# Patient Record
Sex: Female | Born: 1940 | Race: White | Hispanic: No | Smoking: Former smoker
Health system: Southern US, Community
[De-identification: ages and names within clinical notes are randomized; demographics above are authoritative.]

## PROBLEM LIST (undated history)

## (undated) DIAGNOSIS — Z98811 Dental restoration status: Secondary | ICD-10-CM

## (undated) DIAGNOSIS — C801 Malignant (primary) neoplasm, unspecified: Secondary | ICD-10-CM

## (undated) DIAGNOSIS — E7800 Pure hypercholesterolemia, unspecified: Secondary | ICD-10-CM

## (undated) DIAGNOSIS — Z923 Personal history of irradiation: Secondary | ICD-10-CM

## (undated) DIAGNOSIS — C50919 Malignant neoplasm of unspecified site of unspecified female breast: Secondary | ICD-10-CM

## (undated) DIAGNOSIS — M545 Low back pain, unspecified: Secondary | ICD-10-CM

## (undated) DIAGNOSIS — M542 Cervicalgia: Secondary | ICD-10-CM

## (undated) DIAGNOSIS — L719 Rosacea, unspecified: Secondary | ICD-10-CM

## (undated) DIAGNOSIS — T4145XA Adverse effect of unspecified anesthetic, initial encounter: Secondary | ICD-10-CM

## (undated) DIAGNOSIS — M1990 Unspecified osteoarthritis, unspecified site: Secondary | ICD-10-CM

## (undated) DIAGNOSIS — H9390 Unspecified disorder of ear, unspecified ear: Secondary | ICD-10-CM

## (undated) DIAGNOSIS — M546 Pain in thoracic spine: Secondary | ICD-10-CM

## (undated) DIAGNOSIS — K219 Gastro-esophageal reflux disease without esophagitis: Secondary | ICD-10-CM

## (undated) DIAGNOSIS — M79602 Pain in left arm: Secondary | ICD-10-CM

## (undated) DIAGNOSIS — R350 Frequency of micturition: Secondary | ICD-10-CM

## (undated) HISTORY — DX: Pain in thoracic spine: M54.6

## (undated) HISTORY — DX: Rosacea, unspecified: L71.9

## (undated) HISTORY — PX: TONSILLECTOMY: SUR1361

## (undated) HISTORY — PX: CERVICAL FUSION: SHX112

## (undated) HISTORY — DX: Low back pain, unspecified: M54.50

## (undated) HISTORY — PX: REPLACEMENT TOTAL KNEE: SUR1224

## (undated) HISTORY — DX: Low back pain: M54.5

## (undated) HISTORY — DX: Cervicalgia: M54.2

## (undated) HISTORY — PX: ROTATOR CUFF REPAIR: SHX139

## (undated) HISTORY — DX: Pure hypercholesterolemia, unspecified: E78.00

## (undated) HISTORY — DX: Pain in left arm: M79.602

## (undated) HISTORY — PX: JOINT REPLACEMENT: SHX530

---

## 1968-11-21 HISTORY — PX: ABDOMINAL HYSTERECTOMY: SHX81

## 2000-01-01 ENCOUNTER — Emergency Department (HOSPITAL_COMMUNITY): Admission: EM | Admit: 2000-01-01 | Discharge: 2000-01-01 | Payer: Self-pay | Admitting: Emergency Medicine

## 2000-01-01 ENCOUNTER — Encounter: Payer: Self-pay | Admitting: Emergency Medicine

## 2000-09-29 ENCOUNTER — Encounter: Admission: RE | Admit: 2000-09-29 | Discharge: 2000-09-29 | Payer: Self-pay | Admitting: Gastroenterology

## 2000-09-29 ENCOUNTER — Encounter: Payer: Self-pay | Admitting: Gastroenterology

## 2001-04-11 ENCOUNTER — Encounter: Admission: RE | Admit: 2001-04-11 | Discharge: 2001-07-10 | Payer: Self-pay | Admitting: Family Medicine

## 2003-07-11 ENCOUNTER — Ambulatory Visit (HOSPITAL_COMMUNITY): Admission: RE | Admit: 2003-07-11 | Discharge: 2003-07-11 | Payer: Self-pay | Admitting: Internal Medicine

## 2003-07-11 ENCOUNTER — Encounter: Payer: Self-pay | Admitting: Internal Medicine

## 2003-09-23 ENCOUNTER — Ambulatory Visit (HOSPITAL_COMMUNITY): Admission: RE | Admit: 2003-09-23 | Discharge: 2003-09-23 | Payer: Self-pay | Admitting: Family Medicine

## 2004-05-17 ENCOUNTER — Ambulatory Visit (HOSPITAL_COMMUNITY): Admission: RE | Admit: 2004-05-17 | Discharge: 2004-05-17 | Payer: Self-pay | Admitting: Internal Medicine

## 2004-07-30 ENCOUNTER — Ambulatory Visit: Payer: Self-pay | Admitting: Family Medicine

## 2004-08-03 ENCOUNTER — Ambulatory Visit: Payer: Self-pay | Admitting: Family Medicine

## 2004-08-13 ENCOUNTER — Encounter: Admission: RE | Admit: 2004-08-13 | Discharge: 2004-08-13 | Payer: Self-pay | Admitting: Family Medicine

## 2004-08-25 ENCOUNTER — Ambulatory Visit: Payer: Self-pay | Admitting: *Deleted

## 2004-10-18 ENCOUNTER — Ambulatory Visit: Payer: Self-pay | Admitting: Family Medicine

## 2004-10-19 ENCOUNTER — Ambulatory Visit (HOSPITAL_COMMUNITY): Admission: RE | Admit: 2004-10-19 | Discharge: 2004-10-19 | Payer: Self-pay | Admitting: Internal Medicine

## 2004-10-22 ENCOUNTER — Ambulatory Visit: Payer: Self-pay | Admitting: Family Medicine

## 2004-11-02 ENCOUNTER — Ambulatory Visit: Payer: Self-pay | Admitting: Family Medicine

## 2004-11-17 ENCOUNTER — Ambulatory Visit (HOSPITAL_BASED_OUTPATIENT_CLINIC_OR_DEPARTMENT_OTHER): Admission: RE | Admit: 2004-11-17 | Discharge: 2004-11-17 | Payer: Self-pay | Admitting: Orthopedic Surgery

## 2004-11-17 ENCOUNTER — Ambulatory Visit (HOSPITAL_COMMUNITY): Admission: RE | Admit: 2004-11-17 | Discharge: 2004-11-17 | Payer: Self-pay | Admitting: Orthopedic Surgery

## 2004-11-21 DIAGNOSIS — T8859XA Other complications of anesthesia, initial encounter: Secondary | ICD-10-CM

## 2004-11-21 HISTORY — DX: Other complications of anesthesia, initial encounter: T88.59XA

## 2004-11-26 ENCOUNTER — Encounter: Admission: RE | Admit: 2004-11-26 | Discharge: 2004-12-15 | Payer: Self-pay | Admitting: Orthopedic Surgery

## 2005-01-19 ENCOUNTER — Ambulatory Visit (HOSPITAL_COMMUNITY): Admission: RE | Admit: 2005-01-19 | Discharge: 2005-01-19 | Payer: Self-pay | Admitting: Orthopedic Surgery

## 2005-01-25 ENCOUNTER — Ambulatory Visit: Payer: Self-pay | Admitting: Family Medicine

## 2005-02-09 ENCOUNTER — Ambulatory Visit (HOSPITAL_BASED_OUTPATIENT_CLINIC_OR_DEPARTMENT_OTHER): Admission: RE | Admit: 2005-02-09 | Discharge: 2005-02-09 | Payer: Self-pay | Admitting: Orthopedic Surgery

## 2005-03-02 ENCOUNTER — Encounter: Admission: RE | Admit: 2005-03-02 | Discharge: 2005-05-18 | Payer: Self-pay | Admitting: Orthopedic Surgery

## 2005-05-02 ENCOUNTER — Ambulatory Visit: Payer: Self-pay | Admitting: Family Medicine

## 2005-05-09 ENCOUNTER — Emergency Department (HOSPITAL_COMMUNITY): Admission: EM | Admit: 2005-05-09 | Discharge: 2005-05-10 | Payer: Self-pay | Admitting: *Deleted

## 2005-05-10 ENCOUNTER — Ambulatory Visit: Payer: Self-pay | Admitting: Family Medicine

## 2005-06-28 ENCOUNTER — Ambulatory Visit (HOSPITAL_COMMUNITY): Admission: RE | Admit: 2005-06-28 | Discharge: 2005-06-28 | Payer: Self-pay | Admitting: Gastroenterology

## 2005-09-14 ENCOUNTER — Encounter: Admission: RE | Admit: 2005-09-14 | Discharge: 2005-09-14 | Payer: Self-pay | Admitting: Internal Medicine

## 2005-09-20 ENCOUNTER — Encounter: Admission: RE | Admit: 2005-09-20 | Discharge: 2005-09-20 | Payer: Self-pay | Admitting: Internal Medicine

## 2006-01-02 ENCOUNTER — Inpatient Hospital Stay (HOSPITAL_COMMUNITY): Admission: RE | Admit: 2006-01-02 | Discharge: 2006-01-07 | Payer: Self-pay | Admitting: Orthopedic Surgery

## 2006-04-19 ENCOUNTER — Encounter: Admission: RE | Admit: 2006-04-19 | Discharge: 2006-04-19 | Payer: Self-pay | Admitting: Internal Medicine

## 2006-09-27 ENCOUNTER — Encounter: Admission: RE | Admit: 2006-09-27 | Discharge: 2006-09-27 | Payer: Self-pay | Admitting: Gastroenterology

## 2006-10-24 ENCOUNTER — Encounter: Admission: RE | Admit: 2006-10-24 | Discharge: 2006-10-24 | Payer: Self-pay | Admitting: Internal Medicine

## 2007-10-12 ENCOUNTER — Encounter: Admission: RE | Admit: 2007-10-12 | Discharge: 2007-10-12 | Payer: Self-pay | Admitting: Internal Medicine

## 2007-12-31 ENCOUNTER — Encounter: Admission: RE | Admit: 2007-12-31 | Discharge: 2008-02-01 | Payer: Self-pay | Admitting: Sports Medicine

## 2008-05-06 ENCOUNTER — Encounter: Admission: RE | Admit: 2008-05-06 | Discharge: 2008-05-06 | Payer: Self-pay | Admitting: Sports Medicine

## 2008-05-17 ENCOUNTER — Encounter: Admission: RE | Admit: 2008-05-17 | Discharge: 2008-05-17 | Payer: Self-pay | Admitting: Sports Medicine

## 2008-07-11 ENCOUNTER — Encounter
Admission: RE | Admit: 2008-07-11 | Discharge: 2008-07-14 | Payer: Self-pay | Admitting: Physical Medicine and Rehabilitation

## 2008-07-14 ENCOUNTER — Ambulatory Visit: Payer: Self-pay | Admitting: Physical Medicine and Rehabilitation

## 2008-07-14 ENCOUNTER — Encounter: Admission: RE | Admit: 2008-07-14 | Discharge: 2008-10-12 | Payer: Self-pay | Admitting: Anesthesiology

## 2008-07-15 ENCOUNTER — Ambulatory Visit: Payer: Self-pay | Admitting: Anesthesiology

## 2008-07-29 ENCOUNTER — Ambulatory Visit: Payer: Self-pay | Admitting: Anesthesiology

## 2008-08-26 ENCOUNTER — Ambulatory Visit: Payer: Self-pay | Admitting: Anesthesiology

## 2008-09-23 ENCOUNTER — Ambulatory Visit: Payer: Self-pay | Admitting: Anesthesiology

## 2008-10-21 ENCOUNTER — Encounter
Admission: RE | Admit: 2008-10-21 | Discharge: 2009-01-19 | Payer: Self-pay | Admitting: Physical Medicine and Rehabilitation

## 2008-10-22 ENCOUNTER — Ambulatory Visit: Payer: Self-pay | Admitting: Physical Medicine and Rehabilitation

## 2008-11-03 ENCOUNTER — Encounter: Admission: RE | Admit: 2008-11-03 | Discharge: 2008-11-04 | Payer: Self-pay | Admitting: Anesthesiology

## 2008-11-04 ENCOUNTER — Ambulatory Visit: Payer: Self-pay | Admitting: Anesthesiology

## 2008-12-05 ENCOUNTER — Ambulatory Visit: Payer: Self-pay | Admitting: Physical Medicine and Rehabilitation

## 2008-12-05 ENCOUNTER — Ambulatory Visit (HOSPITAL_COMMUNITY)
Admission: RE | Admit: 2008-12-05 | Discharge: 2008-12-05 | Payer: Self-pay | Admitting: Physical Medicine and Rehabilitation

## 2008-12-25 ENCOUNTER — Encounter: Admission: RE | Admit: 2008-12-25 | Discharge: 2008-12-25 | Payer: Self-pay | Admitting: Internal Medicine

## 2009-01-09 ENCOUNTER — Ambulatory Visit: Payer: Self-pay | Admitting: Physical Medicine and Rehabilitation

## 2009-01-14 ENCOUNTER — Encounter
Admission: RE | Admit: 2009-01-14 | Discharge: 2009-03-26 | Payer: Self-pay | Admitting: Physical Medicine and Rehabilitation

## 2009-02-10 ENCOUNTER — Encounter
Admission: RE | Admit: 2009-02-10 | Discharge: 2009-05-11 | Payer: Self-pay | Admitting: Physical Medicine and Rehabilitation

## 2009-02-12 ENCOUNTER — Ambulatory Visit: Payer: Self-pay | Admitting: Physical Medicine and Rehabilitation

## 2009-04-02 ENCOUNTER — Ambulatory Visit: Payer: Self-pay | Admitting: Physical Medicine and Rehabilitation

## 2009-05-26 ENCOUNTER — Encounter
Admission: RE | Admit: 2009-05-26 | Discharge: 2009-07-24 | Payer: Self-pay | Admitting: Physical Medicine and Rehabilitation

## 2009-05-29 ENCOUNTER — Ambulatory Visit: Payer: Self-pay | Admitting: Physical Medicine and Rehabilitation

## 2009-07-24 ENCOUNTER — Ambulatory Visit: Payer: Self-pay | Admitting: Physical Medicine and Rehabilitation

## 2009-10-14 ENCOUNTER — Encounter
Admission: RE | Admit: 2009-10-14 | Discharge: 2009-11-11 | Payer: Self-pay | Admitting: Physical Medicine and Rehabilitation

## 2009-10-19 ENCOUNTER — Ambulatory Visit: Payer: Self-pay | Admitting: Physical Medicine and Rehabilitation

## 2010-02-11 ENCOUNTER — Encounter
Admission: RE | Admit: 2010-02-11 | Discharge: 2010-02-15 | Payer: Self-pay | Admitting: Physical Medicine and Rehabilitation

## 2010-02-15 ENCOUNTER — Ambulatory Visit: Payer: Self-pay | Admitting: Physical Medicine and Rehabilitation

## 2010-05-28 ENCOUNTER — Encounter
Admission: RE | Admit: 2010-05-28 | Discharge: 2010-06-07 | Payer: Self-pay | Admitting: Physical Medicine and Rehabilitation

## 2010-06-07 ENCOUNTER — Ambulatory Visit: Payer: Self-pay | Admitting: Physical Medicine and Rehabilitation

## 2010-08-26 ENCOUNTER — Encounter
Admission: RE | Admit: 2010-08-26 | Discharge: 2010-09-03 | Payer: Self-pay | Admitting: Physical Medicine & Rehabilitation

## 2010-09-01 ENCOUNTER — Ambulatory Visit: Payer: Self-pay | Admitting: Physical Medicine and Rehabilitation

## 2010-09-01 ENCOUNTER — Encounter
Admission: RE | Admit: 2010-09-01 | Discharge: 2010-09-01 | Payer: Self-pay | Source: Home / Self Care | Attending: Physical Medicine and Rehabilitation | Admitting: Physical Medicine and Rehabilitation

## 2011-02-23 ENCOUNTER — Encounter: Payer: PRIVATE HEALTH INSURANCE | Attending: Neurosurgery | Admitting: Neurosurgery

## 2011-02-23 DIAGNOSIS — M545 Low back pain, unspecified: Secondary | ICD-10-CM | POA: Insufficient documentation

## 2011-02-23 DIAGNOSIS — M5430 Sciatica, unspecified side: Secondary | ICD-10-CM

## 2011-02-23 DIAGNOSIS — Z79899 Other long term (current) drug therapy: Secondary | ICD-10-CM | POA: Insufficient documentation

## 2011-02-23 DIAGNOSIS — IMO0001 Reserved for inherently not codable concepts without codable children: Secondary | ICD-10-CM | POA: Insufficient documentation

## 2011-02-23 DIAGNOSIS — M76899 Other specified enthesopathies of unspecified lower limb, excluding foot: Secondary | ICD-10-CM

## 2011-02-23 DIAGNOSIS — M47817 Spondylosis without myelopathy or radiculopathy, lumbosacral region: Secondary | ICD-10-CM

## 2011-03-01 ENCOUNTER — Other Ambulatory Visit: Payer: Self-pay | Admitting: Physical Medicine and Rehabilitation

## 2011-03-01 DIAGNOSIS — M47816 Spondylosis without myelopathy or radiculopathy, lumbar region: Secondary | ICD-10-CM

## 2011-03-01 DIAGNOSIS — M549 Dorsalgia, unspecified: Secondary | ICD-10-CM

## 2011-03-04 ENCOUNTER — Ambulatory Visit (HOSPITAL_COMMUNITY)
Admission: RE | Admit: 2011-03-04 | Discharge: 2011-03-04 | Disposition: A | Discharge status: Home / Self Care | Payer: PRIVATE HEALTH INSURANCE | Source: Ambulatory Visit | Attending: Physical Medicine and Rehabilitation | Admitting: Physical Medicine and Rehabilitation

## 2011-03-04 ENCOUNTER — Other Ambulatory Visit: Payer: Self-pay | Admitting: Physical Medicine and Rehabilitation

## 2011-03-04 DIAGNOSIS — M545 Low back pain, unspecified: Secondary | ICD-10-CM | POA: Insufficient documentation

## 2011-03-04 DIAGNOSIS — M47816 Spondylosis without myelopathy or radiculopathy, lumbar region: Secondary | ICD-10-CM

## 2011-03-04 DIAGNOSIS — M549 Dorsalgia, unspecified: Secondary | ICD-10-CM

## 2011-03-04 DIAGNOSIS — M47817 Spondylosis without myelopathy or radiculopathy, lumbosacral region: Secondary | ICD-10-CM | POA: Insufficient documentation

## 2011-03-04 DIAGNOSIS — IMO0001 Reserved for inherently not codable concepts without codable children: Secondary | ICD-10-CM | POA: Insufficient documentation

## 2011-03-04 DIAGNOSIS — M5137 Other intervertebral disc degeneration, lumbosacral region: Secondary | ICD-10-CM | POA: Insufficient documentation

## 2011-03-04 DIAGNOSIS — M51379 Other intervertebral disc degeneration, lumbosacral region without mention of lumbar back pain or lower extremity pain: Secondary | ICD-10-CM | POA: Insufficient documentation

## 2011-03-23 ENCOUNTER — Ambulatory Visit: Payer: PRIVATE HEALTH INSURANCE | Admitting: Neurosurgery

## 2011-03-24 ENCOUNTER — Encounter: Payer: PRIVATE HEALTH INSURANCE | Attending: Neurosurgery | Admitting: Neurosurgery

## 2011-03-24 DIAGNOSIS — M545 Low back pain, unspecified: Secondary | ICD-10-CM | POA: Insufficient documentation

## 2011-03-24 DIAGNOSIS — M5430 Sciatica, unspecified side: Secondary | ICD-10-CM

## 2011-03-24 DIAGNOSIS — M76899 Other specified enthesopathies of unspecified lower limb, excluding foot: Secondary | ICD-10-CM | POA: Insufficient documentation

## 2011-03-24 DIAGNOSIS — IMO0001 Reserved for inherently not codable concepts without codable children: Secondary | ICD-10-CM | POA: Insufficient documentation

## 2011-03-24 NOTE — Assessment & Plan Note (Signed)
[name] is a patient I saw on February 23, 2011, for her low back pain. She had been treated with Dr. Pamelia Hoit for sometime for low back and buttock pain, but had not been seen in quite sometime.  She was continuing her tramadol and Mobic and she actually states it helps, but most pain medicines cause her to itch, she takes it sparingly.  At that time, her pain level had increased somewhat.  She felt like that things had worsened and requested an MRI, so we did obtain an MRI of the lumbar spine.  We discussed that today.  Impression was negative for canal foraminal stenosis which she was glad to hear.  She has mild to moderate facet arthropathy at L4-5 and L5-S1.  Mildly progressed since her last MRI in 2009.  I did explain to the patient that we could possibly schedule her with Dr. Wynn Banker for some facet blocks, but she declined due to the fact she had those in the past and they were extremely painful.  She is going to continue her tramadol for the time being and since the MRI is not that progressively worse she is going to try to maintain conservative treatment without injection.  Her average pain today is a 7-8.  Her activity level is about 6 to an 8. Pain is worse in the evening with sitting and standing and some activities.  Ice and medicine tend to help.  Sleep patterns are fair. She walks without assistance.  She can walk about 60 minutes or so, then has problems afterwards.  She can climb steps.  She drives.  She is retired.  REVIEW OF SYSTEMS:  Notable for only some tingling in her thigh on the left, otherwise is within normal limits.  MRI was reviewed with the patient with the above outcome.  PAST MEDICAL HISTORY:  Unchanged.  SOCIAL HISTORY:  She is divorced.  She lives by herself.  PHYSICAL EXAMINATION:  Blood pressure 116/71, pulse is 70, respirations are 18, and O2 sats 97.  Her motor strength 5/5 in the lower extremities bilaterally.  Sensation is intact to  pinprick throughout the lower extremities.  She can flex and extend without any difficulty. Constitutionally, she is within normal limits.  She is alert and oriented x3.  Her affect is bright and alert.  Gait is normal.  ASSESSMENT: 1. Some history of greater trochanter bursitis, left greater than     right. 2. Low back pain with left radicular pain into the buttock.  PLAN: 1. We again reviewed the MRI and talked about injection, she declined. 2. We will start her on some Robaxin 500 mg 1-2 a day, gave her #60     with a refill and explained to her that this may help.  She is in     agreement with that since post pain medicines bother her.  She is     requested to follow up with me in 1 month.  We set that up.  We     will see her in 1 month or if she needs anything in the interim she     will give [place] a call.     Sabrina Sullivan   RLW/MedQ D:  03/24/2011 10:49:26  T:  03/24/2011 22:40:22  Job #:  [id number]

## 2011-04-05 NOTE — Assessment & Plan Note (Signed)
Sabrina Sullivan is a 70 year old woman who has multiple pain complaints.  She  has known lumbar facet arthropathy, possible sacroiliac joint  involvement, right knee replacement, and history of significant fall  onto her left gluteal area several years ago, which she feels is  partially related to current pain complaints.   In the interim, she has undergone a lumbar epidural steroid injection in  September 2009, a sacroiliac joint injection in October 2009, and medial  branch block of the lumbar spine in November 2009.   She believes that the medial branch blocks, which were done last month  have given her the most relief, and she is interested in pursuing  possible repeat of these versus radiofrequency.   She states, many years ago, she underwent some physical therapy, had 1  session of massage and ultrasound to the left gluteal region which  seemed to help but she did not undergo more than 1 session of this.  She  believes that at the time of her initial injury, she had a fairly  significant hematoma, which eventually did resolve.   Her average pain is about 7-8 on a scale of 10.  Pain currently in this  clinic is about 4 on a scale of 10.  Pain is localized to multiple  areas, 1 is across the low back, possibly a little bit worse on the left  than on the right.  She also has an area within the left buttock, which  is tender even to palpation and another area which burns and aches in  the left posterior thigh region just distal to the buttock.   Pain is typically worse with sitting, improves with rest and ice, and  she has noted some improvement in her back pain with the medial branch  block.   She reports very little relief with current meds.  She found that the  hydrocodone gave her itching and Benadryl made her too tired, and she  has discontinued taking Norco.  She also tried Cymbalta but felt that it  made her constipation worse.  She does suffer from diverticulitis and  has  discontinued her Cymbalta as well in the last month on her own.   Her functional status is as follows:  She can walk a variable amount of  time.  She does climb stairs and drives.  High-functioning individual,  independent with feeding, dressing, bathing, and toileting.   Denies problems controlling bowel or bladder.  Denies numbness,  tingling, tremors, depression, anxiety, or suicidal ideation.   REVIEW OF SYSTEMS:  Otherwise, negative.   No other changes in past medical, social, or family history since her  last visit with me which was about 3 months ago.   PHYSICAL EXAMINATION:  VITAL SIGNS:  Blood pressure is 147/88, pulse 68,  respirations 18, and 99% saturated on room air.  GENERAL:  She is a well-developed, well-nourished woman who does not  appear in any distress.  She is talkative.  She is oriented x3.  Speech  is clear.  Her affect is bright.  She is alert and cooperative.  She  answers questions appropriately and follows commands without difficulty.   Cranial nerves and coordination are grossly intact.  Her reflexes are 2+  at the patellar and Achillis tendons.  No abnormal tone is noted.  No  clonus is noted.  No tremors are noted.   Sensation is intact to light touch in the lower extremities.  Straight  leg raise was negative.   She  has a fairly well-preserved range of motion in the lumbar spine with  respect to forward flexion, extension, and lateral flexion.  She does  report some increased pain when she rotates and hyperextends at the same  time.  She finds this pain to be somewhat sharp in the low back region  when she does this.   Lower extremity manual muscle testing of hip extensors reveals good  strength bilaterally without exacerbating pain.  Hip abductors are  tested bilaterally and are again 5/5 without aggravation of her pain.  Her hip flexors, knee extensors, and dorsiflexors as well as plantar  flexors are all 5/5 without focal weakness.   Her gait  is evaluated.  She has a normal stride length, which is  symmetric.  No evidence of antalgia is appreciated.  She has a normal  heel-toe mechanic and a normal basal support.   IMPRESSION:  1. Lumbar spondylosis.  2. Lumbar facet arthropathy.  3. Possible sacroiliac involvement.  4. Suggestion of gluteal muscle dysfunction on the left, pain with      palpation is noted over this area.  5. Status post right knee replacement.   PLAN:  She is interested in decreasing the amount of benzodiazepines she  takes on a daily basis.  She is currently taking Xanax 0.5 two tablets  at bedtime.  She will reduce this to 1-1/2 tablets at bedtime.  We will  add Neurontin 300 mg 1 p.o. at bedtime for 3 days and 2 p.o. at bedtime.  Anticipate possibly titrating her up, further on Neurontin at her next  visit if she tolerates this.  She has tried Lyrica in the past,  apparently at a higher dose and she did not tolerate it and has concerns  about taking Lyrica at this time because it did disorient her somewhat.   She is interested in pursing a medial branch block, possibly  radiofrequency with Dr. Stevphen Sullivan as she feels that the last injection did  seem to help her.   At our next visit, we have also discussed getting her into a physical  therapy program to address some soft tissue mobilization in the left  gluteal musculature as well as ultrasound.  She seems interested in that  as well.  I will see her back in 6 weeks, and she will be scheduled to  follow up with Dr. Stevphen Sullivan at his next available opening.           ______________________________  Sabrina Sullivan, M.D.     DMK/MedQ  D:  10/22/2008 13:50:49  T:  10/23/2008 04:06:11  Job #:  [id number]

## 2011-04-05 NOTE — Procedures (Signed)
Sabrina Sullivan, Sabrina Sullivan                  ACCOUNT NO.:  [id number]   MEDICAL RECORD NO.:  [id number]           PATIENT TYPE:   LOCATION:                                 FACILITY:   PHYSICIAN:  Celene Kras, MD        DATE OF BIRTH:  1941-07-10   DATE OF PROCEDURE:  DATE OF DISCHARGE:                               OPERATIVE REPORT   [facility].  I  evaluated her via health and history form 14-point review of systems.   Gerilyn's pain is both SI and facetal in nature, and she has less leg  pain.  I think we are making progress today, but she is still very  careful, as she is frustrated, she is not making great progress, but  again I reassure her that leg pain improvement is a positive move  forward.  To this, I am going to address the spondylitic pain, examine  historical features suggest more so from the facet.  Then, the actual SI  component, so I am going to go ahead and proceed with medial branch  intervention, right and left side rationales to improve function and  quality of life.  She will assess within context of activities of daily  living and we will want to minimize escalation of controlled substances.  We would also consider RF as an option.  1. Other modifiable features in health profile discussed.  I am going      to start her on Cymbalta as well.  I think that will probably help      her symptomatically.  2. Questions were answered and discussed in lay terms.  I used models.   Objectively, diffuse paralumbar myofascial pain, Fortin test positive,  side bending, range of motion impaired secondary to pain and pain on  extension.  I do not see anything new from a neurological perspective  spondylitic, minimal myelopathy, the leg pain has improved.   IMPRESSION:  Spondylosis, just positive lumbar spine.   PLAN:  Facet medial branch intervention, right and left side 5, 4, 3,  and 2 with contributory innervation addressed,  independent needle access  points, under local anesthetic.  She is consented for today's procedure.  Consider sequential intervention.   The patient was taken to the fluoroscopy  suite and placed in the prone  position.  Back prepped and draped in the usual fashion, using a 22-  gauge spinal needle, I advanced facet medial branch at L5, L4, L3, and  L2, independent needle access points under local anesthetic, lumbar  right and left.  Confirmed placement.  I then inject 1 mL of Marcaine  0.25% MPF at each level, with a total of 40 mg Aristocort divided dose.   Tolerated the procedure well.  No complications from our procedure.  Appropriate recovery.  Discharge instructions given.  I will see her in  followup.            ______________________________  Celene Kras, MD     HH/MEDQ  D:  09/23/2008 14:14:28  T:  09/24/2008 02:10:12  Job:  [id number]

## 2011-04-05 NOTE — Assessment & Plan Note (Signed)
Sabrina Sullivan is a patient of Dr. Cleophas Dunker.  She is a 70 year old woman, who  has a long history of chronic buttock pain.   She has known lumbar spondylosis, facet arthropathy.  Her x-rays done in  the last year or two that has been shown, she has mild left hip  osteoarthritis as well as mild sacroiliac degenerative joint disease.  She has a right knee replacement and is felt to have some myofascial  gluteal muscular dysfunction.   She is back in today after participating in the physical therapy program  to address soft tissue range of motion and strength deficits in the  gluteal musculature.  She has also had some soft tissue work done by the  physical therapist.   Sabrina Sullivan states that she has found that the physical therapy program to  be beneficial.   She had been on some Ultracet.  She believes it caused her some itching.  She believes that her average pain level went from down 8 to about six  with the physical therapy intervention.  She had her 10th visit with  physical therapy yesterday.   Pain continues to be localized to the low back radiating through  buttocks, predominantly left to the posterior thigh region.  Pain is  worse with sitting, improves with rest, rest, ice and physical therapy  and injections in the past.  She gets a little relief from current meds.   Medications prescribed through this clinic have included Ultracet 1-2  p.o. b.i.d.  She found this to cause some itching.  She did find the  Mobic 7.5 once a day on a p.r.n. basis #15, last month to be of also  some benefit.   No changes in mobility or functional status.  She has had no changes in  review of system, past medical, social, and family history since last  visit, she was last seen on January 09, 2009.   On exam; her blood pressure is 113/80, pulse 61, respiration 18, and  100% saturated on room air.   She is a well-developed, well-nourished woman who appears her stated age  and does not appear in  any distress.   She is oriented x3.  Her speech is clear.  Her affect is bright.  She is  alert, cooperative, and pleasant.  Follows commands without difficulty.  Answers questions appropriately.   Coordination is intact.  Her reflexes are symmetric and intact at  patellar and Achilles tendon.  No abnormal tone is noted.  No clonus is  noted.  No tremors are appreciated.  Normal muscle bulk is appreciated.   No sensory deficits are noted.  Motor strength is 4+/5 for hip extension  bilaterally, 4+ to 5/5 bilateral hip flexors, 5/5 at knee extensors and  flexors, 5/5 at ankle dorsi and plantar flexors.   Straight leg raise is negative.  She has quite a bit of tenderness with  palpation over both right and left sacroiliac joint, worse on the left  than on the right however.  Internal-external rotation at her hips does  not increase any groin pain.  She does complain of some discomfort at  posterior hip region, especially on left.   Her gait is normal.  She has normal stride length, normal base of  support.   She has mild limitations in lumbar motion.  Romberg test and tandem gait  are all performed adequately.   IMPRESSION:  1. History of lumbar spondylosis.  2. Lumbar facet arthropathy.  3. Known sacroiliac  joint, mild away.  4. Known left hip mild degenerative changes.  5. 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  6. Status post right knee replacement.   PLAN:  We would like her to continue physical therapy, so far main work  had been with deep tissue massage and addressing trigger points.  She  has not started range of motion work as well as [employer].  We will write therapy orders today to continue along these lines and  would like a home program provided as well.   We will prescribe Mobic 7.5 one p.o. daily x7 days and p.r.n. #20 with a  refill.  I will see her back in 6-8 weeks.           ______________________________  Brantley Stage,  M.D.     DMK/MedQ  D:  02/12/2009 11:55:52  T:  02/13/2009 00:01:05  Job #:  [id number]

## 2011-04-05 NOTE — Assessment & Plan Note (Signed)
[name] is a 70 year old woman, who is followed in our [facility] for chronic low back pain and left posterior hip  pain.  She is back in today for a brief recheck.  She has been engaging  in physical therapy program over the last several weeks, continues to  work on a home program.  She states that she her leg pain is in  different places now.  She finds that physical therapy has been quite  helpful, average pain scores have decreased, although she does have some  pain in a different place.  Further questioning, she reports she had  been doing rather well up until she had done some dancing, did some  classic rock and roll with her children and developed some increased low  back pain after engaging this particular activity.   Pain is typically worse with walking, standing, even turning in bed at  night bothers her, pain improves with rest and ice.  She reports fair  relief with current meds.   Medications from this clinic include Ultracet 1-2 p.o. b.i.d. and Mobic  7.5 daily, which she does not typically take on a daily basis.   She is independent with all of her activities with respect to activities  of daily living.  She is independent with mobility.  She can walk at  least an half hour at a time.  Denies problems with controlling bowel or  bladder.  Denies any new numbness, tingling, or weakness.  Denies any  problems with trouble walking, dizziness, confusion, depression,  anxiety, or suicidal ideation.   Review of systems is otherwise noncontributory.  No changes in past  medical, social, or family history since our last visit other than an  endoscopy, which was done on May 14, 2009.   Exam today, blood pressure is 122/71, pulse 67, respiration 89, and 97%  saturated on room air.  She is well-developed, well-nourished female,  who appears somewhat younger than her stated age.  She is oriented x3.  Speech is clear.  Affect is bright.  She is alert,  cooperative, and  pleasant.  Follows commands without difficulty.  Answers my questions  appropriately.   Cranial nerves and coordination are intact.  Reflexes are 2+ at patellar  and Achilles tendons.  No abnormal tone is noted.  No clonus is noted.  No tremors are appreciated.   No new sensory deficits are noted.  Motor strength 5/5 at hip flexors,  knee extensors, dorsiflexors, plantar flexors, and EHL.   Transitioning from sitting to standing is done without difficulty.  Gait  is normal.  Tandem gait and Romberg test are performed adequately.   In the prone position testing, hip extension, she did complain of some  pain in the low lumbar region when her right lower extremity was tested  and did not bother as much as left lower extremity extension as she was  standing, lumbar extension also provoke some lower back pain.   IMPRESSION:  1. Lumbar strain injury most likely facet mediated after dancing to      rock and roll with children.  2. History of lumbar facet arthropathy.  3. Known sacroiliac joint arthritis and mild osteoarthritis.  4. Mild left hip degenerative changes.  5. 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  6. Status post right knee replacement.   PLAN:  We will continue to have her use Ultram 1-2 tablets up to twice a  day for back and hip pain.   RECOMMENDATIONS:  She can continue to take Mobic on a daily basis for  the next 7-10 days and I have been discouraged her to engage in  activities, which may put her into more lumbar extension.  We reviewed  this.  I reviewed activities that she should avoid over the next several  weeks.  After she has had some rest over the next 2 weeks with  nonsteroidals would have her slowly return back to some gentle lower  extremity stretching program, which she has engaged in the past and  continues to do some walking on a daily basis.  We will see her back  then in 2 months.            ______________________________  Brantley Stage, M.D.     DMK/MedQ  D:  05/29/2009 11:31:06  T:  05/30/2009 00:55:08  Job #:  [id number]   cc:   Melina Fiddler, MD

## 2011-04-05 NOTE — Procedures (Signed)
NAME:  Sabrina Sullivan, Sabrina Sullivan                  ACCOUNT NO.:  [id number]   MEDICAL RECORD NO.:  [id number]          PATIENT TYPE:  REC   LOCATION:  [facility]                          FACILITY:  [facility]   PHYSICIAN:  Celene Kras, MD        DATE OF BIRTH:  24-Jan-1941   DATE OF PROCEDURE:  07/15/2008  DATE OF DISCHARGE:                               OPERATIVE REPORT   [facility].  I evaluated  her and reviewed the History and Health form and 14-point review of  systems.  Reviewed MRI, progress to date overall, directed care  approach, functional impairment secondary to pain particularly with  provocative maneuvers bending, flexion, and side bending, left greater  than right.  1. MRI suggests spondylitic disease, it is reasonable diagnostic and      therapeutic to move forward with facet intervention with positive      predictive experience, plan is consideration of radiofrequency      neural ablation.  2. Modifiable features in health profile discussed.  Recommend      cigarette cessation for [place] outcome, and she will maintain contact      with Dr. Pamelia Hoit in this regard.  3. Follow up for second in series, if benchmarks are obtained.  I have      discussed this with her and she understands the expectations.      Risks, complications, and options are fully outlined.   Objectively, diffuse paralumbar fascial pain, Fortin test positive.  Side bending range of motion impaired secondary to pain.  Pain with  extension.  Her typical pain, spondylitic, minimal myelopathy, radicular  elements not evident.   IMPRESSION:  Spondylosis, lumbar.   PLAN:  1. Lumbar facet medial branch intervention, most problematic level 5,      4, and 3 with contributory innervation at 2 so will block 4 levels      to affect 3.  She has consented for today's procedures.  I have      used models and discussed in lay terms.  2. Maintain contact with Dr. Pamelia Hoit.  3. We will see  her in followup.  Questions were answered.   The patient was taken to the fluoroscopy suite and placed in prone  position.  Back was prepped and draped in usual fashion.  Using a 22-  gauge spinal needle, I advanced to the facet at its medial branch 5, 4,  3, and 2, contributory innervation addressed, left side, under local  anesthetic, independent needle access points confirmed placement.  I  then injected 1 mL of lidocaine with 1% MPF at each level with a total  of 40 mg of Aristocort in divided dose.   Tolerated the procedure well.  No complications identified.  Questions  were answered, discussed in lay terms and we will see her follow up.  I  do not necessarily plan another intervention, but we will see how she  does and consider reinforcement if benchmarks are obtained.          ______________________________  Celene Kras,  MD    HH/MEDQ  D:  07/15/2008 13:24:28  T:  07/16/2008 02:11:48  Job:  [id number]

## 2011-04-05 NOTE — Procedures (Signed)
NAMECLOVER, FEEHAN                  ACCOUNT NO.:  [id number]   MEDICAL RECORD NO.:  [id number]          PATIENT TYPE:  REC   LOCATION:  [facility]                          FACILITY:  [facility]   PHYSICIAN:  Celene Kras, MD        DATE OF BIRTH:  December 15, 1940   DATE OF PROCEDURE:  DATE OF DISCHARGE:                               OPERATIVE REPORT   1. Sabrina [facility] to [facility] today and is almost      tearful and she is in pain, unable to enjoy holidays or most      activities of daily living.  The blocks have helped her, and we      have isolated that the sequential interventions at the facet medial      branch were very helpful.  Particularly, the second block with      denser local anesthetic with appropriate response.  I think it is      probably reasonable we block with radiofrequency neural ablation,      to extend the release cycling, to further minimize escalation of      controlled substances, improve function, and quality of life.  Her      pain stops inferogluteal, does not go below the knee, the SI joint      did not give [place] the release cycling, I think we needed, and most      problematic level appears to be the left side.  We are going to go      on to the left side, risks, complications, options were fully      outlined.  Consider contralateral side if necessary.  I just think      this is most problematic side at 5 and 4, he will also      radiofrequency L3, to the left side as contributory innervation.      Risks, complications, and options are fully outlined.  Predicate      further intervention based on need and overall response.  She has      quit smoking.  She is very active engaging, and she is very      compliant.  [place] engaging, she really wants to avoid surgery,      and I am afraid that he would require basically a fusion for Satz      outcome.   1. Other modifiable features in health profile discussed.  I also      discussed home-based therapy  and other options here.   Objectively, diffuse paralumbar myofascial discomfort.  Fortin test  positive.  Modified Gaenslen's and Patrick's positive, left greater than  right.  It does not seem to be as pronounced on the left.  Pain with  extension and side bending.  Left greater than right pain.  She is  intact from a neurological perspective.   IMPRESSION:  Degenerative spine disease, lumbar spine with spondylosis,  sacral joint pain seemingly improved.   PLAN:  Facet medial branch radiofrequency neural ablation on the left  side 5, 4, and 3.  Independent  needle access points at the medial  branch, under local anesthetic.  Predicate further intervention based on  need and overall response.  Follow up within a month and see how she is  doing.  I used a 10-mm active tip.  I have reviewed the risks such as  bleeding, infection, nerve damage, stroke, seizure, or death and other  uncommon potential problems not commonly encountered such as  idiosyncratic reaction to medications, or even escalating in pain.  She  has consented for today's procedure.   The patient was taken to fluoroscopy suite and placed in prone position.  Back prepped and draped in usual fashion.  Using a 22-gauge RF needle, I  advanced the facet at the medial branch at 5, 4, and 3 left side under  local anesthetic, and she has consented.  Predicate confirmed placement  in multiple fluoroscopic positions, appropriately stimulate motor  sensory, then follow with reconfirmation and inject 1 mL of Marcaine  0.25% MPF at each level, with a total of 40 mg of Aristocort in divided  dose.   Lesion is performed at 60 degrees for 60 seconds at each level.   Tolerated procedure well.  No complications from our procedure.  Appropriate recovery.  Discharge instructions given.  No barrier to  communication.  We will see her in followup.           ______________________________  Celene Kras, MD     HH/MEDQ  D:  11/04/2008  16:36:58  T:  11/05/2008 05:25:35  Job:  [id number]

## 2011-04-05 NOTE — Procedures (Signed)
NAMEALYZAE, HAWKEY                  ACCOUNT NO.:  [id number]   MEDICAL RECORD NO.:  [id number]          PATIENT TYPE:  REC   LOCATION:  [facility]                          FACILITY:  [facility]   PHYSICIAN:  Celene Kras, MD        DATE OF BIRTH:  04/28/1941   DATE OF PROCEDURE:  07/29/2008  DATE OF DISCHARGE:                               OPERATIVE REPORT   Sabrina Sullivan comes into Pain Management today.  I evaluated her via health and  history form and a 14-point review of systems.   1. She had a fair to equivocal result from the facet intervention, and      she has a mixed left-to-right pattern, left predominant.  Some      radicular pain is evident, and she is starting to lateralize right      and left, and a mixed spondylitic pain, emerging radicular pain,      but modest.  It is reasonable to go to the central compartment,      consider nerve conduction studies for further diagnostics, but she      has a mixed anterior and posterior compartment pain.  I will go      ahead and block by lumbar epidural approach 5 and 1, reinforce the      facet to 5 and 4.  These are most problematic levels.  2. Other modifiable features in her profile discussed.  She has quit      smoking, this has been very positive for her, and she is not really      taking any medication.  She has some fears because her husband had      a drug problem, but as I relate to her legitimate medical need is      met, and she should use these to help with functional enhancements      and to improve her quality of life.  Sometimes tearful to the      discussion, we may need to send her to our psychologist as support,      and lot of reassurance was given.  I do think that our [place]      approach is to deal with the central compartment today, and follow      her expectantly.  If she has spondylitic pain as then evident, we      can go back and readdress the facet, consider RF.  That may be our      [place] nonsurgical position.  Surgical  colleagues have said she is      nonsurgical, and she will also maintain contact with our physiatry      colleagues.   Objectively, diffuse paralumbar myofascitis.  Fortin test positive.  Side bending pain with extension and range of motion activities,  particularly extension.  She does have a radicular pain, left greater  than right, side bending is evident, but nothing new neurologically.  EHL is fine.   IMPRESSION:  Degenerative spondylolisthesis of the lumbar spine with  back pain and leg pain.   PLAN:  Lumbar epidural  at L5-S1 with 5 and 4 to the left side at the  medial branch positive predictive experience discussed, our discussed  expectations at benchmarks, and contributory innervation reviewed.  She  is consented for today's procedure.   The patient was taken to fluoroscopy suite and placed in prone position.  The back was prepped and draped in usual fashion.  Using a Hustead  needle advanced to L5-S7 interspace without any evidence of CSF, heme,  or paresthesia.  Test block uneventfully followed 2 mL of preservative-  free 1% lidocaine.  30 mg of Aristocort.   We then addressed the facet at the medial branch, 5 and 4, independent  needle access points and inject 1 mL of lidocaine 1% MPF at each level  with 5 mg of Aristocort at each level.   She tolerated the procedure well.  Improved from the diminution pain  perception prospective.  The SI joint pain has improved, less  infragluteal pain.  She is able to get into the car and move around a  bit, and so I think we are making slow progress to a complex arthritic  back, with multiple pain generators will see her.  I would plan on  another intervention will see how she does.  Hydrocodone was prescribed,  precautions given.  Opioid consent __________were reviewed.  Another  rationale performed.  The intervention is to minimize escalation of  controlled substances.  Questions were answered.            ______________________________  Celene Kras, MD     HH/MEDQ  D:  07/29/2008 11:22:55  T:  07/30/2008 00:31:01  Job:  [id number]

## 2011-04-05 NOTE — Assessment & Plan Note (Signed)
Sabrina Sullivan is a 70 year old woman who has multiple pain complaints.  She  has been followed in [facility] for chronic low  back pain, buttock pain, and left posterior thigh pain.   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   She has undergone epidural steroid injection in September 2009,  sacroiliac joint injection in October 2009, and medial branch block and  lumbar spine in November 2009.  She recently underwent radiofrequency by  Dr. Stevphen Rochester on November 04, 2008.  She is overall very pleased with the  results, she states I am much much better, approximately 75% better.   Her lower back is much improved.  She still has complaints of pain when  she sits down and leans back slightly.  She describes this as rather  severe pain and when she does sit she needs to lean forward to be seated  for a prolonged period of time.   Her average pain is about 8 on a scale of 10, currently in clinic that  at 4.  Pain is described as burning.  She did not find the hydrocodone  particularly helpful and then asked the nursing staff to flush the rest  of her medications.  She brought in 74 pills left over.   FUNCTIONAL STATUS:  She can walk at least a half hour to an hour.  She  is independent with all of her self-care.  She does not use an assistive  device.   No changes in past medical, social, or family history since last visit.   REVIEW OF SYSTEMS:  Positive for some recent facial redness and flushing  and she will follow up with primary care for this.  She is wondering if  the facial flushing and redness may be a side effect of Neurontin.   MEDICATIONS:  Prescribed through this clinic currently include Neurontin  300 mg 2 tablets at night only.   PHYSICAL EXAMINATION:  VITAL SIGNS:  Blood pressure is 123/92, pulse 66,  respiration 18, and 97% saturated on room air.   GENERAL:  She is a well-developed, well-nourished woman who does not  appear in any distress and she appears her stated age.   NEUROLOGIC:  She is oriented x3.  Speech is clear.  Affect is bright.  She is alert, cooperative and pleasant.  Follows commands without  difficulty and answers all questions appropriately.   Cranial nerves are grossly intact.  Coordination is intact.  Reflexes  are 2+ at patellar and Achillis tendons.  She does not have any abnormal  tone.  No clonus or tremors are appreciated.   Motor strength is quite good in the lower extremities; 5/5 at hip  flexors, knee extensors, dorsiflexors, plantar flexors, and EHL.  Straight leg raise is negative.  Provocative test for sacroiliac joint  pain did not exacerbate pain in the posterior hip at all.  She has good  flexibility with respect to hip flexion.  She has some tightness with  external rotation.  She does have significant amount of tenderness along  the coccyx today with palpation.   Gait is nonantalgic.  Tandem gait and Romberg test are all performed  adequately.   IMPRESSION:  1. Lumbar spondylosis.  2. Lumbar facet arthropathy.  3. Possible sacroiliac joint involvement.  4. Questionable coccydynia.  5. Status post right knee replacement.   PLAN:  We will obtain radiograph of the coccyx to evaluate for any kind  of trauma.  We will hold off on any medications at this time.  We will  discontinue her Neurontin in light of her complaints of facial flushing.  We will see her back in a month.  I have also asked her to follow up  with her primary care physician for routine physical exam and blood  work.  It is apparently been over a year since she has had any kind of  evaluation.  She cannot really remember the last time she had mammogram.  She is not sure when exactly she had a  colonoscopy.   We will see her back in a month.           ______________________________  Brantley Stage, M.D.     DMK/MedQ  D:  12/05/2008 14:10:10  T:  12/06/2008 02:12:31  Job #:  [id number]

## 2011-04-05 NOTE — Assessment & Plan Note (Signed)
[name] is a patient of Dr. Cleophas Dunker.  She is a 70 year old woman with a  long history of chronic, predominately, left buttock pain.   She has been participating in a physical therapy program over the last  several weeks.  Focus has been on soft tissue mobilization,  strengthening, and stretching.   She states I am better than I was.  She feels that the therapy has  done better than any other treatment options, she had been offered in  the past.   She states she is doing her exercises regularly.  She has a home  program.  Her average pain is about 6 on a scale of 10.  Pain improves  with heat therapy and exercise.  She finds that her medications give her  fair relief as well.   Medications provided through this clinic include Ultracet 1-2 p.o.  b.i.d. and p.r.n. Mobic 7.5 mg, she does not take this every day.   She states that her low back has been bothering a little bit more  recently.  She has an area along the left medial side that is bothering  her recently which she attributes to one of the exercises she has been  doing.   FUNCTIONAL STATUS:  She is able to walk 1 hour to 1-1/2 hours at a time.  She is able to climb stairs and drive.  She is a high functioning  individual, independent with all self-care.   REVIEW OF SYSTEMS:  She reports no problems.   Past medical, social, and family history unchanged from previous visit.   Exam today, blood pressure is 117/73, pulse 69, respirations 18, 95%  saturation on room air.  She is a well-developed, well-nourished female  who appears her stated age and does not appear in any distress.   She is oriented x3.  Speech is clear.  Affect is bright.  She is alert,  cooperative, and pleasant.  Follows commands without difficulty.  Answers questions appropriately.   Cranial nerves coordination are intact.  Reflexes are 2+ patellar  tendons and Achilles tendons, symmetric.  No abnormal tone is noted.  No  clonus is noted.  No tremors  are appreciated.   No sensory deficits are appreciated.   Motor strength, 5/5 at hip flexors, knee extensors, dorsiflexors,  plantar flexors, EHL.  5/5 at hip abductors as well.  She has some  tenderness along the medial side consistent with a mild adductor strain.   Transitioning from sitting to standing is done with ease.  Gait is  normal.  Tandem gait, Romberg test all performed adequately.   IMPRESSION:  1. History of lumbar spondylosis.  2. Lumbar facet arthropathy most likely exacerbated with one of her      exercises.  She does have some discomfort when she extends her      lumbar spine and rotates slightly to the right.  3. Known sacroiliac joint arthritis, mild osteoarthritis.  4. Known left hip mild degenerative changes.  5. Probable mild myofascial gluteal muscular dysfunction.  6. Status post right knee replacement.   PLAN:  We will switch her to Ultram 1 p.o. b.i.d. to q.i.d. p.r.n. back  or hip pain, #90 with 1 refill.  She does not need a refill on her  Voltaren.  I have encouraged her to continue her home program and avoid  exercises for few days that has exacerbated the medial thigh pain and to  avoid hyperextending during any of the exercises.   With respect to  pelvic pain, I have asked her to make sure she does have  routine followup Pap smears and gynecologic followup.  She states she  will comply.   We will see her back in 2 months.           ______________________________  Sabrina Sullivan, M.D.     DMK/MedQ  D:  04/02/2009 11:45:59  T:  04/02/2009 23:37:13  Job #:  [id number]   cc:   Melina Fiddler, MD

## 2011-04-05 NOTE — Group Therapy Note (Signed)
HISTORY OF PRESENT ILLNESS:  Ms. Name is a 70 year old white divorced  white woman who has been kindly referred by Dr. Cleophas Dunker for treatment of  low back pain and predominantly right buttock pain.   Ms. Skelton is relatively a healthy woman with the exception of occasional  mild colitis, gastroesophageal reflux disease, and anxiety.  She  presents to our clinic with a chief complaint of predominantly right  buttock pain, which is worse with sitting.  She also has a approximately  1-year history of gradually worsening low back pain.  She describes the  low back pain as a separate entity from the buttock pain.   She believes the right buttock pain began after a fall back in January  2007.  The pain is typically worse when she sits for a prolonged period  of time.  She has to change positions frequently.  She has seen Dr.  Cleophas Dunker and has had treatment with nonsteroidal antiinflammatory  medications, she has undergone physical therapy for hamstring  tendonitis, and she has also undergone a injections into the ischial  bursa without much subsequent relief.   The low back pain she is experiencing seems to be have come on over the  last year, getting worse, tends to worsen as she is up walking for a  prolonged period of time.  Currently, she is able to walk about 60  minutes before she need to sit down due to this type of pain.  It is  exacerbated by bending and twisting type activities and improves when  she sits.  She denies any problems controlling her bowel or bladder.  She denies any persistent numbness or tingling, although occasionally  she does get some tingling and burning sensation in the back of her left  thigh after sitting for a while.  Denies any specific weakness and  denies any balance problems or trouble walking.   The average pain that she is experiencing in the low back and buttock  area.  She ranks it as about 8 on a scale of 10.  She states that it  almost completely  interferes with general activity.  Pain is fairly  constant throughout the day, sleep tends to be poor.   To alleviate her pain, she does use ice.  She has a TENS unit and  stopped using it about 6 months ago.  She had trouble keeping the leads  on the right buttock area and found it to be more troublesome than  helpful.   She gets fair relief with her current medications.   Functional status is quite good overall.  She is able to walk 60 minutes  at a time.  She does not use an assistive devices.  She is able to  climbs stairs and she does drive.  She is independent with all of her  self care.  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   She does admit to some depression.  Denies suicidal ideation.   REVIEW OF SYSTEMS:  Positive for some lower abdominal pain, recently  associated with some dark urine without dysuria.  She states she will  follow up with her primary care doctor Ludwig Clarks.  She also admits to some  occasional gastric reflux.   Physicians currently involved in her care include the following; Dr.  Ludwig Clarks, Dr. Cleophas Dunker, Dr. [name], Danise Edge, and Dr. Retta Diones who  is her urologist.   PAST MEDICAL HISTORY:  Gastroesophageal reflux disease, colitis,  and  anxiety.   PAST SURGICAL HISTORY:  Right knee arthroscopic surgery, right knee  replacement January 02, 2006, rotator cuff surgery, and neck fusion.   SOCIAL HISTORY:  The patient has been divorced 5 times.  She lives alone  currently.  She occasionally drinks alcohol, socially with friends.  She  had difficulty giving me an exact amount per week or per month.  She  denies smoking.  She quit approximately 4 months ago, has a 30-year  history prior to that of about a pack of cigarettes a day.   FAMILY HISTORY:  Mother died at age 50 of possible kidney failure.  Father died rather young secondary to alcohol.  She had a grandmother  who is an alcoholic.  Brother who died at  age 50, who was murdered, and  another brother who died secondary to pancreatitis from alcohol.   CURRENT MEDIATIONS:  She comes in with today include temazepam 15 mg  daily, Nexium 40 mg daily, alprazolam 0.5 one tablet twice a day,  hyoscyamine 0.125 one, four times a day, Percocet 5/325, Evista 60 mg  daily, and Lidoderm patch p.r.n.   She has an intolerance to MORPHINE, which causes itching.   On radiograph reports which she brings in today include an MRI of her  lumbar spine done May 17, 2008, which was remarkable for facet joint  degenerative changes at L2 through L3 through L5-S1 most apparent on the  right at the L4-L5 level.  There was no significant spinal stenosis,  foraminal narrowing, or nerve root compression.  She also has had an MRI  of her pelvis without contrast.  Significant findings there include mild  spurring of the sacroiliac joint and felt likely to be degenerative.  There was no significant bursitis noted.  No hip effusion or acute bony  findings noted.  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   Left hip x-ray, which was done October 12, 2007, showed stable mild  degenerative changes in the left hip.   PHYSICAL EXAMINATION:  VITAL SIGNS:  Her blood pressure is 152/88, pulse  71, and respirations 18, and 97% saturate on room air.  GENERAL:  She is well-developed, well-nourished woman, who appears her  stated age.  She  is oriented x3.  Her speech was clear.  Her affect was  bright.  She was alert, cooperative, and pleasant, and she followed  commands without any difficulty.   She was noted to have some flattening of the lumbar lordosis and some  limitations in motion in all planes.  Hip motion was relatively  preserved bilaterally and was without discomfort.  She had a full range  of motion in both lower extremities at the knees and ankles.  She had a  healed  scar over the right knee.   Her cranial nerves were grossly intact.  Coordination was intact.  Sensation was intact to light, touch, and pinprick in the bilateral  lower extremities.  Reflexes were 2+ at the patellar tendons and at the  Achilles tendons.  No abnormal tone was noted.  No clonus was noted.  She was able to transition easily from sitting to standing.  Tandem gait  and Romberg tests were all performed adequately.  Balance was good.   Further examination of the hip and buttock musculature revealed  exquisite tenderness along the sacroiliac joint line on the left  including the  tenderness into the short rotator muscles of the left hip.  There was really not much tenderness along the trochanter on the left or  down the iliotibial band.  The ischium was palpated bilaterally and  although some tenderness was noted, it was not as exquisitely tender as  along the distal sacroiliac joint line and into the short rotators.   IMPRESSION:  1. Lumbar facet arthropathy.  2. Possible gluteal muscle dysfunction.  3. Possible sacroiliac joint involvement.  4. Status post right knee replacement.   Ms. Horrell is a 69 year old woman who presents to our pain and rehab  clinic with a several year history of predominantly low back and left  buttock pain.  Her low back pain seems to have been gradually worsening  over the course of the last year, now bothers her, she is up walking  more than about an hour, and I believe this is most likely facet-  mediated pain.   She also has some left posterior hip pain.  She believes this may have  started after a fall on to her buttock back in February.  This pain  occurs with prolonged sitting.  She has undergone injections into the  ischial bursa without much relief.  She has also had physical therapy to  address possible hamstring tendonitis.  She is no longer doing any type  of stretching or any kind of therapeutic exercise at this time.  She has  not  engaged at all in a home program.   She continues to have some exquisite tenderness in the area of the  piriformis as well as the sacroiliac joint with palpation over the  external rotators of the hip.  A hamstring insertion is not as  exquisitely tender at this time.   The buttock pain etiology is less clear to me at this time and may  consider etiologies to include muscle dysfunction possibly a sacroiliac  joint involvement.  Also, I would consider possibility of some mild hip  osteoarthritis.  However, she does not seem to have much pain with  internal and external rotation of the hip at this time and I believe it  is less likely.   PLAN:  At this time is to initially trial her with some medial branch  block to address her low back pain and determine if she is going to get  any relief with this and may move toward radiofrequency neurotomy if  these are not successful.   To adjust the lower posterior hip pain, we would most likely start back  with some physical therapy with an emphasis on dressing the hip rotators  including some deep soft tissue massage and an emphasis on continuing  her home program.  I may also consider sacroiliac joint injection for  diagnostic as well as therapeutic purposes.  I will see her back in a  month.  She will set up for medial branch block of the lumbar spine.           ______________________________  Brantley Stage, M.D.     DMK/MedQ  D:  07/16/2008 08:30:29  T:  07/16/2008 12:48:03  Job #:  [id number]   cc:   Melina Fiddler, MD

## 2011-04-05 NOTE — Procedures (Signed)
NAME:  Sabrina Sullivan, Sabrina Sullivan                  ACCOUNT NO.:  [id number]   MEDICAL RECORD NO.:  [id number]           PATIENT TYPE:   LOCATION:                                 FACILITY:   PHYSICIAN:  Celene Kras, MD        DATE OF BIRTH:  08-28-1941   DATE OF PROCEDURE:  DATE OF DISCHARGE:                               OPERATIVE REPORT   CONTENT:  Sabrina Sullivan [facility] to [facility], I  evaluated her.  I reviewed the health and history form, 14-point review  of systems.   1. The spondylitic pain is all, but resolved.  I think that is a      measure of both the combination procedure facet epidural, more so      the spondylitic pain with the facet.  2. She is describing left inferogluteal pain which I examined.      Historical features are consistent with SI and it is reasonable to      go on to inject the SI.  These are target structures that could be      amenable to RF.  She will assess this within the context of      activities of daily living.  3. Other modifiable features and health profile discussed.  She has      quit smoking, and this should be commended.  She seems to be doing      pretty well from an overall functional standpoint improved at      least, with this inferogluteal pain being the problematic feature.      She has trouble sitting for long periods of time, difficulty with      endurance activities, and I do think the SI injection will be      beneficial, therapeutic, and diagnostic.  Follow up with her in 1      month and determine further course of care.   Objectively, diffuse paralumbar fascia.  Wharton test positive, modified  __________ left greater than right positive.  Inferogluteal pain noted  with palpation, consistent with SI.  The spondylitic pain is much  improved, better range of motion in lumbar position, nothing new  neurologically.   IMPRESSION:  Sacral joint pain.  Spondylitic pain improved degenerative  spinal disease of lumbar  spine.   PLAN:  SI joint injection, left side, she is consented.   The patient was taken to the operating suite and placed in prone  position.  Back prepped and draped in usual fashion.  Using a 25-gauge  spinal needle, I advanced the inferomedial margin of the sacral joint  under direct fluoroscopic observation and confirmed placement of  multiple fluoroscopic positions.  I then injected 3 mL of Marcaine and  0.5% MPF, and 40 mg of __________.   Tolerated procedure well.  No complications from our procedure.  Appropriate recovery.  Discharge instructions given.  Improved at  discharge.           ______________________________  Celene Kras, MD     HH/MEDQ  D:  08/26/2008 11:28:13  T:  08/27/2008 01:29:12  Job:  [id number]

## 2011-04-05 NOTE — Assessment & Plan Note (Signed)
[name] is a 70 year old woman who has multiple pain complaints  which predominately revolve around her low back and lower extremities.  She is back in today for a recheck.  She is known to have lumbar  spondylosis.  She has a history of a fall onto her buttocks a few years  ago and has had some chronic pain in the buttock region since then.  Radiographs were checked last visit.  Sacrum and coccyx x-ray was done  on December 05, 2008, which showed no acute bony abnormality.  Sacral  coccygeal elements appeared normally aligned as well.  Results of this  were reviewed with her today.   She is back in and has complaints of some facial rashes that she has had  recently and she is seeing a dermatologist for this, Dr. Jorja Loa.   Sabrina Sullivan is complaining bitterly of low back and buttock pain, pain  which radiates into the posterior thigh region, pain is about 8 on a  scale of 10.  Sleep is fair.  Pain moderately interferes with activity  to a little bit.  Her pain is worse with sitting, improves with heat or  ice, injections.   She is currently not on any medications from this clinic at this time.   FUNCTIONAL STATUS:  She is able to be up most of the day.  She is a very  high functioning individual, independent with all self-care.  Denies  problems with respect to review of systems today.   Current physicians involved in her care include Dr. Ralene Ok, Dr.  Albertha Ghee, Dr. Laural Benes.  She states that Dr. Laural Benes has done a  colonoscopy for her within the last 3 years.  She is scheduled to see  him again.   Past medical, social, family history are otherwise unchanged.   MEDICATIONS:  In this clinic, none at this time.   PHYSICAL EXAMINATION:  VITAL SIGNS:  Blood pressure is 130/61, pulse 71,  respirations 18, 95% saturated on room air.  GENERAL:  She is a well-developed, well-nourished woman who does not  appear in any distress.  She is oriented x3.  Speech is clear.  Affect  is bright.  She is alert, cooperative, and pleasant.  She follows  commands without difficulty, answers questions appropriately.  Cranial  nerves and coordination are intact.  Reflexes are 2+ at the patellar and  Achilles tendons.  No abnormal tone is noted.  No clonus is noted.  Normal muscle bulk is appreciated.  No tremors are appreciated.   Manual muscle testing reveals 5/5 strength at hip flexors, hip  abductors, hip adductors, knee extensors and flexors, dorsiplantars and  flexors.  No focal deficits are appreciated.  She has quite a bit of  tenderness over the trochanters bilaterally.  She has tenderness into  the gluteal musculature bilaterally as well.   Gait is normal.  She has a normal stride length, normal base of support,  normal heel-toe mechanics, mild limitations in lumbar motion, extension  does not bother her particularly, nor does flexion today.   IMPRESSION:  1. History of lumbar spondylosis.  2. Lumbar facet arthropathy.  3. 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  4. Status post right knee replacement.   PLAN:  Would like to get her set up with some physical therapy to  address her myofascial and musculature imbalances.  We will advise them  to do some deep tissue massage in the deep gluteal musculature.  We will  have her set  up for this.  She is in agreement with this.  We will also  trial her on Ultracet 1-2 p.o. b.i.d. #120 as well as Mobic 7.5 one p.o.  daily p.r.n. #15 with 1 refill.  I will see her back in a month.  I  reviewed the risks and benefits of these medications.  She would like to  trial them.  She understands that Mobic does carry risk cardiovascular  as well as GI risk.  She will use this sparingly over the next month.  We will see her back in a month.           ______________________________  Brantley Stage, M.D.     DMK/MedQ  D:  01/09/2009 13:56:23  T:  01/10/2009 03:55:52  Job #:  [id number]   cc:   Melina Fiddler, MD

## 2011-04-08 NOTE — Op Note (Signed)
NAMEJERALD, VILLALONA                  ACCOUNT NO.:  [id number]   MEDICAL RECORD NO.:  [id number]          PATIENT TYPE:  AMB   LOCATION:  DSC                          FACILITY:  [facility]   PHYSICIAN:  [name]. [name], M.D. DATE OF BIRTH:  19-Apr-1941   DATE OF PROCEDURE:  DATE OF DISCHARGE:                                 OPERATIVE REPORT   PREOPERATIVE DIAGNOSES:  Right shoulder rotator cuff tear and impingement  syndrome.   POSTOPERATIVE DIAGNOSES:  Right shoulder rotator cuff tear and impingement  syndrome.   PROCEDURE:  Right shoulder arthroscopy, subacromial decompression, distal  clavicle excision and mini open rotator cuff repair.   SURGEON:  [name]. [name], M.D.   INDICATION FOR PROCEDURE:  The patient is a 70 year old with acute on  chronic onset of shoulder pain.  Informed consent was obtained after finding  MRI evidence of a rotator cuff tear.   DESCRIPTION OF PROCEDURE:  The patient was laid supine under general  anesthesia and placed in the beach-chair position.  The right shoulder was  prepped and draped in the usual sterile fashion.  Anterior, posterior and  direct lateral portals were created with an #11 blade and blunt trocar and  cannula.  Diagnostic arthroscopy revealed a frayed labral tear which was  debrided through the anterior portal with a small shaver.  There was no  arthritis.  There was an obvious rotator cuff tear.  I then went into the  subacromial space from the posterior portal and through the direct lateral  portal performed a subtotal bursectomy, CA ligament release with the  ArthroCare Wand and a very aggressive anterolateral acromioplasty.  There  was very, very little room between the anterolateral acromion and the  rotator cuff tear and this was the obvious pathology.  After completing the  anterolateral acromioplasty with a 4.0 mm cylindrical bur, I then performed  a distal clavicle excision with that same bur.  At this point, I aborted the  arthroscopic portion of the case and converted to a mini open.  I did that  by extending our lateral portal up to a 2-cm incision only.  I pierced  through where the portal was and split the deltoid 2 cm in line with its  raphe.  I then put the Arthrex retractor in place and identified the rotator  cuff tear.  It measured 3 cm with about 1 cm of retraction.  I then used the  arthroscopic bur to bur down to a nice bony bleeding bed.  I then placed two  5.5-mm bio corkscrews from Arthrex and placed modified Mason-Allen sutures  which brought the rotator cuff back very, very nicely.  I then irrigated and  closed with interrupted 0 and 2-0 Vicryl sutures,  Steri-Strips throughout  and Adaptic, 4 x 4's, ABDs, 2-inch silk tape, and a simple sling and swatch.   COMPLICATIONS:  None.   DRAINS:  None.     SDL/MEDQ  D:  02/09/2005  T:  02/09/2005  Job:  [id number]

## 2011-04-08 NOTE — Op Note (Signed)
NAMESHIMIKA, Sabrina Sullivan                  ACCOUNT NO.:  [id number]   MEDICAL RECORD NO.:  [id number]          PATIENT TYPE:  AMB   LOCATION:  DSC                          FACILITY:  [facility]   PHYSICIAN:  [name]. [name], M.D. DATE OF BIRTH:  07-24-1941   DATE OF PROCEDURE:  11/17/2004  DATE OF DISCHARGE:                                 OPERATIVE REPORT   PREOPERATIVE DIAGNOSIS:  Right knee osteoarthritis and meniscus tearing.   POSTOPERATIVE DIAGNOSIS:  Right knee osteoarthritis and meniscus tearing.   PROCEDURE:  Right knee partial medial meniscectomy, medial and  patellofemoral compartment chondroplasty.   SURGEON:  [name]. [name], M.D.   ASSISTANT:  None.   ANESTHESIA:  General.   INDICATION FOR PROCEDURE:  The patient is a 70 year old with failure of  conservative measures plus mechanical symptoms in the knee.   DESCRIPTION OF PROCEDURE:  The patient was laid supine and administered  general anesthesia.  The right lower extremity was prepped and draped in the  usual sterile fashion.  A #11 blade, blunt trocar and cannula were used to  create inferolateral and inferomedial portals.  Diagnostic arthroscopy  revealed some grade 2 and 3 changes on the patella, mainly on the lateral  facet.  A chondroplasty was performed with a 3.5 [place] shaver.  In the  medial compartment there was eburnated bone on the far medial portion as  well as a complex posterior horn  medial meniscus tear.  A significant  chondroplasty was done as well as partial medial meniscectomy.  At this  point I then went into the lateral compartment, which actually looked pretty  good.  There were only some minor grade 1 and 2 changes along the tibial  plateau and some minor radial tearing on the meniscus, which were really  just some minor degenerative changes.  I then lavaged the knee, went back  into extension, finished my chondroplasty on the patella, and then closed  with interrupted 4-0 nylon sutures,  dressings, Adaptic, 10 mL of a Marcaine-  morphine mixture in both portals.  Dressed with Adaptic, 4 x 4's, sterile  Webril, and Ace wrap.   COMPLICATIONS:  None.   DRAINS:  None.       SDL/MEDQ  D:  11/17/2004  T:  11/17/2004  Job:  [id number]

## 2011-04-08 NOTE — Op Note (Signed)
NAMEMAGALI, BRAY                  ACCOUNT NO.:  [id number]   MEDICAL RECORD NO.:  [id number]          PATIENT TYPE:  AMB   LOCATION:  ENDO                         [facility]ACILITY:  [facility]   PHYSICIAN:  Danise Edge, M.D.   DATE OF BIRTH:  July 22, 1941   DATE OF PROCEDURE:  06/28/2005  DATE OF DISCHARGE:                                 OPERATIVE REPORT   PROCEDURE:  Esophagogastroduodenoscopy and Savary esophageal dilation.   PROCEDURE INDICATIONS:  [name] is a 70 year old female born June 15, 1941. On June 20, 2005, she underwent a diagnostic colonoscopy to evaluate  functional type nonbloody diarrhea. Proctocolonoscopy to the cecum was  normal. 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   When [name] developed epigastric discomfort and dysphagia, she stopped  taking her Fosamax and Indocin. She chronically takes a proton pump  inhibitor to manage gastroesophageal reflux.   On July 18, 1997, [name] underwent an esophagogastroduodenoscopy  performed at [facility] Her esophagogastroduodenoscopy was  normal. The 16-mm and 17-mm Savary dilators were passed under fluoroscopic  guidance without signs of mucosal stricture dilation.   ENDOSCOPIST:  Danise Edge, M.D.   PREMEDICATION:  Versed 7 mg, Demerol 50 mg.   DESCRIPTION OF PROCEDURE:  After obtaining informed consent, Sabrina Sullivan was  placed in the left lateral decubitus position on the fluoroscopy table. I  administered intravenous Demerol and intravenous Versed to achieve conscious  sedation for the procedure. The patient's blood pressure, oxygen saturation  and cardiac rhythm were monitored throughout the procedure and documented in  the medical record.   The Olympus gastroscope was passed through the posterior hypopharynx into  the proximal esophagus without difficulty. The hypopharynx, larynx and vocal  cords appeared normal.   ESOPHAGOSCOPY:  The proximal mid and  lower segments of the esophageal mucosa  appeared normal. There is no signs of Barrett's esophagus or esophageal  mucosal stricture formation. The squamocolumnar junction and esophagogastric  junction are noted at 40 cm from the incisor teeth.   GASTROSCOPY:  Retroflex view of the gastric cardia and fundus was normal.  The gastric body, antrum and pylorus appeared normal.   DUODENOSCOPY:  The duodenal bulb and descending duodenum appeared normal.   SAVARY ESOPHAGEAL DILATION:  The Savary dilator wire was passed through the  gastroscope and the tip of the guidewire advanced to the distal gastric  antrum as confirmed endoscopically and fluoroscopically. Under fluoroscopic  guidance, a 15 mm Savary dilator passed without resistance. Repeat  esophagogastrostomy revealed no mucosal stricture dilation of the esophagus  and no gastric trauma due to the guidewire.   ASSESSMENT:  Normal esophagogastroduodenoscopy. No signs of Barrett's  esophagus or esophageal mucosal strictures.       MJ/MEDQ  D:  06/28/2005  T:  06/28/2005  Job:  [id number]

## 2011-04-08 NOTE — Discharge Summary (Signed)
NAMEGEORGENIA, Sabrina Sullivan                  ACCOUNT NO.:  [id number]   MEDICAL RECORD NO.:  [id number]          PATIENT TYPE:  INP   LOCATION:  5006                         FACILITY:  [facility]   PHYSICIAN:  [name]. [name], M.D. DATE OF BIRTH:  1941/06/02   DATE OF ADMISSION:  01/02/2006  DATE OF DISCHARGE:  01/06/2006                                 DISCHARGE SUMMARY   ADMISSION DIAGNOSES:  1.  End-stage osteoarthritis right knee.  2.  Gastroesophageal reflux disease with history of esophageal strictures.  3.  Diverticulosis and irritable bowel syndrome.  4.  Anxiety disorder.  5.  History of microscopic colitis.   DISCHARGE DIAGNOSES:  1.  End-stage osteoarthritis, right knee, status post right total knee      arthroplasty.  2.  Acute blood loss anemia secondary to surgery.  3.  Mild hyponatremia, now resolved.  4.  Confusion/delirium secondary to [facility].  5.  Premature ventricular contractions.  6.  Constipation.  7.  Gastroesophageal reflux disease with history of esophageal strictures.  8.  Diverticulosis and irritable bowel syndrome.  9.  Anxiety disorder.  10. History of microscopic colitis.   SURGICAL PROCEDURES:  On January 02, 2006, Sabrina Sullivan underwent a right total  knee arthroplasty by Dr. [name]. Lucey, assisted by Richardean Canal, PAC.  She had a NexGen LPS flex femoral component, size D, right, placed with an  LPS flex articular surface, size CD, 10 mm height.  A fluted stem tibial  component size 4 with a NexGen all poly patella standard size 32, 8.5 mm  thickness.   COMPLICATIONS:  None.   CONSULTS:  1.  Physical therapy consult January 03, 2006.  2.  Occupational therapy consult January 04, 2006.  3.  [facility] cardiology consult January 04, 2006.   HISTORY OF PRESENT ILLNESS:  A 70 year old white female patient presents to  Dr. [name] with 4-year history of gradual onset progressive right knee pain.  The pain is now  constant, aching to sharp sensation, with varying intensity.  It is located across the anterior aspect of the joint without radiation.  It  increases with prolonged weightbearing and cold and decreases with heat.  The knee does pop.  She has failed conservative treatment and x-rays show  end-stage arthritic changes.  Because of this, she is presenting for a right  knee replacement.   HOSPITAL COURSE:  Sabrina Sullivan tolerated her surgical procedure well without  immediate postoperative complications.  She was transferred to 5000.  Postop  day #1, she was started on therapy per protocol.  Hemoglobin was 10.5,  hematocrit 30.8.  Right knee dressing was intact.  Leg was neurovascular  intact.   Postop day 2, she complained of some soreness.  She was switched from PCA to  [facility].  She was started on therapy per protocol.  Sodium was a bit  low and that was supplemented with fluids.  HIT panel was obtained due to  platelets being 108.  That was eventually negative.  That evening, however,  after  the switch to the [facility], she had several episodes of confusion with  attempts to get out of bed.  She did have a fall.  Medications were  subsequently stopped.  She also had some episodes of irregular heartbeat  that day, and a consult by [facility] was  obtained.  They did follow her throughout the rest of the hospitalization.  They felt there was no significant cardiac disease at this time and did  recommend an outpatient Cardiolite.   Postop day #3, confusion was starting to resolve with a switch of meds.  Hemoglobin was 8.3, hematocrit 23.8, and she was transfused with 1 unit of  packed red blood cells.  She tolerated that well. Wound was intact.  Potassium was low at 3.3 and that was supplemented.  Tachycardia was  improving.   On postop day #4, confusion has completely resolved.  Vicodin is helping her  pain effectively.  T-max 100.8, vitals stable.   Hemoglobin 9.2, hematocrit  26.6, potassium is 3.6.  it is felt she is ready for DC home today after  having a bowel movement and working with therapy.  She will be DC'd home  later today.   LABORATORY DATA:  Chest x-ray done on December 29, 2005, shows no active lung  disease, hyperaeration consistent with COPD.   AP pelvis done on February 14th showed degenerative changes, but no acute  bony findings.   X-ray taken of the right hip on February 15th showed moderate degenerative  changes affecting the right hip.  No underlying fractures.   X-ray of the right tib-fib on February 15th showed no acute findings and  stable right knee replacement.   Hemoglobin and hematocrit ranged from 13.5 and 40.2 on February 8th, to a  low of 8.3 and 23.8 on the 15th.  Platelets went from 167 on 8th to 96 on  the 15th.  White count remained within normal limits.  On February 15th,  reticulocyte count was 37.2, with percentage 1.4, RBC 2.66.  HIT panel on  February 14th was negative.   Sodium ranged from 136 on the 8th to a low of 131 on the 14th, to 135 on the  15th.  Potassium ranged from 4.1 on the 13th to 3.3 on the 15th.  Glucose  ranged from 77 on the 8th to a high of 136 on the 13th.  Calcium ranged from  9.8 on the 8th to a low of 8.2 on the 14th.   On February 15th, triglycerides were 107, HDLs were 38, cholesterol 981.  Total cholesterol HDL ratio was 3.8, VLDL was 21, LDL was 85.   Urinalysis on February 8th showed moderate leukocyte esterase.  All other  indices within normal limits.      Legrand Pitts Duffy, P.A.    ______________________________  [name]. [name], M.D.    KED/MEDQ  D:  01/06/2006  T:  01/06/2006  Job:  [id number]   cc:   [facility]   Ralene Ok, M.D.  Fax: [phone]

## 2011-04-08 NOTE — H&P (Signed)
NAME:  Sabrina Sullivan, WRAGG                  ACCOUNT NO.:  [id number]   MEDICAL RECORD NO.:  [id number]            PATIENT TYPE:   LOCATION:                                 FACILITY:   PHYSICIAN:  [name]. [name], M.D. DATE OF BIRTH:  1941/11/10   DATE OF ADMISSION:  01/02/2006  DATE OF DISCHARGE:                                HISTORY & PHYSICAL   CHIEF COMPLAINT:  Right knee pain for the last 4 years.   HISTORY OF PRESENT ILLNESS:  A 70 year old white female patient presented to  Dr. [name] with a 4-year history of gradual onset but progressively worsening  right knee pain. She has no history of injury to the knee but did have a  right knee arthroscopy done by Dr. [name] on November 17, 2004. At this  point, the pain in the knee is a constant, aching to very sharp sensation  with the intensity varying throughout the day. Pain seems to be located  across the anterior aspect of the joint without radiation. Pain increases  with prolonged weightbearing and cold and then decreases with the use of a  heating pad. The knee does pop at times. There is no catching, locking,  grinding or giving way and no swelling. Pain does not keep her up at night.  She is currently taking Percocet for pain, and that provides some relief.  She has received cortisone injections in the past with also some relief. She  is not ambulating with any assistive devices.   ALLERGIES:  mg causes itching.   CURRENT MEDICATIONS:  1.  Alprazolam 0.5 mg 2 tablets p.o. nightly and 1/2 tablet p.o. q.a.m.      p.r.n. anxiety.  2.  Evista 60 mg 1 tablet p.o. q.a.m.Marland Kitchen  3.  Nexium 40 mg 1 tablet p.o. q.a.m.Marland Kitchen  4.  Calcium plus vitamin D 600 mg 1 tablet p.o. 3 times a day.  5.  Chondroitin and glucosamine 1 tablet p.o. twice daily.   PAST MEDICAL HISTORY:  1.  Gastroesophageal reflux disease with history of esophageal strictures      requiring dilation.  2.  Diverticulosis with irritable bowel syndrome.  3.  Recent microscopic  colitis.  4.  Anxiety disorder.   He denies any history of diabetes mellitus, hypertension, thyroid disease,  hiatal hernia, peptic ulcer disease, heart disease, asthma, or any other  chronic medical condition other than noted previously.   PAST SURGICAL HISTORY:  1.  Hysterectomy 1970s.  2.  Cervical fusion 1980s by Dr. Shirlean Kelly.  3.  Right knee arthroscopy November 17, 2004, by Dr. [name]. Lucey.  4.  Right rotator cuff repair March 2006 by Dr. [name]. Lucey.  5.  Tonsillectomy.   He denies any complications from the above-mentioned procedures.   SOCIAL HISTORY:  She has a 12-1/2-pack-year history of cigarette smoking  which she still smokes 1/2 pack a day. She does drink alcohol just on  occasional social situations. She does not use any drugs. She is divorced  and has three children. She lives by herself in a [place] house  with one  step into the main entrance. 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 Her  medical doctor is Dr. Ralene Ok at [phone].   FAMILY HISTORY:  Mother is alive at age 57 with congestive heart failure,  dementia, and osteoarthritis. Father passed away in his 30s with peritonitis  due to alcoholism. She has one living brother age 36 with arthritis and two  brothers who passed away, one at age 33 with gunshot wound, one in his 30s  with pancreatitis due to alcoholism. Her daughters are ages 24, 40, and 59,  and they are all alive and well.   REVIEW OF SYSTEMS:  She does have chronic tinnitus. She has had problems  with her left ear which has been treated by Dr. Ezzard Standing. She said she  recently had some type of treatment where he put something purple in her  ear. She does have sinus congestion. She has osteoarthritic symptoms in PIP  joints of her hands. She does have problems with reflux. No living will nor  power of attorney. All other systems were negative and noncontributory.   PHYSICAL EXAMINATION:  GENERAL: Thin, well-developed,  well-nourished white  female in no acute distress. Talks easily with examiner. Walks with a slight  limp. Mood and affect are appropriate. Height 5 feet 4 inches, weight 126  pounds, BMI is 21.  VITAL SIGNS: Temperature 97.5 degrees Fahrenheit, pulse 76, respirations 14,  and blood pressure 120/80.  HEENT: Normocephalic, atraumatic without frontal or maxillary sinus  tenderness to palpation. Conjunctiva pink. Sclerae anicteric. PERLA. EOMs  intact. No visible external ear deformities. Hearing grossly intact.  Tympanic membrane on the right is pearly gray with good light reflex. The  left ear canal does have something that is purple in it. Tympanic membrane  appears intact. Nose and nasal septum midline. Nasal mucosa pink and moist  without exudates or polyps noted. Buccal mucosa pink and moist. Dentition in  good repair. Pharynx without erythema or exudates. Tongue and uvula midline.  Tongue without fasciculations, and uvula rises equally with phonation.  NECK:  No visible masses or lesions noted. Trachea midline. No palpable  lymphadenopathy or thyromegaly. Carotids +2 bilaterally without bruits. Full  range of motion, nontender to palpation along the cervical spine.  CARDIOVASCULAR:  Heart rate and rhythm regular. S1-S2 present without rubs,  clicks or murmurs noted.  RESPIRATORY:  Respirations even and unlabored. Breath sounds clear to  auscultation bilaterally without rales or wheezes noted.  ABDOMEN:  Colon rounded abdominal contour. Bowel sounds present x4  quadrants. Soft, nontender to palpation without hepatosplenomegaly or CVA  tenderness. Femoral pulses +2 bilaterally. Nontender to palpation along the  vertebral column.  BREASTS/GU/RECTAL/:PELVIC: These exams deferred at this time.  MUSCULOSKELETAL: No obvious deformities bilateral upper extremities with  full range of motion these extremities without pain. Radial pulses +2 bilaterally. She has full range of motion of her hips,  ankles and toes  bilaterally. DP and PT pulses are +2. No lower extremity edema and no calf  pain with palpation and negative Homans' sign bilaterally.  LEFT KNEE has full extension and flexion to 145 degrees without crepitus. No  pain with palpation along the joint line. No effusion. Stable to varus and  valgus stress. Negative anterior drawer. Right knee skin intact. No erythema  or ecchymosis. She is lacking about 5 degrees of full extension and can flex  the knee 125 degrees. There is moderate patellofemoral crepitus, +1 effusion  in the knee. She is tender to palpation  on both the medial lateral joint  line, medial more so than lateral. Stable to varus and valgus stress.  Negative anterior drawer. NEUROLOGIC: Alert and oriented x3. Cranial nerves  II-XII are grossly intact. Strength 5/5 bilateral upper and lower  extremities. Rapid alternating movements intact. Deep tendon reflexes 2+  bilateral upper and lower extremities. Sensation intact to light touch.   RADIOLOGIC FINDINGS:  X-rays taken of the right knee in December 2006 show  end-stage osteoarthritis in that knee.   IMPRESSION:  1.  End-stage osteoarthritis right knee.  2.  Gastroesophageal reflux disease with history of esophageal strictures.  3.  Diverticulosis and irritable bowel syndrome.  4.  Anxiety disorder.  5.  History of microscopic colitis.   PLAN:  Ms. Hakim will be admitted to [facility] on January 02, 2006, where she will undergo a right total knee arthroplasty by Dr. [name]. Lucey. She will undergo all the routine preoperative laboratory tests and  studies prior to this procedure. If she has any medical issues while she is  hospitalized,we will consult one of the hospitalists.      Legrand Pitts Duffy, P.A.    ______________________________  [name]. [name], M.D.    KED/MEDQ  D:  12/27/2005  T:  12/27/2005  Job:  [id number]

## 2011-04-08 NOTE — Op Note (Signed)
NAMESHYANE, Sabrina Sullivan                  ACCOUNT NO.:  [id number]   MEDICAL RECORD NO.:  [id number]          PATIENT TYPE:  INP   LOCATION:  2899                         FACILITY:  [facility]   PHYSICIAN:  [name]. [name], M.D. DATE OF BIRTH:  [birth year]   DATE OF PROCEDURE:  01/02/2006  DATE OF DISCHARGE:                                 OPERATIVE REPORT   SURGEON:  [name]. [name], M.D.   ASSISTANT:  Richardean Canal, P.A.   ANESTHESIA:  General.   PREOPERATIVE DIAGNOSIS:  Right knee osteoarthritis.   POSTOPERATIVE DIAGNOSIS:  Right knee osteoarthritis.   PROCEDURE:  Right total knee arthroplasty.   INDICATION FOR PROCEDURE:  The patient is a 70 year old white female with  failure of conservative measures for osteoarthritis of the knee.  Informed  consent was obtained.   DESCRIPTION OF PROCEDURE:  The patient was laid supine and administered  general anesthesia after preop femoral nerve block.  The right leg was  prepped and draped in the usual sterile fashion.  A #10 blade was used to  make a midline incision.  I then used a fresh blade to perform a synovectomy  and to elevate the deep MCL off the medial crest of the tibia.  I inverted  the tibia and it measured 23-mm thick.  I reamed down to 9 mm to remove 9 mm  of thickness and with a 32-mm template, drilled the lug holes.  At this  point I then put the prosthetic trial in place and with the prosthetic trial  in place, it also measured 23-mm thick.  I then went into flexion.  I used  the extramedullary alignment system to remove a perpendicular cut of bone  perpendicular to the anatomic axis of the tibia.  I then used the  intramedullary alignment system to remove a distal femoral cut in 6-degree  valgus angle.  I then sized to a size D, pinned the size D cutting block  into place and made the anterior, posterior and chamfer cuts.  I then use  the laminar spreader and with the ACL, PCL, medial and lateral menisci under  tension.  I  placed a 10-mm spacer block in the knee, had excellent flexion  and extension gap balance.  I then finished the femur with a size D  finishing block, drilling the lug holes and cutting for the box.  I finished  the tibia with a size 4 tibial tray, drilling with the keel.  I then trialed  with a size 4 tibia, size 3 Hi-Flex femur, 10 insert, 32-mm template and had  good flexion and extension gap balance, dropping the angles back to 140  degrees.  I removed the trial components, copiously irrigated and cemented  in the components.  I removed excess cement and then closed with #1 figure-  of-eight Vicryl sutures in the arthrotomy, buried 0 Vicryl sutures in the  deep soft tissues, a running 2-0 Vicryl stitch and skin staples.  I left the  Hemovac coming out superolaterally and deep to the arthrotomy.  I then  dressed with  Adaptic,  4 x4's, sterile Webril and an Ace wrap.   COMPLICATIONS:  None.   DRAINS:  One Hemovac.   ESTIMATED BLOOD LOSS:  Minimal.           ______________________________  [name]. [name], M.D.     SDL/MEDQ  D:  01/02/2006  T:  01/03/2006  Job:  [id number]

## 2011-04-26 ENCOUNTER — Encounter: Payer: PRIVATE HEALTH INSURANCE | Attending: Neurosurgery | Admitting: Neurosurgery

## 2011-04-26 DIAGNOSIS — IMO0001 Reserved for inherently not codable concepts without codable children: Secondary | ICD-10-CM | POA: Insufficient documentation

## 2011-04-26 DIAGNOSIS — G894 Chronic pain syndrome: Secondary | ICD-10-CM

## 2011-04-26 DIAGNOSIS — M76899 Other specified enthesopathies of unspecified lower limb, excluding foot: Secondary | ICD-10-CM | POA: Insufficient documentation

## 2011-04-26 DIAGNOSIS — M545 Low back pain, unspecified: Secondary | ICD-10-CM | POA: Insufficient documentation

## 2011-04-26 DIAGNOSIS — M5430 Sciatica, unspecified side: Secondary | ICD-10-CM

## 2011-04-27 NOTE — Assessment & Plan Note (Signed)
ACCOUNT:  [id number].  [name] the patient who has been followed by me since April.  She has a low back pain, is treated with Dr. Pamelia Hoit for sometime for the low back and buttock pain, but has not been seen up until the May visit.  She does have tramadol and anti-inflammatory she uses.  We did obtain an MRI of the lumbar spine that was negative for canal stenosis.  She had some moderate facet arthropathy at L4, L5, and S1, but nothing surgical in nature.  We did talk about facet blocks with Dr. Wynn Banker, but she has declined injections.  She states that she feels like she can manage her pain with her medicines.  She rates pain about 7 or 8, it is burning with stabbing pain.  Activity level is about 6-7.  Pain is worse in the evening. Sleep patterns are fair.  She does get aggravation of the pain with sitting and standing too long.  Heat and medication tend help.  She walks without assistance.  She is not employed.  REVIEW OF SYSTEMS:  Notable for those difficulties, otherwise within normal limits.  PAST MEDICAL HISTORY:  Unchanged.  SOCIAL HISTORY:  She is divorced, lives by herself.  PHYSICAL EXAMINATION:  VITAL SIGNS:  Blood pressure is 136/84, pulse 73, respirations 18, O2 sats 98% on room air. CONSTITUTIONAL:  She is within normal limits.  She is alert and oriented x3. NEUROLOGIC:  Her motor strength is good and her sensation is intact in lower extremities.  She still has complaints of the left buttock pain.  ASSESSMENT: 1. Greater trochanter bursitis history. 2. Low back pain with left radicular pain.  PLAN:  Again, she has declined any further treatment as far as injections to go at this point.  She will continue her medicines.  She has medicines refilled already.  She does not need any prescriptions today.  She is asked to follow up with Dr. Pamelia Hoit for personal matter in about a month, so we went ahead and get that scheduled.  Her questions were encouraged and  answered.     Sahira Cataldi L. Blima Dessert Electronically Signed    RLW/MedQ D:  04/26/2011 12:42:45  T:  04/27/2011 84:13:24  Job #:  [id number]

## 2011-06-22 ENCOUNTER — Encounter
Payer: PRIVATE HEALTH INSURANCE | Attending: Physical Medicine and Rehabilitation | Admitting: Physical Medicine and Rehabilitation

## 2011-06-22 DIAGNOSIS — M129 Arthropathy, unspecified: Secondary | ICD-10-CM | POA: Insufficient documentation

## 2011-06-22 DIAGNOSIS — Z96659 Presence of unspecified artificial knee joint: Secondary | ICD-10-CM | POA: Insufficient documentation

## 2011-06-22 DIAGNOSIS — M6289 Other specified disorders of muscle: Secondary | ICD-10-CM | POA: Insufficient documentation

## 2011-06-22 DIAGNOSIS — M546 Pain in thoracic spine: Secondary | ICD-10-CM

## 2011-06-22 DIAGNOSIS — M545 Low back pain, unspecified: Secondary | ICD-10-CM

## 2011-06-22 DIAGNOSIS — M169 Osteoarthritis of hip, unspecified: Secondary | ICD-10-CM | POA: Insufficient documentation

## 2011-06-22 DIAGNOSIS — M549 Dorsalgia, unspecified: Secondary | ICD-10-CM | POA: Insufficient documentation

## 2011-06-22 DIAGNOSIS — M1610 Unilateral primary osteoarthritis, unspecified hip: Secondary | ICD-10-CM | POA: Insufficient documentation

## 2011-06-22 DIAGNOSIS — G894 Chronic pain syndrome: Secondary | ICD-10-CM

## 2011-06-23 NOTE — Assessment & Plan Note (Signed)
HISTORY OF PRESENT ILLNESS:  [name] is a pleasant 70 year old who is followed in [facility] for chronic pain complaints predominately related to her low back and gluteal region bilaterally.  She also has some mid back pain but predominantly her problem is low back pain.  She is back in today and she does not require refill on any medications at this time.  She is currently using Mobic and tramadol on a p.r.n. basis.  She also has trialed Robaxin but was unable to use it regularly due to side effect profile.  She reports today "sometimes I hurt all over."  She has been having some recent problems with what she believes to be interstitial cystitis and plans to follow up with [facility] for further evaluation.  With respect to her low back and posterior hip pain, she continues to have pain 7-8 on a scale of 10 which is worsened by prolonged sitting, improves with rest, ice, and medication.  She gets fair relief with medications prescribed.  FUNCTIONAL STATUS:  She has cut down on her walking somewhat over the summer and she is not engaging in a stretching program as much as she used before her hips.  She is able to climb stairs.  She is driving. She is independent with self-care.  No new problems otherwise with respect to review of systems.  Past medical, social, and family history are otherwise unchanged.  PHYSICAL EXAMINATION:  VITAL SIGNS:  Blood pressure is 126/54, pulse 76, respirations 14, and 96% saturation on room air. NEUROLOGIC:  She is a well-developed, well-nourished woman who appears somewhat younger than her stated age.  She is oriented x3.  Speech is clear.  Affect is bright.  She is alert, cooperative, and pleasant. Follows commands without difficulty.  Answers my questions appropriately.  Cranial nerves to coordination are intact.  Reflexes are 1+ in the lower extremities without abnormal tone, clonus, or  tremors. Motor strength is excellent, 5/5 at hip flexors, knee extensors, dorsiflexors, and plantar flexors.  She has some tenderness, especially in the gluteal musculature and lumbar paraspinal musculature.  She has normal sensation to light touch in the lower extremities.  Transitioning from sitting to standing is done with ease.  Gait in the room is not antalgic.  Tandem gait and Romberg test are all performed quite well.  IMPRESSION: 1. Lumbago with history of facet arthropathy. 2. History of mild hip degenerative joint disease. 3. Mild fascial gluteal muscle dysfunction. 4. Status post right knee replacement, February 2007, Dr. Sherlean Sullivan. 5. Status post remote cervical spine surgery by Dr. Newell Sullivan, date the     patient is unsure, sometime in the 19s.  PLAN:  We will continue current medications.  She has been taking her medications as prescribed.  I have answered all her questions.  She is comfortable with our plan.  We will see her back in 2 months. Currently, she is not requesting further workup.     Sabrina Sullivan, M.D. Electronically Signed   DMK/MedQ D:  06/22/2011 14:28:12  T:  06/23/2011 00:20:03  Job #:  [id number]

## 2011-07-11 ENCOUNTER — Other Ambulatory Visit: Payer: Self-pay | Admitting: Orthopedic Surgery

## 2011-07-11 DIAGNOSIS — M542 Cervicalgia: Secondary | ICD-10-CM

## 2011-07-11 DIAGNOSIS — M549 Dorsalgia, unspecified: Secondary | ICD-10-CM

## 2011-07-15 ENCOUNTER — Ambulatory Visit
Admission: RE | Admit: 2011-07-15 | Discharge: 2011-07-15 | Disposition: A | Discharge status: Home / Self Care | Payer: PRIVATE HEALTH INSURANCE | Source: Ambulatory Visit | Attending: Orthopedic Surgery | Admitting: Orthopedic Surgery

## 2011-07-15 DIAGNOSIS — M542 Cervicalgia: Secondary | ICD-10-CM

## 2011-07-15 DIAGNOSIS — M549 Dorsalgia, unspecified: Secondary | ICD-10-CM

## 2011-09-14 ENCOUNTER — Encounter
Payer: PRIVATE HEALTH INSURANCE | Attending: Physical Medicine and Rehabilitation | Admitting: Physical Medicine and Rehabilitation

## 2011-09-14 DIAGNOSIS — M1990 Unspecified osteoarthritis, unspecified site: Secondary | ICD-10-CM | POA: Insufficient documentation

## 2011-09-14 DIAGNOSIS — M545 Low back pain, unspecified: Secondary | ICD-10-CM | POA: Insufficient documentation

## 2011-09-14 DIAGNOSIS — Z96659 Presence of unspecified artificial knee joint: Secondary | ICD-10-CM | POA: Insufficient documentation

## 2011-09-14 DIAGNOSIS — M129 Arthropathy, unspecified: Secondary | ICD-10-CM | POA: Insufficient documentation

## 2011-09-14 DIAGNOSIS — G8929 Other chronic pain: Secondary | ICD-10-CM | POA: Insufficient documentation

## 2011-09-14 DIAGNOSIS — M2420 Disorder of ligament, unspecified site: Secondary | ICD-10-CM | POA: Insufficient documentation

## 2011-09-14 DIAGNOSIS — M629 Disorder of muscle, unspecified: Secondary | ICD-10-CM | POA: Insufficient documentation

## 2011-09-14 DIAGNOSIS — IMO0001 Reserved for inherently not codable concepts without codable children: Secondary | ICD-10-CM | POA: Insufficient documentation

## 2011-09-14 DIAGNOSIS — M5380 Other specified dorsopathies, site unspecified: Secondary | ICD-10-CM

## 2011-09-14 DIAGNOSIS — G894 Chronic pain syndrome: Secondary | ICD-10-CM

## 2011-09-14 DIAGNOSIS — M76899 Other specified enthesopathies of unspecified lower limb, excluding foot: Secondary | ICD-10-CM

## 2011-09-14 NOTE — Assessment & Plan Note (Signed)
Sabrina Sullivan is a very pleasant 70 year old woman who was last seen by me on June 22, 2011.  She has followed at our [facility] for chronic pain complaints that are predominantly related to her low back and posterior thigh, buttock area.  Pain is worse when she is up walking and when she is lying flat on her back or on her abdomen at night.  She has limited standing or walking tolerance.  Her average pain is about 8 on a scale of 10 and it varies.  She has known facet arthropathy especially L4-5, L5-S1.  She is overall active woman.  She is able to climb stairs, drives.  She is independent with self care.  She denies any problems with respect to review of systems today.  Past medical, social, family history otherwise really unchanged.  She did see Dr. Yevette Edwards recently and some imaging studies were ordered by him.  She has followed up with him since then.  On exam; blood pressure is 124/65, pulse 55, respirations 18, 96% saturated on room air.  She is a well-developed, well-nourished woman who appears her stated age and does not appear in any distress.  She is oriented x3.  Speech is clear.  Affect is bright.  She is alert, cooperative, and pleasant.  Follows commands without difficulty. Answers my questions appropriately.  Cranial nerves, coordination are grossly intact.  Her reflexes are intact in the lower extremities.  No abnormal tone, clonus, or tremors are noted.  She is able to transition easily from sitting to standing.  Gait is normal.  Tandem gait, Romberg test are all performed adequately.  She has good strength in both lower extremities.  Pain is exacerbated with extension, improves somewhat with flexion.  She had an MRI of her cervical and thoracic spine which were done July 15, 2011, ordered by Dr. Yevette Edwards.  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  She had an MRI of her thoracic spine which showed some degenerative disk disease at T6-7 and T7-T8 without central canal or foraminal stenosis.  IMPRESSION: 1. Lumbago with history of facet arthropathy. 2. History of mild degenerative joint disease. 3. Myofascial gluteal muscle dysfunction. 4. 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 [name]. 5. Status post remote spine surgery by Dr. Newell Coral.  PLAN:  I reviewed some of the patient's old records.  She did have a medial branch block back in December 2009.  She indicated she has gotten about 75% relief of her low back pain at that time.  I have discussed this again with her.  She is really not interested in any kind of injection.  She finds the tramadol somewhat helpful.  She typically will use up to 1 pill, occasionally 2 pills a day.  She does not need a refill on her pain medications.  I have encouraged her to continue to maintain her activity level.  I will see her back in 2-3 months.     Brantley Stage, M.D. Electronically Signed   DMK/MedQ D:  09/14/2011 13:50:49  T:  09/14/2011 22:13:04  Job #:  [id number]

## 2011-09-15 ENCOUNTER — Other Ambulatory Visit: Payer: Self-pay | Admitting: Obstetrics and Gynecology

## 2011-09-15 DIAGNOSIS — M949 Disorder of cartilage, unspecified: Secondary | ICD-10-CM

## 2011-09-15 DIAGNOSIS — Z1231 Encounter for screening mammogram for malignant neoplasm of breast: Secondary | ICD-10-CM

## 2011-10-12 ENCOUNTER — Ambulatory Visit: Payer: PRIVATE HEALTH INSURANCE | Admitting: Physical Medicine and Rehabilitation

## 2011-10-17 ENCOUNTER — Other Ambulatory Visit: Payer: PRIVATE HEALTH INSURANCE

## 2011-10-17 ENCOUNTER — Ambulatory Visit: Payer: PRIVATE HEALTH INSURANCE

## 2011-10-24 ENCOUNTER — Encounter: Payer: PRIVATE HEALTH INSURANCE | Admitting: Physical Medicine and Rehabilitation

## 2011-11-17 ENCOUNTER — Other Ambulatory Visit: Payer: PRIVATE HEALTH INSURANCE

## 2011-11-17 ENCOUNTER — Ambulatory Visit: Payer: PRIVATE HEALTH INSURANCE

## 2011-11-30 ENCOUNTER — Encounter
Payer: PRIVATE HEALTH INSURANCE | Attending: Physical Medicine and Rehabilitation | Admitting: Physical Medicine and Rehabilitation

## 2011-11-30 DIAGNOSIS — M545 Low back pain, unspecified: Secondary | ICD-10-CM | POA: Insufficient documentation

## 2011-11-30 DIAGNOSIS — M25559 Pain in unspecified hip: Secondary | ICD-10-CM | POA: Insufficient documentation

## 2011-11-30 DIAGNOSIS — M76899 Other specified enthesopathies of unspecified lower limb, excluding foot: Secondary | ICD-10-CM

## 2011-11-30 DIAGNOSIS — G894 Chronic pain syndrome: Secondary | ICD-10-CM

## 2011-11-30 DIAGNOSIS — M79609 Pain in unspecified limb: Secondary | ICD-10-CM | POA: Insufficient documentation

## 2011-12-01 NOTE — Procedures (Signed)
NAME:  [name], [name]                  ACCOUNT NO.:  [id number]  MEDICAL RECORD NO.:  [id number]           PATIENT TYPE:  O  LOCATION:  PRM                          FACILITY:  [facility]  PHYSICIAN:  Brantley Stage, M.D.DATE OF BIRTH:  03-Nov-1941  DATE OF PROCEDURE:  11/30/2011 DATE OF DISCHARGE:                              OPERATIVE REPORT  [name] is a very pleasant 71 year old woman who is followed [facility] at the [facility].  She has multiple pain complaints predominantly related to her low back and her lateral hip region and intermittently her lower extremities.  [facility] has [facility] to the [facility] for an injection into her left hip that is a trochanteric bursitis injection. She has participated in physical therapy and has been using nonsteroidal antiinflammatory medication, does regular home program with stretches and some strengthening, however, she still continues to have bilateral lateral hip pain which is consistent with trochanteric bursitis/iliotibial band.  I have reviewed other treatment options with her.  She would like to pursue injection into this area.  I reviewed the risks and benefits of this including bleeding, bruising, and infection and she would like to proceed.  Using ultrasound guidance, the area was scanned and images stored.  Using a 25-gauge 1.5-inch needle, 1 mL of Kenalog and 4 mL of 1% lidocaine were injected into the area without difficulty.  The area was prepped with Betadine and alcohol.  The area was marked as well. She tolerated the procedure very well.  Reported decreased pain in the left lateral hip after injection.  I gave her postinjection instructions and we will see her back in a month.     Brantley Stage, M.D. Electronically Signed    DMK/MEDQ  D:  11/30/2011 13:02:45  T:  12/01/2011 04:42:52  Job:  [id number]

## 2011-12-05 ENCOUNTER — Ambulatory Visit
Admission: RE | Admit: 2011-12-05 | Discharge: 2011-12-05 | Disposition: A | Discharge status: Home / Self Care | Payer: PRIVATE HEALTH INSURANCE | Source: Ambulatory Visit | Attending: Obstetrics and Gynecology | Admitting: Obstetrics and Gynecology

## 2011-12-05 DIAGNOSIS — Z1231 Encounter for screening mammogram for malignant neoplasm of breast: Secondary | ICD-10-CM

## 2011-12-05 DIAGNOSIS — M899 Disorder of bone, unspecified: Secondary | ICD-10-CM

## 2011-12-12 ENCOUNTER — Other Ambulatory Visit: Payer: Self-pay | Admitting: Obstetrics and Gynecology

## 2011-12-12 DIAGNOSIS — R928 Other abnormal and inconclusive findings on diagnostic imaging of breast: Secondary | ICD-10-CM

## 2011-12-19 ENCOUNTER — Other Ambulatory Visit: Payer: Self-pay | Admitting: Obstetrics and Gynecology

## 2011-12-19 ENCOUNTER — Ambulatory Visit
Admission: RE | Admit: 2011-12-19 | Discharge: 2011-12-19 | Disposition: A | Discharge status: Home / Self Care | Payer: PRIVATE HEALTH INSURANCE | Source: Ambulatory Visit | Attending: Obstetrics and Gynecology | Admitting: Obstetrics and Gynecology

## 2011-12-19 DIAGNOSIS — R928 Other abnormal and inconclusive findings on diagnostic imaging of breast: Secondary | ICD-10-CM

## 2011-12-19 DIAGNOSIS — R921 Mammographic calcification found on diagnostic imaging of breast: Secondary | ICD-10-CM

## 2011-12-26 ENCOUNTER — Ambulatory Visit
Admission: RE | Admit: 2011-12-26 | Discharge: 2011-12-26 | Disposition: A | Discharge status: Home / Self Care | Payer: PRIVATE HEALTH INSURANCE | Source: Ambulatory Visit | Attending: Obstetrics and Gynecology | Admitting: Obstetrics and Gynecology

## 2011-12-26 ENCOUNTER — Other Ambulatory Visit: Payer: Self-pay | Admitting: Obstetrics and Gynecology

## 2011-12-26 ENCOUNTER — Ambulatory Visit: Payer: PRIVATE HEALTH INSURANCE | Admitting: Physical Medicine and Rehabilitation

## 2011-12-26 DIAGNOSIS — R921 Mammographic calcification found on diagnostic imaging of breast: Secondary | ICD-10-CM

## 2011-12-27 ENCOUNTER — Other Ambulatory Visit: Payer: Self-pay | Admitting: Obstetrics and Gynecology

## 2011-12-27 ENCOUNTER — Ambulatory Visit
Admission: RE | Admit: 2011-12-27 | Discharge: 2011-12-27 | Disposition: A | Discharge status: Home / Self Care | Payer: PRIVATE HEALTH INSURANCE | Source: Ambulatory Visit | Attending: Obstetrics and Gynecology | Admitting: Obstetrics and Gynecology

## 2011-12-27 DIAGNOSIS — C50911 Malignant neoplasm of unspecified site of right female breast: Secondary | ICD-10-CM

## 2011-12-27 DIAGNOSIS — R921 Mammographic calcification found on diagnostic imaging of breast: Secondary | ICD-10-CM

## 2011-12-28 ENCOUNTER — Encounter
Payer: PRIVATE HEALTH INSURANCE | Attending: Physical Medicine and Rehabilitation | Admitting: Physical Medicine and Rehabilitation

## 2011-12-28 DIAGNOSIS — M542 Cervicalgia: Secondary | ICD-10-CM

## 2011-12-28 DIAGNOSIS — M545 Low back pain, unspecified: Secondary | ICD-10-CM | POA: Insufficient documentation

## 2011-12-28 DIAGNOSIS — M76899 Other specified enthesopathies of unspecified lower limb, excluding foot: Secondary | ICD-10-CM | POA: Insufficient documentation

## 2011-12-28 DIAGNOSIS — M629 Disorder of muscle, unspecified: Secondary | ICD-10-CM | POA: Insufficient documentation

## 2011-12-28 DIAGNOSIS — M169 Osteoarthritis of hip, unspecified: Secondary | ICD-10-CM | POA: Insufficient documentation

## 2011-12-28 DIAGNOSIS — G8929 Other chronic pain: Secondary | ICD-10-CM | POA: Insufficient documentation

## 2011-12-28 DIAGNOSIS — M25559 Pain in unspecified hip: Secondary | ICD-10-CM | POA: Insufficient documentation

## 2011-12-28 DIAGNOSIS — C50919 Malignant neoplasm of unspecified site of unspecified female breast: Secondary | ICD-10-CM | POA: Insufficient documentation

## 2011-12-28 DIAGNOSIS — M129 Arthropathy, unspecified: Secondary | ICD-10-CM | POA: Insufficient documentation

## 2011-12-28 DIAGNOSIS — M2420 Disorder of ligament, unspecified site: Secondary | ICD-10-CM | POA: Insufficient documentation

## 2011-12-28 DIAGNOSIS — Z9889 Other specified postprocedural states: Secondary | ICD-10-CM | POA: Insufficient documentation

## 2011-12-28 DIAGNOSIS — Z96659 Presence of unspecified artificial knee joint: Secondary | ICD-10-CM | POA: Insufficient documentation

## 2011-12-28 DIAGNOSIS — M1610 Unilateral primary osteoarthritis, unspecified hip: Secondary | ICD-10-CM | POA: Insufficient documentation

## 2011-12-28 DIAGNOSIS — G894 Chronic pain syndrome: Secondary | ICD-10-CM

## 2011-12-29 ENCOUNTER — Telehealth: Payer: Self-pay | Admitting: *Deleted

## 2011-12-29 ENCOUNTER — Other Ambulatory Visit: Payer: Self-pay | Admitting: *Deleted

## 2011-12-29 DIAGNOSIS — C50519 Malignant neoplasm of lower-outer quadrant of unspecified female breast: Secondary | ICD-10-CM

## 2011-12-29 NOTE — Telephone Encounter (Signed)
Confirmed [facility] for 01/04/12 at 1200.  Instructions and contact information given.  

## 2011-12-29 NOTE — Assessment & Plan Note (Signed)
HISTORY OF PRESENT ILLNESS:  Sabrina Sullivan is a very pleasant 71 year old woman who was last seen by me on December 10, 2011.  She is followed [facility] at the [facility] for chronic pain complaints, predominantly related to her low back and hip region.  She carries diagnosis of lumbar facet arthropathy, mild fascia gluteal muscle dysfunction.  She is status post a right knee replacement and she also has had problems intermittently with trochanteric bursitis. She underwent left greater trochanter injection last month.  She reports that it was very, very helpful in relieving her pain.  She is able to lay on that side again and she is sleeping somewhat better.  The injection has significantly improved the pain in that particular area.  Significant changes in Sabrina Sullivan's past medical history includes a new diagnosis of breast cancer.  Apparently, it is stage I.  She has undergone biopsy and has further studies pending her health care team.  Sabrina Sullivan is upset by this news today.  Her average pain in the other areas are about 7-9 on a scale of 10, predominantly low back, buttock region.  Pain is described as burning, dull, tingling, aching in nature at times, worse in the evening and at night.  Sitting is somewhat difficult.  Sleep is fair.  Pain is improved with ice and medication.  She gets fair relief with current meds.  MEDICATIONS PRESCRIBED [facility]OUGH [facility]:  Include p.r.n.  Mobic 7.5 mg, she rarely takes this as well as tramadol 1-2 tablets every 8 hours.  She reports fair to good relief with these medications.  FUNCTIONAL STATUS:  No changes in functional status.  She is independent with self-care.  She is able to walk at least 15-20 minutes up to 1/2 hour.  She is able to climb stairs.  She drives.  PAST MEDICAL/SOCIAL/FAMILY HISTORY:  As noted above.  No other changes in social or family history.  PHYSICAL EXAMINATION:  VITAL SIGNS:  Her blood  pressure is 147/90, pulse is 60, respirations 18, and 97% saturated on room air. GENERAL:  She is a well-developed, well-nourished woman who does not appear in any distress.  She is oriented x3. NEUROLOGIC:  Speech is clear.  Affect is alert.  She is cooperative and pleasant.  She is upset about her diagnosis of breast cancer.  She follows my commands without difficulty and answers my questions appropriately.  Cranial nerves, coordination are intact.  Reflexes are 1+ in the bilateral lower extremities.  No abnormal tone, clonus, or tremors are noted on.  No new sensory deficits are appreciated.  Transitions easily from sitting to standing.  Gait is not antalgic. Tandem gait and Romberg test are performed adequately.  No sensorimotor deficits are appreciated in the lower extremities today.  IMPRESSION: 1. Lumbago with history of facet arthropathy. 2. History of mild degenerative joint disease of hip. 3. Myofascial gluteal muscle dysfunction/ischial bursitis. 4. Left trochanteric bursitis, overall improved status post injection. 5. Status post cervical surgery, Dr. Newell Coral.  The patient has     recently followed up with Dr. Yevette Edwards who saw her.  A CT scan of     the neck was done in August and Dr. Yevette Edwards recommended continued     use of tramadol and Mobic and consideration of left-sided C5     selective nerve root block should she have worsening pain in this     upper extremity/shoulder area.  PLAN:  At this point, we will  continue to follow Sabrina Sullivan.  She does not need a refill on any of her medications.  At this time, she did very well status post left hip injection last month.  We will give her prescription for a gel cushion for seating given her ischial bursitis. I will see her back in about 6 weeks.  She has had a lumbar facet injection back in December 2009.  At that time, she got 75% pain relief. She is currently not interested in pursuing any type of injection at this  time.  I have answered all her questions.  She is comfortable with our management plan.     Brantley Stage, M.D.    DMK/MedQ D:  12/28/2011 13:19:56  T:  12/29/2011 40:98:11  Job #:  [id number]

## 2012-01-02 ENCOUNTER — Ambulatory Visit: Payer: PRIVATE HEALTH INSURANCE | Admitting: Physical Medicine and Rehabilitation

## 2012-01-02 ENCOUNTER — Ambulatory Visit
Admission: RE | Admit: 2012-01-02 | Discharge: 2012-01-02 | Disposition: A | Discharge status: Home / Self Care | Payer: PRIVATE HEALTH INSURANCE | Source: Ambulatory Visit | Attending: Obstetrics and Gynecology | Admitting: Obstetrics and Gynecology

## 2012-01-02 DIAGNOSIS — C50911 Malignant neoplasm of unspecified site of right female breast: Secondary | ICD-10-CM

## 2012-01-02 MED ORDER — GADOBENATE DIMEGLUMINE 529 MG/ML IV SOLN: 11.0000 mL | Freq: Once | INTRAVENOUS | Status: AC | PRN

## 2012-01-04 ENCOUNTER — Ambulatory Visit
Admission: RE | Admit: 2012-01-04 | Discharge: 2012-01-04 | Disposition: A | Discharge status: Home / Self Care | Payer: PRIVATE HEALTH INSURANCE | Source: Ambulatory Visit | Attending: Radiation Oncology | Admitting: Radiation Oncology

## 2012-01-04 ENCOUNTER — Encounter: Payer: Self-pay | Admitting: Oncology

## 2012-01-04 ENCOUNTER — Ambulatory Visit (HOSPITAL_BASED_OUTPATIENT_CLINIC_OR_DEPARTMENT_OTHER): Payer: PRIVATE HEALTH INSURANCE | Admitting: Oncology

## 2012-01-04 ENCOUNTER — Ambulatory Visit: Payer: PRIVATE HEALTH INSURANCE

## 2012-01-04 ENCOUNTER — Other Ambulatory Visit: Payer: PRIVATE HEALTH INSURANCE | Admitting: Lab

## 2012-01-04 ENCOUNTER — Ambulatory Visit (HOSPITAL_BASED_OUTPATIENT_CLINIC_OR_DEPARTMENT_OTHER): Payer: PRIVATE HEALTH INSURANCE | Admitting: General Surgery

## 2012-01-04 ENCOUNTER — Telehealth: Payer: Self-pay | Admitting: *Deleted

## 2012-01-04 ENCOUNTER — Ambulatory Visit: Payer: PRIVATE HEALTH INSURANCE | Attending: General Surgery | Admitting: Physical Therapy

## 2012-01-04 VITALS — BP 132/84 | HR 65 | Temp 98.3°F | Ht 63.0 in | Wt 136.2 lb

## 2012-01-04 DIAGNOSIS — C50419 Malignant neoplasm of upper-outer quadrant of unspecified female breast: Secondary | ICD-10-CM

## 2012-01-04 DIAGNOSIS — IMO0001 Reserved for inherently not codable concepts without codable children: Secondary | ICD-10-CM | POA: Insufficient documentation

## 2012-01-04 DIAGNOSIS — R293 Abnormal posture: Secondary | ICD-10-CM | POA: Insufficient documentation

## 2012-01-04 DIAGNOSIS — C50919 Malignant neoplasm of unspecified site of unspecified female breast: Secondary | ICD-10-CM | POA: Insufficient documentation

## 2012-01-04 DIAGNOSIS — C50519 Malignant neoplasm of lower-outer quadrant of unspecified female breast: Secondary | ICD-10-CM

## 2012-01-04 DIAGNOSIS — C50411 Malignant neoplasm of upper-outer quadrant of right female breast: Secondary | ICD-10-CM | POA: Insufficient documentation

## 2012-01-04 DIAGNOSIS — Z170 Estrogen receptor positive status [ER+]: Secondary | ICD-10-CM

## 2012-01-04 LAB — CBC WITH DIFFERENTIAL/PLATELET
BASO%: 0.6 % (ref 0.0–2.0)
EOS%: 1.2 % (ref 0.0–7.0)
LYMPH%: 32.6 % (ref 14.0–49.7)
MCHC: 33.9 g/dL (ref 31.5–36.0)
MONO#: 0.6 10*3/uL (ref 0.1–0.9)
MONO%: 12.5 % (ref 0.0–14.0)
Platelets: 161 10*3/uL (ref 145–400)
RBC: 4.35 10*6/uL (ref 3.70–5.45)
WBC: 4.8 10*3/uL (ref 3.9–10.3)

## 2012-01-04 LAB — COMPREHENSIVE METABOLIC PANEL
ALT: 13 U/L (ref 0–35)
AST: 20 U/L (ref 0–37)
Alkaline Phosphatase: 47 U/L (ref 39–117)
CO2: 28 mEq/L (ref 19–32)
Sodium: 141 mEq/L (ref 135–145)
Total Bilirubin: 0.4 mg/dL (ref 0.3–1.2)
Total Protein: 7 g/dL (ref 6.0–8.3)

## 2012-01-04 LAB — CANCER ANTIGEN 27.29: CA 27.29: 9 U/mL (ref 0–39)

## 2012-01-04 NOTE — Progress Notes (Signed)
Chief Complaint:  New diagnosis of R breast cancer  HISTORY: Pt presents with screening detected R breast cancer.  An 5 mm area of microcalcifications generated a callback for diagnostic mammogram.  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  She has a daughter with breast cancer as well, and her mother had uterine cancer. She has not had to have prior breast biopsies, and has missed several mammograms.  She had menarche at age 71, is post menopausal.  She did not use HRT.  She has 3 children, first at age 53.  She did use OCPs for 11 years.  She currently has breast pain after her biopsy, but prior to that was asymptomatic.  She is a former [employer].  She has significant low back pain from her spondylosis.    Past Medical History  Diagnosis Date  . Rosacea   . High cholesterol   . Congestive heart failure   . Renal failure   . Low back pain   . Left arm pain   . Neck pain   . Thoracic back pain     Past Surgical History  Procedure Date  . Abdominal hysterectomy   . Cervical fusion   . Rotator cuff repair   . Replacement total knee     Current Outpatient Prescriptions  Medication Sig Dispense Refill  . diclofenac sodium (VOLTAREN) 1 % GEL Apply topically.      Marland Kitchen doxycycline (VIBRAMYCIN) 50 MG capsule Take 50 mg by mouth every other day. FOR ROSACEA      . esomeprazole (NEXIUM) 40 MG capsule Take 40 mg by mouth daily.      . meloxicam (MOBIC) 7.5 MG tablet Take 7.5 mg by mouth daily.      . pravastatin (PRAVACHOL) 20 MG tablet Take 20 mg by mouth daily.      . raloxifene (EVISTA) 60 MG tablet Take 60 mg by mouth daily.      . traMADol (ULTRAM) 50 MG tablet Take 50 mg by mouth every 8 (eight) hours as needed. 1 - 2  EVERY 8 HOURS PRN         Allergies  Allergen Reactions  . Hydrocodone Itching  . Mobic Itching and Other (See Comments)    FACIAL FLUSHING  . Morphine And Related Itching  . Ultram (Tramadol Hcl) Itching     Family History  Problem Relation  Age of Onset  . Cancer Mother   . Cancer Maternal Uncle   . Cancer Daughter      History   Social History  . Marital Status: Divorced    Spouse Name: N/A    Number of Children: N/A  . Years of Education: N/A   Social History Main Topics  . Smoking status: Former Smoker -- 0.5 packs/day    Quit date: 01/22/2008  . Smokeless tobacco: Not on file  . Alcohol Use: Yes  . Drug Use:   . Sexually Active:    Other Topics Concern  . Not on file   Social History Narrative  . No narrative on file     REVIEW OF SYSTEMS - PERTINENT POSITIVES ONLY: 12 point review of systems negative other than HPI and PMH except for lumbar pain, ringing ears, urinary urgency, and rosacea.  EXAM: Wt Readings from Last 3 Encounters:  01/04/12 136 lb 3.2 oz (61.78 kg)   Temp Readings from Last 3 Encounters:  01/04/12 98.3 F (36.8 C) Oral   BP Readings from Last 3 Encounters:  01/04/12 132/84  Pulse Readings from Last 3 Encounters:  01/04/12 65    Gen:  No acute distress.  Well nourished and well groomed.   Neurological: Alert and oriented to person, place, and time. Coordination normal. Slightly stiff Head: Normocephalic and atraumatic.  Eyes: Conjunctivae are normal. Pupils are equal, round, and reactive to light. No scleral icterus.  Neck: Normal range of motion. Neck supple. No tracheal deviation or thyromegaly present.  Cardiovascular: Normal rate, regular rhythm, normal heart sounds and intact distal pulses.  Exam reveals no gallop and no friction rub.  No murmur heard. Respiratory: Effort normal.  No respiratory distress. No chest wall tenderness. Breath sounds normal.  No wheezes, rales or rhonchi.  Breasts:  No palpable mass in either breast. R breast very sore and bruised from biopsy.  No nipple retraction or skin dimpling.  No axillary adenopathy.   GI: Soft. Bowel sounds are normal. The abdomen is soft and nontender.  There is no rebound and no guarding.  Musculoskeletal: Normal  range of motion. Extremities are nontender.  Lymphadenopathy: No cervical, preauricular, postauricular or axillary adenopathy is present Skin: Skin is warm and dry. No rash noted. No diaphoresis. No erythema. No pallor. No clubbing, cyanosis, or edema.   Psychiatric: Normal mood and affect. Behavior is normal. Judgment and thought content normal.    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 No evidence  of multifocal, multicentric or contralateral neoplasm. No abnormal-  appearing lymph nodes.  Moderate to marked normal background parenchymal enhancement  slightly limits sensitivity.   Diagnostic mammogram IMPRESSION:  Clustered pleomorphic microcalcifications in the right posterior  lower outer quadrant. Stereotactic core needle biopsy is  suggested. This has been scheduled for 12/26/2011.  BI-RADS CATEGORY 4: Suspicious abnormality - biopsy should be  considered.   ASSESSMENT AND PLAN: Cancer of upper-outer quadrant of female breast cT1a, N0M0 Plan breast conservation therapy.  Pt good candidate for lumpectomy/sentinel lymph node.    The surgical procedure was described to the patient.  I discussed the incision type and location and whether we would need radiology involved on the day of surgery with a wire marker and/or sentinel node.      The risks and benefits of the procedure were described to the patient and he/she wishes to proceed.    We discussed the risks bleeding, infection, damage to other structures, need for further procedures/surgeries.  We discussed the risk of seroma.  The patient was advised if the area in the breast in cancer, we may need to go back to surgery for additional tissue to obtain negative margins or for a lymph node biopsy.  The patient was advised that these are the most common complications, but that others can occur as well.  They were advised against taking aspirin or other anti-inflammatory agents/blood thinners the week before surgery.        Maudry Diego MD Surgical Oncology, General and Endocrine Surgery [facility]    Visit Diagnoses: 1. Cancer of upper-outer quadrant of female breast     Primary Care Physician: Ralene Ok, MD, MD

## 2012-01-04 NOTE — Assessment & Plan Note (Signed)
cT1a, N0M0 Plan breast conservation therapy.  Pt good candidate for lumpectomy/sentinel lymph node.    The surgical procedure was described to the patient.  I discussed the incision type and location and whether we would need radiology involved on the day of surgery with a wire marker and/or sentinel node.      The risks and benefits of the procedure were described to the patient and he/she wishes to proceed.    We discussed the risks bleeding, infection, damage to other structures, need for further procedures/surgeries.  We discussed the risk of seroma.  The patient was advised if the area in the breast in cancer, we may need to go back to surgery for additional tissue to obtain negative margins or for a lymph node biopsy. The patient was advised that these are the most common complications, but that others can occur as well.  They were advised against taking aspirin or other anti-inflammatory agents/blood thinners the week before surgery.

## 2012-01-04 NOTE — Patient Instructions (Signed)
IF YOU ARE TAKING ASPIRIN, COUMADIN/WARFARIN, PLAVIX, OR OTHER BLOOD THINNER, PLEASE LET US KNOW IMMEDIATELY.  WE WILL NEED TO DISCUSS WITH THE PRESCRIBING PROVIDER IF THESE ARE SAFE TO STOP. IF THESE ARE NOT STOPPED AT THE APPROPRIATE TIME, THIS WILL RESULT IN A DELAY FOR YOUR SURGERY.  DO NOT TAKE THESE MEDICATIONS OR IBUPROFEN/NAPROXEN WITHIN A WEEK BEFORE SURGERY.   The main risks of surgery are bleeding, infection, damage to other structures, and seroma (accumulation of fluid) under the incision site(s).    These complications may lead to additional procedures such as drainage of seroma/infection.  If cancer is found, you may need other surgeries to obtain negative margins or to take more lymph nodes.   Most women do accumulate fluid in the breast cavity where the specimen was removed. We do not always have to drain this fluid.  If your breast is very tense, painful, or red, then we may need to numb the skin and use a needle to aspirate the fluid.  We do provide patients with a Breast Binder.  The purpose of this is to avoid the use of tape on the sensitive tissue of the breast and to provide some compression to minimize the risk of seroma.  If the binder is uncomfortable, you may find that a tank top with a built-in shelf bra or a loose sports bra works better for you.  I recommend wearing this around the clock for the first 1-2 weeks except in the shower.    You may remove your dressings and may shower 48 hours after surgery IF YOU DO NOT HAVE A DRAIN.    Many patients have some constipation in the week after surgery due to the narcotics and anesthesia.  You may need over the counter stool softeners or laxatives if you experience difficulty having bowel movements.    If the following occur, call our office at [phone]: If you have a fever over 101 or pain that is severe despite narcotics. If you have redness or drainage at the wound. If you develop persistent nausea or vomiting.  I  will follow you back up in 1-4 weeks.    Please submit any paperwork about time off work/insurance forms to the front desk.   

## 2012-01-04 NOTE — Telephone Encounter (Signed)
gave patient appointment for 02-2012. printed out calendar and gave to the patient 

## 2012-01-05 ENCOUNTER — Other Ambulatory Visit (INDEPENDENT_AMBULATORY_CARE_PROVIDER_SITE_OTHER): Payer: Self-pay | Admitting: General Surgery

## 2012-01-05 DIAGNOSIS — C50419 Malignant neoplasm of upper-outer quadrant of unspecified female breast: Secondary | ICD-10-CM

## 2012-01-05 NOTE — Consult Note (Signed)
Sabrina Sullivan [id number] [birth year] 71 y.o. 01/05/2012 6:25 PM  CC Dr. Almond Lint Dr. Campbell Lerner, MD, MD [address] [place] [address]  REASON FOR CONSULTATION:  71 year old female with new diagnosis of right breast cancer measuring 5 mm that was ER+PR- and He2Neu negative, seen today to discuss her treatment options  Patient was seen in the Multidisciplinary Breast Clinic for discussion of her treatment options. She was seen by Dr. Drue Second, Radiation Onc[facility]  STAGE:  Cancer of upper-outer quadrant of female breast   Primary site: Breast (Right)   Staging method: AJCC 7th Edition   Clinical: Stage IA (T1a, N0, cM0)   Summary: Stage IA (T1a, N0, cM0)  REFERRING PHYSICIAN: Dr. Ralene Ok  HISTORY OF PRESENT ILLNESS:  Sabrina Sullivan is a 71 y.o. female presented for annual screening mammogram and was found to have a 5 mm right breast mass. She went onto have an ultrasound core needle biopsy with patology showing an invsive ductal carcinoma, ER+, PR- and Her2Neu-. She had a MRI of breast performed that showed post biopsy enhancement No abnormal lymph nodes. She is with out any comlpalints.   Past Medical History: Past Medical History  Diagnosis Date  . Rosacea   . High cholesterol   . Congestive heart failure   . Renal failure   . Low back pain   . Left arm pain   . Neck pain   . Thoracic back pain     Past Surgical History: Past Surgical History  Procedure Date  . Abdominal hysterectomy   . Cervical fusion   . Rotator cuff repair   . Replacement total knee     Family History: Family History  Problem Relation Age of Onset  . Cancer Mother   . Cancer Maternal Uncle   . Cancer Daughter     Social History History  Substance Use Topics  . Smoking status: Former Smoker -- 0.5 packs/day    Quit date: 01/22/2008  . Smokeless tobacco: Not on file  . Alcohol Use: Yes     Allergies: Allergies  Allergen Reactions  . Hydrocodone Itching  . Mobic Itching and Other (See Comments)    FACIAL FLUSHING  . Morphine And Related Itching  . Ultram (Tramadol Hcl) Itching    Current Medications: Current Outpatient Prescriptions  Medication Sig Dispense Refill  . diclofenac sodium (VOLTAREN) 1 % GEL Apply topically.      Marland Kitchen doxycycline (VIBRAMYCIN) 50 MG capsule Take 50 mg by mouth every other day. FOR ROSACEA      . esomeprazole (NEXIUM) 40 MG capsule Take 40 mg by mouth daily.      . meloxicam (MOBIC) 7.5 MG tablet Take 7.5 mg by mouth daily.      . pravastatin (PRAVACHOL) 20 MG tablet Take 20 mg by mouth daily.      . raloxifene (EVISTA) 60 MG tablet Take 60 mg by mouth daily.      . traMADol (ULTRAM) 50 MG tablet Take 50 mg by mouth every 8 (eight) hours as needed. 1 - 2  EVERY 8 HOURS PRN        OB/GYN History:menarche at 10, she is post menopausal no HRT use, she has 3 kids, first live birth at 31  Fertility Discussion:N/A Prior History of Cancer:N/A   ECOG PERFORMANCE STATUS: 0 - Asymptomatic  Genetic Counseling/testing: none  REVIEW OF SYSTEMS:  A comprehensive review of systems was negative.  PHYSICAL  EXAMINATION: Blood pressure 132/84, pulse 65, temperature 98.3 F (36.8 C), temperature source Oral, height 5\' 3"  (1.6 m), weight 136 lb 3.2 oz (61.78 kg).  NWG:NFAOZ, healthy and no distress SKIN: skin color, texture, turgor are normal HEAD: No masses, lesions, tenderness or abnormalities EYES: PERRLA, EOMI, Conjunctiva are pink and non-injected, sclera clear EARS: External ears normal OROPHARYNX:no exudate, no erythema and lips, buccal mucosa, and tongue normal  NECK: supple, no adenopathy, no bruits, no JVD, thyroid normal size, non-tender, without nodularity LYMPH:  no palpable lymphadenopathy, no hepatosplenomegaly BREAST:right breast with palpable hematoma LUNGS: clear to auscultation and percussion HEART: regular rate & rhythm, no  murmurs and no gallops ABDOMEN:abdomen soft, non-tender, normal bowel sounds and no masses or organomegaly BACK: Back symmetric, no curvature., No CVA tenderness EXTREMITIES:no edema, no clubbing, no cyanosis  NEURO: alert & oriented x 3 with fluent speech, no focal motor/sensory deficits, gait normal, reflexes normal and symmetric   STUDIES/RESULTS: Mr Breast Bilateral W Wo Contrast  01/02/2012  *RADIOLOGY REPORT*  Clinical Data: 71 year old female with recently diagnosed right breast cancer.  BUN and creatinine were obtained on site at [facility] at [address] Results:  BUN 14 mg/dL,  Creatinine 0.9 mg/dL.  BILATERAL BREAST MRI WITH AND WITHOUT CONTRAST  Technique: Multiplanar, multisequence MR images of both breasts were obtained prior to and following the intravenous administration of 11ml of Multihance.  Three dimensional images were evaluated at the independent DynaCad workstation.  Comparison:  [facility] mamm[facility] from [facility].  Findings: Moderate to marked normal background parenchymal enhancement is noted bilaterally with multiple scattered foci, slightly limiting sensitivity.  Biopsy clip artifact in the outer upper right breast is identified with minimal adjacent postbiopsy enhancement/biopsy-proven neoplasm.  No other suspicious areas of enhancement are identified within the breasts bilaterally.  No abnormal appearing or enlarged lymph nodes are noted. The bony structures and upper liver are unremarkable.  IMPRESSION: Minimal postbiopsy enhancement/biopsy-proven neoplasm adjacent to biopsy clip artifact in the upper outer right breast.  No evidence of multifocal, multicentric or contralateral neoplasm.  No abnormal- appearing lymph nodes.  Moderate to marked normal background parenchymal enhancement slightly limits sensitivity.  THREE-DIMENSIONAL MR IMAGE RENDERING ON INDEPENDENT WORKSTATION:  Three-dimensional MR images were rendered by post-processing of the original  MR data on an independent workstation.  The three- dimensional MR images were interpreted, and findings were reported in the accompanying complete MRI report for this study.  BI-RADS CATEGORY 6:  Known biopsy-proven malignancy - appropriate action should be taken.  Recommend treatment plan.  Original Report Authenticated By: Rosendo Gros, M.D.   Mm Breast Stereo Biopsy Right  12/26/2011  *RADIOLOGY REPORT*  Clinical Data:  Suspicious right breast calcifications  STEREOTACTIC-GUIDED VACUUM ASSISTED BIOPSY OF THE RIGHT BREAST AND SPECIMEN RADIOGRAPH  I met with the patient and we discussed the procedure of steriotactic-guided biopsy, including benefits and alternatives. We discussed the high likelihood of a successful procedure. We discussed the risks of the procedure, including infection, bleeding, tissue injury, clip migration, and inadequate sampling. Informed, written consent was given.  Using sterile technique, 2% lidocaine, stereotactic guidance, and a 9 gauge vacuum assisted device, biopsy was performed of the calcifications in the upper outer quadrant of the right breast. Specimen radiograph was performed, showing calcifications were present in the tissue samples.  Specimens with calcifications are identified for pathology.  At the conclusion of the procedure, a tissue marker clip was deployed into the biopsy cavity.  Follow-up 2-view mammogram confirmed clip .  IMPRESSION: Stereotactic-guided biopsy of the right breast.  No apparent complications.  Original Report Authenticated By: Littie Deeds. Judyann Munson, M.D.   Mm Breast Surgical Specimen  12/26/2011  *RADIOLOGY REPORT*  Clinical Data:  Suspicious right breast calcifications  STEREOTACTIC-GUIDED VACUUM ASSISTED BIOPSY OF THE RIGHT BREAST AND SPECIMEN RADIOGRAPH  I met with the patient and we discussed the procedure of steriotactic-guided biopsy, including benefits and alternatives. We discussed the high likelihood of a successful procedure. We discussed the  risks of the procedure, including infection, bleeding, tissue injury, clip migration, and inadequate sampling. Informed, written consent was given.  Using sterile technique, 2% lidocaine, stereotactic guidance, and a 9 gauge vacuum assisted device, biopsy was performed of the calcifications in the upper outer quadrant of the right breast. Specimen radiograph was performed, showing calcifications were present in the tissue samples.  Specimens with calcifications are identified for pathology.  At the conclusion of the procedure, a tissue marker clip was deployed into the biopsy cavity.  Follow-up 2-view mammogram confirmed clip .  IMPRESSION: Stereotactic-guided biopsy of the right breast.  No apparent complications.  Original Report Authenticated By: Littie Deeds. Judyann Munson, M.D.   Mm Digital Diag Ltd R  12/19/2011  *RADIOLOGY REPORT*  Clinical Data:  The patient returns for evaluation of calcifications in the right breast noted on recent screening study dated 12/05/2011.  DIGITAL DIAGNOSTIC RIGHT LIMITED MAMMOGRAM  Comparison:  09/23/2003  Findings:  Magnification views demonstrate clustered calcifications in the right lower outer quadrant posteriorly.  These vary in size and shape. The cluster measures approximately 3 x 4 x 5 mm. The appearance is suspicious and biopsy is recommended.  Stereotactic core needle biopsy was discussed with the patient and she agreed with this plan.  IMPRESSION: Clustered pleomorphic microcalcifications in the right posterior lower outer quadrant.  Stereotactic core needle biopsy is suggested.  This has been scheduled for 12/26/2011.  BI-RADS CATEGORY 4:  Suspicious abnormality - biopsy should be considered.  Original Report Authenticated By: Daryl Eastern, M.D.   Mm Radiologist Eval And Mgmt  12/27/2011  *RADIOLOGY REPORT*  ESTABLISHED PATIENT OFFICE VISIT - LEVEL II [id number])  Chief Complaint:  The patient underwent stereotactic core needle biopsy of microcalcifications in the outer  portion of the right breast on 12/26/2011 and returns for discussion of the pathologic findings. The patient's daughter had breast cancer 5 years ago.  History:  Recent mammogram demonstrated clustered microcalcifications at 9 o'clock approximately 6 cm from the right nipple.  Stereotactic core needle biopsy was performed.  Exam:  Examination of the right biopsy site demonstrates mild ecchymosis with no sign of hematoma or infection.  The patient reports no complications from the procedure.  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 The carcinoma appears grade II.  This is concordant with the imaging findings.  Results were discussed with the patient and her daughter.  Her questions were answered.  She was given Field seismologist.  Breast MRI is scheduled for 01/02/2012. The patient is scheduled to be seen in the [facility] on 01/04/2012.  Original Report Authenticated By: Daryl Eastern, M.D.     LABS:    Chemistry      Component Value Date/Time   NA 141 01/04/2012 1149   K 4.5 01/04/2012 1149   CL 105 01/04/2012 1149   CO2 28 01/04/2012 1149   BUN 17 01/04/2012 1149   CREATININE 1.08 01/04/2012 1149  Component Value Date/Time   CALCIUM 9.9 01/04/2012 1149   ALKPHOS 47 01/04/2012 1149   AST 20 01/04/2012 1149   ALT 13 01/04/2012 1149   BILITOT 0.4 01/04/2012 1149      Lab Results  Component Value Date   WBC 4.8 01/04/2012   HGB 13.8 01/04/2012   HCT 40.7 01/04/2012   MCV 93.5 01/04/2012   PLT 161 Large platelets present 01/04/2012       PATHOLOGY: ADDITIONAL INFORMATION: CHROMOGENIC IN-SITU HYBRIDIZATION Interpretation HER-2/NEU BY CISH - NO AMPLIFICATION OF HER-2 DETECTED. THE RATIO OF HER-2: CEP 17 SIGNALS WAS 1.23. Reference range: Ratio: HER2:CEP17 < 1.8 - gene amplification not observed Ratio: HER2:CEP 17 1.8-2.2 - equivocal result Ratio:  HER2:CEP17 > 2.2 - gene amplification observed H. CATHERINE LI MD Pathologist, Electronic Signature ( Signed 12/30/2011) PROGNOSTIC INDICATORS - ACIS Results IMMUNOHISTOCHEMICAL AND MORPHOMETRIC ANALYSIS BY THE AUTOMATED CELLULAR IMAGING SYSTEM (ACIS) Estrogen Receptor (Negative, <1%): 65%, MODERATE STAINING INTENSITY Progesterone Receptor (Negative, <1%): 0%, NEGATIVE Proliferation Marker Ki67 by M IB-1 (Low<20%): 12% COMMENT: The negative hormone receptor study in this case has an internal positive control. All controls stained appropriately FINAL DIAGNOSIS 1 of 2 FINAL for Afonso, Sharae W ([id number]) Diagnosis Breast, right, needle core biopsy, UOQ - INVASIVE DUCTAL CARCINOMA. - DUCTAL CARCINOMA IN SITU WITH CALCIFICATIONS. - BENIGN LOBULES WITH CALCIFICATIONS. - SEE COMMENT. Microscopic Comment Although grade is Veron determined at time of surgical excision, the carcinoma appears grade II. A breast prognostic profile will be performed and the results reported separately. The results were called to [facility] on 12/27/2011. (JBK:eps 12/27/11) Pecola Leisure MD Pathologist, Electronic Signature  ASSESSMENT    71 year old with: 1. Stage Ia IDC ER+PR- HER2Neu- ki-67 12%, measuring 5mm 2. Seen in [facility] for treatment options  Multidisciplinary conference discussion: recommend lumpectomy/SNL followed by RT and then anti-estrogen    PLAN:    1. Lumpectomy with SNL  2. Radiation therapy post lumpectomy 3. Adjuvant anti-estrogen therapy in ER+ stage IA IDC per NCCN guidelines       Discussion: Patient is being treated per NCCN breast cancer care guidelines appropriate for stage.IA   Thank you so much for allowing me to participate in the care of Sabrina Sullivan. I will continue to follow up the patient with you and assist in her care.  All questions were answered. The patient knows to call the clinic with any problems, questions or concerns. We can certainly see  the patient much sooner if necessary.  I spent 40 minutes counseling the patient face to face. The total time spent in the appointment was 50 minutes.  Drue Second, MD Medical/Oncology [facility] [phone] (beeper) [phone] (Office)  01/05/2012, 6:25 PM 01/05/2012, 6:25 PM

## 2012-01-05 NOTE — Progress Notes (Signed)
CC:   Sabrina Ang, NP Ralene Ok, M.D. Drue Second, M.D. Almond Lint, MD   DIAGNOSIS:  Invasive ductal carcinoma of the right breast, clinical stage I.  REQUESTING PHYSICIAN:  Sabrina Ang, NP  HISTORY OF PRESENT ILLNESS:  Sabrina Sullivan is a very pleasant 71 year old female who is seen out of the courtesy of Dr. Maurice March for opinion concerning radiation therapy as part of the management of the patient's recently diagnosed right breast cancer.  Sabrina Sullivan has not had a mammogram in approximately 10 years.  At the urging of her nurse practitioner, the patient did proceed with screening mammography on December 05, 2011.  This did show some calcifications within the right breast, and additional imaging was recommended.  Digital diagnostic right limited mammogram did reveal clustered calcifications within the right lower-outer quadrant posteriorly.  This measured approximately 3 x 4 x 5 mm.  The patient did proceed to undergo biopsy of this area with pathology significant for invasive ductal carcinoma, likely grade 2. Tumor was estrogen receptor positive at 65% with moderate staining intensity and progesterone receptor negative.  Ki-67 was low at 12%. There was no HER-2/neu amplification.  The patient did proceed to undergo MRI of the chest/breast area, which showed some post biopsy changes in the upper-outer aspect of the right breast but no evidence of dominant tumor or lymph node involvement.  With these findings, the patient is now seen in the multidisciplinary breast clinic.  PAST MEDICAL HISTORY:  The patient has an allergy to morphine which causes itching.  CURRENT MEDICATIONS:  Include alprazolam 0.5 mg twice a day, Evista 60 mg daily, doxycycline 50 mg daily, pravastatin 20 mg daily, tramadol 50 mg 1-2 daily as needed, and meloxicam 7.5 mg as needed.  The patient also takes a calcium and vitamin D supplement.  The patient does have a history of lower lumbar spinal stenosis  and as a consequence is unable to work.  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  The patient also has a history of interstitial cystitis, a history of rosacea.  She has had a rotator cuff surgery, hysterectomy and right knee replacement.  SOCIAL HISTORY:  The patient is retired and divorced.  She has three children, ages 49, 22 and 94.  The patient previously smoked a half-pack of cigarettes per day but stopped smoking in March of 2009.  The patient has occasional alcohol intake.  FAMILY HISTORY:  Significant for a daughter diagnosed with breast cancer who underwent breast-conserving therapy and is 5 years out since diagnosis.  The patient's daughter was diagnosed at age 32.  The patient's mother has a history of uterine cancer diagnosed at age 103, and paternal uncle with a history of lung cancer at age 55.  REVIEW OF SYSTEMS:  Prior to diagnosis, the patient denied any pain in the breast area, nipple discharge or bleeding.  The patient has chronic low-back pain.  She denies any headaches, dizziness or blurred vision.  PHYSICAL EXAMINATION:  General:  This is a very pleasant, healthy appearing female in no acute distress.  Examination of the neck and supraclavicular region reveals no evidence of adenopathy.  The axillary areas are free of adenopathy.  Examination of the lungs reveals them to be clear.  The heart has a regular rhythm and rate.  The abdomen is soft and nontender with normal bowel sounds.  Examination of the left breast reveals no mass or nipple discharge.  Examination of the right breast reveals some bruising in the  lateral aspect from the patient's most recent biopsy.  There is no dominant mass appreciated in the breast, nipple discharge or bleeding.  IMPRESSION AND PLAN:  Clinical stage I invasive ductal carcinoma of the right breast.  Initially, I felt the patient may possibly be a candidate for partial breast radiation therapy with MammoSite  RTS.  However, when lying recumbent the patient's breast tissue flattens out significantly, and I suspect that the chest wall and skin dose may be significant if this treatment approach were chosen.  In light of this, I would recommend conventional radiation therapy covering the right breast treatments.  The patient may potentially be a candidate for hypofractionated accelerated treatment over 4 weeks.  The patient is interested in breast conservation therapy and will be seeing Dr. Donell Beers later today to discuss lumpectomy and sentinel node procedure.  The patient will be seen in the postoperative setting for further discussion of radiation therapy as breast-conserving treatment.  In addition, the patient will be seeing Dr. Welton Flakes later today for medical oncology input.    ______________________________ Billie Lade, Ph.D., M.D. JDK/MEDQ  D:  01/04/2012  T:  01/05/2012  Job:  [id number]

## 2012-01-05 NOTE — Progress Notes (Signed)
Notes filed for 2/13

## 2012-01-09 ENCOUNTER — Telehealth: Payer: Self-pay | Admitting: *Deleted

## 2012-01-09 ENCOUNTER — Encounter: Payer: Self-pay | Admitting: *Deleted

## 2012-01-09 NOTE — Telephone Encounter (Signed)
Spoke to pt concerning [facility] from 2/13.  Pt denies questions or concerns regarding dx or treatment care plan.  Encourage pt to call with needs.  Received verbal understanding.  Contact information given.

## 2012-01-11 ENCOUNTER — Encounter: Payer: Self-pay | Admitting: *Deleted

## 2012-01-11 ENCOUNTER — Ambulatory Visit: Payer: PRIVATE HEALTH INSURANCE | Admitting: Radiation Oncology

## 2012-01-11 ENCOUNTER — Ambulatory Visit: Payer: PRIVATE HEALTH INSURANCE

## 2012-01-11 NOTE — Progress Notes (Signed)
Mailed after appt letter to pt. 

## 2012-01-19 ENCOUNTER — Encounter (HOSPITAL_COMMUNITY): Payer: Self-pay

## 2012-01-23 ENCOUNTER — Encounter: Payer: Self-pay | Admitting: *Deleted

## 2012-01-23 NOTE — Progress Notes (Signed)
Clinical Social Work met with pt to complete [employer].  CSW and pt reviewed Living Will and Health Care Power of [place] documents.  All documentation was completed and notarized.  A copy of pt's advance directives was given to medical records to be scanned into pt's records.  CSW provided pt with the original documents and additional copies for her personal records.  CSW encouraged pt to call if she had any questions or concerns.    Tamala Julian, MSW, LCSW Clinical Social Worker [facility] [phone]

## 2012-01-24 NOTE — Pre-Procedure Instructions (Signed)
[address] Dew[address] Ammirati Lenhardt  01/24/2012   Your procedure is scheduled on:  Thursday February 02, 2012 at 1000 am.  Report to [facility] at 0800 AM.  Call this number if you have problems the morning of surgery: [phone]   Remember:   Do not eat food:After Midnight.  May have clear liquids: up to 4 Hours before arrival until 0400 am.  Clear liquids include soda, tea, black coffee, apple or grape juice, broth.  Take these medicines the morning of surgery with A SIP OF WATER: Xanax, Vibramycin, Esomeprazole, and Tramadol if needed for pain.    Do not wear jewelry, make-up or nail polish.  Do not wear lotions, powders, or perfumes. You may wear deodorant.  Do not shave 48 hours prior to surgery.  Do not bring valuables to the hospital.  Contacts, dentures or bridgework may not be worn into surgery.  Leave suitcase in the car. After surgery it may be brought to your room.  For patients admitted to the hospital, checkout time is 11:00 AM the day of discharge.   Patients discharged the day of surgery will not be allowed to drive home.  Name and phone number of your driver:   Special Instructions: CHG Shower Use Special Wash: 1/2 bottle night before surgery and 1/2 bottle morning of surgery.   Please read over the following fact sheets that you were given: Pain Booklet, Coughing and Deep Breathing and Surgical Site Infection Prevention

## 2012-01-25 ENCOUNTER — Inpatient Hospital Stay (HOSPITAL_COMMUNITY): Admission: RE | Admit: 2012-01-25 | Discharge: 2012-01-25 | Payer: PRIVATE HEALTH INSURANCE | Source: Ambulatory Visit

## 2012-01-27 ENCOUNTER — Encounter (HOSPITAL_COMMUNITY): Payer: Self-pay

## 2012-01-27 ENCOUNTER — Encounter (HOSPITAL_COMMUNITY)
Admission: RE | Admit: 2012-01-27 | Discharge: 2012-01-27 | Payer: PRIVATE HEALTH INSURANCE | Source: Ambulatory Visit | Attending: General Surgery | Admitting: General Surgery

## 2012-01-27 DIAGNOSIS — M1990 Unspecified osteoarthritis, unspecified site: Secondary | ICD-10-CM

## 2012-01-27 HISTORY — DX: Frequency of micturition: R35.0

## 2012-01-27 HISTORY — DX: Unspecified osteoarthritis, unspecified site: M19.90

## 2012-01-27 HISTORY — DX: Dental restoration status: Z98.811

## 2012-01-27 HISTORY — DX: Gastro-esophageal reflux disease without esophagitis: K21.9

## 2012-01-27 HISTORY — DX: Malignant (primary) neoplasm, unspecified: C80.1

## 2012-01-27 HISTORY — DX: Unspecified disorder of ear, unspecified ear: H93.90

## 2012-01-27 HISTORY — DX: Adverse effect of unspecified anesthetic, initial encounter: T41.45XA

## 2012-01-27 LAB — CBC
HCT: 42.9 % (ref 36.0–46.0)
MCHC: 33.1 g/dL (ref 30.0–36.0)
Platelets: 133 10*3/uL — ABNORMAL LOW (ref 150–400)
RDW: 13.1 % (ref 11.5–15.5)
WBC: 5.9 10*3/uL (ref 4.0–10.5)

## 2012-01-27 LAB — BASIC METABOLIC PANEL
Chloride: 103 mEq/L (ref 96–112)
Creatinine, Ser: 1.1 mg/dL (ref 0.50–1.10)
GFR calc Af Amer: 57 mL/min — ABNORMAL LOW (ref >90)
GFR calc non Af Amer: 49 mL/min — ABNORMAL LOW (ref >90)

## 2012-01-27 LAB — SURGICAL PCR SCREEN
MRSA, PCR: NEGATIVE
Staphylococcus aureus: NEGATIVE

## 2012-01-27 MED ORDER — CEFAZOLIN SODIUM 1-5 GM-% IV SOLN
1.0000 g | INTRAVENOUS | Status: AC
  Administered 2012-02-02: 1 g via INTRAVENOUS

## 2012-01-27 NOTE — Progress Notes (Signed)
EKG WAS RECEIVED FROM PCP ... AND IS ON THE CHART.

## 2012-01-27 NOTE — Progress Notes (Addendum)
SPOKE TO DR Ludwig Clarks  --REQ EKG ... [phone]

## 2012-02-02 ENCOUNTER — Encounter (HOSPITAL_COMMUNITY): Payer: Self-pay | Admitting: Anesthesiology

## 2012-02-02 ENCOUNTER — Other Ambulatory Visit (INDEPENDENT_AMBULATORY_CARE_PROVIDER_SITE_OTHER): Payer: Self-pay | Admitting: General Surgery

## 2012-02-02 ENCOUNTER — Ambulatory Visit (HOSPITAL_COMMUNITY): Payer: PRIVATE HEALTH INSURANCE | Admitting: Anesthesiology

## 2012-02-02 ENCOUNTER — Encounter (HOSPITAL_COMMUNITY): Payer: Self-pay | Admitting: *Deleted

## 2012-02-02 ENCOUNTER — Encounter (HOSPITAL_COMMUNITY)
Admission: RE | Disposition: A | Discharge status: Home / Self Care | Payer: Self-pay | Source: Ambulatory Visit | Attending: General Surgery

## 2012-02-02 ENCOUNTER — Ambulatory Visit (HOSPITAL_COMMUNITY)
Admission: RE | Admit: 2012-02-02 | Discharge: 2012-02-02 | Disposition: A | Discharge status: Home / Self Care | Payer: PRIVATE HEALTH INSURANCE | Source: Ambulatory Visit | Attending: General Surgery | Admitting: General Surgery

## 2012-02-02 ENCOUNTER — Ambulatory Visit
Admission: RE | Admit: 2012-02-02 | Discharge: 2012-02-02 | Disposition: A | Discharge status: Home / Self Care | Payer: PRIVATE HEALTH INSURANCE | Source: Ambulatory Visit | Attending: General Surgery | Admitting: General Surgery

## 2012-02-02 ENCOUNTER — Encounter (HOSPITAL_COMMUNITY): Payer: Self-pay | Admitting: General Surgery

## 2012-02-02 DIAGNOSIS — Z87891 Personal history of nicotine dependence: Secondary | ICD-10-CM | POA: Insufficient documentation

## 2012-02-02 DIAGNOSIS — D0590 Unspecified type of carcinoma in situ of unspecified breast: Secondary | ICD-10-CM | POA: Insufficient documentation

## 2012-02-02 DIAGNOSIS — C50519 Malignant neoplasm of lower-outer quadrant of unspecified female breast: Secondary | ICD-10-CM | POA: Insufficient documentation

## 2012-02-02 DIAGNOSIS — L719 Rosacea, unspecified: Secondary | ICD-10-CM | POA: Insufficient documentation

## 2012-02-02 DIAGNOSIS — K219 Gastro-esophageal reflux disease without esophagitis: Secondary | ICD-10-CM | POA: Insufficient documentation

## 2012-02-02 DIAGNOSIS — F411 Generalized anxiety disorder: Secondary | ICD-10-CM | POA: Insufficient documentation

## 2012-02-02 DIAGNOSIS — C50419 Malignant neoplasm of upper-outer quadrant of unspecified female breast: Secondary | ICD-10-CM

## 2012-02-02 DIAGNOSIS — Z01812 Encounter for preprocedural laboratory examination: Secondary | ICD-10-CM | POA: Insufficient documentation

## 2012-02-02 DIAGNOSIS — C50919 Malignant neoplasm of unspecified site of unspecified female breast: Secondary | ICD-10-CM

## 2012-02-02 DIAGNOSIS — N19 Unspecified kidney failure: Secondary | ICD-10-CM | POA: Insufficient documentation

## 2012-02-02 DIAGNOSIS — E7800 Pure hypercholesterolemia, unspecified: Secondary | ICD-10-CM | POA: Insufficient documentation

## 2012-02-02 HISTORY — PX: BREAST LUMPECTOMY: SHX2

## 2012-02-02 SURGERY — BREAST LUMPECTOMY WITH NEEDLE LOCALIZATION AND AXILLARY SENTINEL LYMPH NODE BX
Anesthesia: General | Site: Breast | Laterality: Right | Wound class: Clean

## 2012-02-02 MED ORDER — ACETAMINOPHEN 10 MG/ML IV SOLN
INTRAVENOUS | Status: DC | PRN
  Administered 2012-02-02: 1000 mg via INTRAVENOUS

## 2012-02-02 MED ORDER — LIDOCAINE HCL 1 % IJ SOLN
INTRAMUSCULAR | Status: DC | PRN
  Administered 2012-02-02: 11:00:00

## 2012-02-02 MED ORDER — OXYCODONE HCL 5 MG PO TABS
5.0000 mg | ORAL_TABLET | ORAL | Status: DC | PRN
  Administered 2012-02-02: 10 mg via ORAL

## 2012-02-02 MED ORDER — LACTATED RINGERS IV SOLN
INTRAVENOUS | Status: DC
  Administered 2012-02-02 (×2): via INTRAVENOUS

## 2012-02-02 MED ORDER — MIDAZOLAM HCL 5 MG/5ML IJ SOLN
INTRAMUSCULAR | Status: DC | PRN
  Administered 2012-02-02: 2 mg via INTRAVENOUS

## 2012-02-02 MED ORDER — ACETAMINOPHEN 650 MG RE SUPP: 650.0000 mg | RECTAL | Status: DC | PRN

## 2012-02-02 MED ORDER — LIDOCAINE HCL (CARDIAC) 20 MG/ML IV SOLN
INTRAVENOUS | Status: DC | PRN
  Administered 2012-02-02: 60 mg via INTRAVENOUS

## 2012-02-02 MED ORDER — SODIUM CHLORIDE 0.9 % IJ SOLN
INTRAMUSCULAR | Status: DC | PRN
  Administered 2012-02-02: 11:00:00

## 2012-02-02 MED ORDER — SODIUM CHLORIDE 0.9 % IJ SOLN
3.0000 mL | Freq: Two times a day (BID) | INTRAMUSCULAR | Status: DC
Start: 2012-02-02 — End: 2012-02-02

## 2012-02-02 MED ORDER — SODIUM CHLORIDE 0.9 % IJ SOLN: 3.0000 mL | INTRAMUSCULAR | Status: DC | PRN

## 2012-02-02 MED ORDER — PHENYLEPHRINE HCL 10 MG/ML IJ SOLN: INTRAMUSCULAR | Status: DC | PRN

## 2012-02-02 MED ORDER — DEXAMETHASONE SODIUM PHOSPHATE 10 MG/ML IJ SOLN
INTRAMUSCULAR | Status: DC | PRN
  Administered 2012-02-02: 8 mg via INTRAVENOUS

## 2012-02-02 MED ORDER — CEFAZOLIN SODIUM 1-5 GM-% IV SOLN
INTRAVENOUS | Status: AC
  Filled 2012-02-02: qty 50

## 2012-02-02 MED ORDER — KETOROLAC TROMETHAMINE 15 MG/ML IJ SOLN
INTRAMUSCULAR | Status: DC | PRN
  Administered 2012-02-02: 15 mg via INTRAVENOUS

## 2012-02-02 MED ORDER — PROMETHAZINE HCL 25 MG PO TABS
ORAL_TABLET | ORAL | Status: AC
  Administered 2012-02-02: 25 mg via ORAL
  Filled 2012-02-02: qty 1

## 2012-02-02 MED ORDER — SODIUM CHLORIDE 0.9 % IV SOLN: 250.0000 mL | INTRAVENOUS | Status: DC | PRN

## 2012-02-02 MED ORDER — HYDROMORPHONE HCL PF 1 MG/ML IJ SOLN
INTRAMUSCULAR | Status: AC
  Filled 2012-02-02: qty 1

## 2012-02-02 MED ORDER — 0.9 % SODIUM CHLORIDE (POUR BTL) OPTIME
TOPICAL | Status: DC | PRN
  Administered 2012-02-02: 1000 mL

## 2012-02-02 MED ORDER — PROPOFOL 10 MG/ML IV EMUL
INTRAVENOUS | Status: DC | PRN
  Administered 2012-02-02: 140 mL via INTRAVENOUS

## 2012-02-02 MED ORDER — ACETAMINOPHEN 10 MG/ML IV SOLN
INTRAVENOUS | Status: AC
  Filled 2012-02-02: qty 100

## 2012-02-02 MED ORDER — ONDANSETRON HCL 4 MG/2ML IJ SOLN: 4.0000 mg | Freq: Four times a day (QID) | INTRAMUSCULAR | Status: DC | PRN

## 2012-02-02 MED ORDER — ONDANSETRON HCL 4 MG/2ML IJ SOLN
INTRAMUSCULAR | Status: DC | PRN
  Administered 2012-02-02: 4 mg via INTRAVENOUS

## 2012-02-02 MED ORDER — OXYCODONE-ACETAMINOPHEN 5-325 MG PO TABS: 1.0000 | ORAL_TABLET | ORAL | Status: DC | PRN

## 2012-02-02 MED ORDER — ACETAMINOPHEN 325 MG PO TABS: 650.0000 mg | ORAL_TABLET | ORAL | Status: DC | PRN

## 2012-02-02 MED ORDER — HYDROMORPHONE HCL PF 1 MG/ML IJ SOLN
0.2500 mg | INTRAMUSCULAR | Status: DC | PRN
  Administered 2012-02-02 (×2): 0.5 mg via INTRAVENOUS
  Administered 2012-02-02: 0.25 mg via INTRAVENOUS
  Administered 2012-02-02: 0.5 mg via INTRAVENOUS

## 2012-02-02 MED ORDER — ONDANSETRON HCL 4 MG/2ML IJ SOLN: 4.0000 mg | Freq: Once | INTRAMUSCULAR | Status: DC | PRN

## 2012-02-02 MED ORDER — EPHEDRINE SULFATE 50 MG/ML IJ SOLN
INTRAMUSCULAR | Status: DC | PRN
  Administered 2012-02-02: 10 mg via INTRAVENOUS

## 2012-02-02 MED ORDER — TECHNETIUM TC 99M SULFUR COLLOID FILTERED
1.0000 | Freq: Once | INTRAVENOUS | Status: AC | PRN
  Administered 2012-02-02: 1 via INTRADERMAL

## 2012-02-02 MED ORDER — FENTANYL CITRATE 0.05 MG/ML IJ SOLN
INTRAMUSCULAR | Status: DC | PRN
  Administered 2012-02-02: 100 ug via INTRAVENOUS
  Administered 2012-02-02 (×3): 50 ug via INTRAVENOUS

## 2012-02-02 MED ORDER — PROMETHAZINE HCL 25 MG PO TABS
25.0000 mg | ORAL_TABLET | Freq: Once | ORAL | Status: AC
  Administered 2012-02-02: 25 mg via ORAL

## 2012-02-02 SURGICAL SUPPLY — 66 items
ADH SKN CLS APL DERMABOND .7 (GAUZE/BANDAGES/DRESSINGS) ×1
APPLIER CLIP 9.375 MED OPEN (MISCELLANEOUS)
APR CLP MED 9.3 20 MLT OPN (MISCELLANEOUS)
BANDAGE GAUZE ELAST BULKY 4 IN (GAUZE/BANDAGES/DRESSINGS) ×1 IMPLANT
BINDER BREAST LRG (GAUZE/BANDAGES/DRESSINGS) IMPLANT
BINDER BREAST XLRG (GAUZE/BANDAGES/DRESSINGS) IMPLANT
BLADE SURG 10 STRL SS (BLADE) ×2 IMPLANT
BLADE SURG 15 STRL LF DISP TIS (BLADE) ×1 IMPLANT
BLADE SURG 15 STRL SS (BLADE) ×2
CANISTER SUCTION 2500CC (MISCELLANEOUS) ×2 IMPLANT
CHLORAPREP W/TINT 26ML (MISCELLANEOUS) ×2 IMPLANT
CLIP APPLIE 9.375 MED OPEN (MISCELLANEOUS) ×1 IMPLANT
CLIP TI LARGE 6 (CLIP) ×1 IMPLANT
CLIP TI MEDIUM 6 (CLIP) ×1 IMPLANT
CLIP TI WIDE RED SMALL 6 (CLIP) ×1 IMPLANT
CLOTH BEACON ORANGE TIMEOUT ST (SAFETY) ×2 IMPLANT
CONT SPEC STER OR (MISCELLANEOUS) ×3 IMPLANT
COVER PROBE W GEL 5X96 (DRAPES) ×2 IMPLANT
COVER SURGICAL LIGHT HANDLE (MISCELLANEOUS) ×2 IMPLANT
DECANTER SPIKE VIAL GLASS SM (MISCELLANEOUS) ×1 IMPLANT
DERMABOND ADVANCED (GAUZE/BANDAGES/DRESSINGS) ×1
DERMABOND ADVANCED .7 DNX12 (GAUZE/BANDAGES/DRESSINGS) IMPLANT
DEVICE DUBIN SPECIMEN MAMMOGRA (MISCELLANEOUS) ×2 IMPLANT
DRAPE CHEST BREAST 15X10 FENES (DRAPES) ×2 IMPLANT
ELECT CAUTERY BLADE 6.4 (BLADE) ×2 IMPLANT
ELECT REM PT RETURN 9FT ADLT (ELECTROSURGICAL) ×2
ELECTRODE REM PT RTRN 9FT ADLT (ELECTROSURGICAL) ×1 IMPLANT
GLOVE BIO SURGEON STRL SZ 6 (GLOVE) ×2 IMPLANT
GLOVE BIO SURGEON STRL SZ7 (GLOVE) ×1 IMPLANT
GLOVE BIO SURGEON STRL SZ7.5 (GLOVE) ×3 IMPLANT
GLOVE BIOGEL PI IND STRL 6.5 (GLOVE) ×1 IMPLANT
GLOVE BIOGEL PI IND STRL 7.0 (GLOVE) IMPLANT
GLOVE BIOGEL PI IND STRL 7.5 (GLOVE) IMPLANT
GLOVE BIOGEL PI INDICATOR 6.5 (GLOVE) ×1
GLOVE BIOGEL PI INDICATOR 7.0 (GLOVE) ×1
GLOVE BIOGEL PI INDICATOR 7.5 (GLOVE) ×1
GLOVE SS BIOGEL STRL SZ 6.5 (GLOVE) IMPLANT
GLOVE SUPERSENSE BIOGEL SZ 6.5 (GLOVE) ×1
GOWN PREVENTION PLUS XXLARGE (GOWN DISPOSABLE) ×2 IMPLANT
GOWN STRL NON-REIN LRG LVL3 (GOWN DISPOSABLE) ×4 IMPLANT
KIT BASIN OR (CUSTOM PROCEDURE TRAY) ×2 IMPLANT
KIT MARKER MARGIN INK (KITS) ×1 IMPLANT
KIT ROOM TURNOVER OR (KITS) ×2 IMPLANT
NDL 18GX1X1/2 (RX/OR ONLY) (NEEDLE) ×1 IMPLANT
NDL HYPO 25GX1X1/2 BEV (NEEDLE) ×2 IMPLANT
NEEDLE 18GX1X1/2 (RX/OR ONLY) (NEEDLE) ×2 IMPLANT
NEEDLE HYPO 25GX1X1/2 BEV (NEEDLE) ×4 IMPLANT
NS IRRIG 1000ML POUR BTL (IV SOLUTION) ×3 IMPLANT
PACK SURGICAL SETUP 50X90 (CUSTOM PROCEDURE TRAY) ×2 IMPLANT
PACK UNIVERSAL I (CUSTOM PROCEDURE TRAY) ×1 IMPLANT
PAD ARMBOARD 7.5X6 YLW CONV (MISCELLANEOUS) ×2 IMPLANT
PENCIL BUTTON HOLSTER BLD 10FT (ELECTRODE) ×2 IMPLANT
SPONGE LAP 18X18 X RAY DECT (DISPOSABLE) ×2 IMPLANT
STAPLER VISISTAT 35W (STAPLE) ×2 IMPLANT
STOCKINETTE IMPERVIOUS 9X36 MD (GAUZE/BANDAGES/DRESSINGS) ×1 IMPLANT
SUT MNCRL AB 4-0 PS2 18 (SUTURE) ×4 IMPLANT
SUT SILK 2 0 SH (SUTURE) IMPLANT
SUT VIC AB 2-0 SH 27 (SUTURE) ×4
SUT VIC AB 2-0 SH 27XBRD (SUTURE) ×2 IMPLANT
SUT VIC AB 3-0 SH 27 (SUTURE) ×4
SUT VIC AB 3-0 SH 27XBRD (SUTURE) ×2 IMPLANT
SYR CONTROL 10ML LL (SYRINGE) ×5 IMPLANT
TOWEL OR 17X24 6PK STRL BLUE (TOWEL DISPOSABLE) ×2 IMPLANT
TOWEL OR 17X26 10 PK STRL BLUE (TOWEL DISPOSABLE) ×2 IMPLANT
TUBE CONNECTING 12X1/4 (SUCTIONS) ×2 IMPLANT
YANKAUER SUCT BULB TIP NO VENT (SUCTIONS) ×2 IMPLANT

## 2012-02-02 NOTE — Preoperative (Signed)
Beta Blockers   Reason not to administer Beta Blockers:Not Applicable 

## 2012-02-02 NOTE — H&P (View-Only) (Signed)
Chief Complaint:  New diagnosis of R breast cancer  HISTORY: Pt presents with screening detected R breast cancer.  An 5 mm area of microcalcifications generated a callback for diagnostic mammogram.  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  She has a daughter with breast cancer as well, and her mother had uterine cancer. She has not had to have prior breast biopsies, and has missed several mammograms.  She had menarche at age 71, is post menopausal.  She did not use HRT.  She has 3 children, first at age 14.  She did use OCPs for 11 years.  She currently has breast pain after her biopsy, but prior to that was asymptomatic.  She is a former bartender.  She has significant low back pain from her spondylosis.    Past Medical History  Diagnosis Date  . Rosacea   . High cholesterol   . Congestive heart failure   . Renal failure   . Low back pain   . Left arm pain   . Neck pain   . Thoracic back pain     Past Surgical History  Procedure Date  . Abdominal hysterectomy   . Cervical fusion   . Rotator cuff repair   . Replacement total knee     Current Outpatient Prescriptions  Medication Sig Dispense Refill  . diclofenac sodium (VOLTAREN) 1 % GEL Apply topically.      . doxycycline (VIBRAMYCIN) 50 MG capsule Take 50 mg by mouth every other day. FOR ROSACEA      . esomeprazole (NEXIUM) 40 MG capsule Take 40 mg by mouth daily.      . meloxicam (MOBIC) 7.5 MG tablet Take 7.5 mg by mouth daily.      . pravastatin (PRAVACHOL) 20 MG tablet Take 20 mg by mouth daily.      . raloxifene (EVISTA) 60 MG tablet Take 60 mg by mouth daily.      . traMADol (ULTRAM) 50 MG tablet Take 50 mg by mouth every 8 (eight) hours as needed. 1 - 2  EVERY 8 HOURS PRN         Allergies  Allergen Reactions  . Hydrocodone Itching  . Mobic Itching and Other (See Comments)    FACIAL FLUSHING  . Morphine And Related Itching  . Ultram (Tramadol Hcl) Itching     Family History  Problem Relation  Age of Onset  . Cancer Mother   . Cancer Maternal Uncle   . Cancer Daughter      History   Social History  . Marital Status: Divorced    Spouse Name: N/A    Number of Children: N/A  . Years of Education: N/A   Social History Main Topics  . Smoking status: Former Smoker -- 0.5 packs/day    Quit date: 01/22/2008  . Smokeless tobacco: Not on file  . Alcohol Use: Yes  . Drug Use:   . Sexually Active:    Other Topics Concern  . Not on file   Social History Narrative  . No narrative on file     REVIEW OF SYSTEMS - PERTINENT POSITIVES ONLY: 12 point review of systems negative other than HPI and PMH except for lumbar pain, ringing ears, urinary urgency, and rosacea.  EXAM: Wt Readings from Last 3 Encounters:  01/04/12 136 lb 3.2 oz (61.78 kg)   Temp Readings from Last 3 Encounters:  01/04/12 98.3 F (36.8 C) Oral   BP Readings from Last 3 Encounters:  01/04/12 132/84     Pulse Readings from Last 3 Encounters:  01/04/12 65    Gen:  No acute distress.  Well nourished and well groomed.   Neurological: Alert and oriented to person, place, and time. Coordination normal. Slightly stiff Head: Normocephalic and atraumatic.  Eyes: Conjunctivae are normal. Pupils are equal, round, and reactive to light. No scleral icterus.  Neck: Normal range of motion. Neck supple. No tracheal deviation or thyromegaly present.  Cardiovascular: Normal rate, regular rhythm, normal heart sounds and intact distal pulses.  Exam reveals no gallop and no friction rub.  No murmur heard. Respiratory: Effort normal.  No respiratory distress. No chest wall tenderness. Breath sounds normal.  No wheezes, rales or rhonchi.  Breasts:  No palpable mass in either breast. R breast very sore and bruised from biopsy.  No nipple retraction or skin dimpling.  No axillary adenopathy.   GI: Soft. Bowel sounds are normal. The abdomen is soft and nontender.  There is no rebound and no guarding.  Musculoskeletal: Normal  range of motion. Extremities are nontender.  Lymphadenopathy: No cervical, preauricular, postauricular or axillary adenopathy is present Skin: Skin is warm and dry. No rash noted. No diaphoresis. No erythema. No pallor. No clubbing, cyanosis, or edema.   Psychiatric: Normal mood and affect. Behavior is normal. Judgment and thought content normal.    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 No evidence  of multifocal, multicentric or contralateral neoplasm. No abnormal-  appearing lymph nodes.  Moderate to marked normal background parenchymal enhancement  slightly limits sensitivity.   Diagnostic mammogram IMPRESSION:  Clustered pleomorphic microcalcifications in the right posterior  lower outer quadrant. Stereotactic core needle biopsy is  suggested. This has been scheduled for 12/26/2011.  BI-RADS CATEGORY 4: Suspicious abnormality - biopsy should be  considered.   ASSESSMENT AND PLAN: Cancer of upper-outer quadrant of female breast cT1a, N0M0 Plan breast conservation therapy.  Pt good candidate for lumpectomy/sentinel lymph node.    The surgical procedure was described to the patient.  I discussed the incision type and location and whether we would need radiology involved on the day of surgery with a wire marker and/or sentinel node.      The risks and benefits of the procedure were described to the patient and he/she wishes to proceed.    We discussed the risks bleeding, infection, damage to other structures, need for further procedures/surgeries.  We discussed the risk of seroma.  The patient was advised if the area in the breast in cancer, we may need to go back to surgery for additional tissue to obtain negative margins or for a lymph node biopsy.  The patient was advised that these are the most common complications, but that others can occur as well.  They were advised against taking aspirin or other anti-inflammatory agents/blood thinners the week before surgery.        Gleen Ripberger L Mikaylee Arseneau MD Surgical Oncology, General and Endocrine Surgery [facility]    Visit Diagnoses: 1. Cancer of upper-outer quadrant of female breast     Primary Care Physician: MOREIRA,ROY, MD, MD    

## 2012-02-02 NOTE — Transfer of Care (Signed)
Immediate Anesthesia Transfer of Care Note  Patient: Sabrina Sullivan  Procedure(s) Performed: Procedure(s) (LRB): BREAST LUMPECTOMY WITH NEEDLE LOCALIZATION AND AXILLARY SENTINEL LYMPH NODE BX (Right)  Patient Location: PACU  Anesthesia Type: General  Level of Consciousness: alert  and oriented  Airway & Oxygen Therapy: Patient Spontanous Breathing and Patient connected to nasal cannula oxygen  Post-op Assessment: Report given to PACU RN and Post -op Vital signs reviewed and stable  Post vital signs: stable  Complications: No apparent anesthesia complications

## 2012-02-02 NOTE — Anesthesia Preprocedure Evaluation (Addendum)
Anesthesia Evaluation  Patient identified by MRN, date of birth, ID band Patient awake    Reviewed: Allergy & Precautions, H&P , NPO status , Patient's Chart, lab work & pertinent test results  History of Anesthesia Complications (+) AWARENESS UNDER ANESTHESIA  Airway Mallampati: I TM Distance: >3 FB Neck ROM: full    Dental  (+) Teeth Intact   Pulmonary former smoker   Pulmonary exam normal       Cardiovascular Exercise Tolerance: Good Rhythm:regular Rate:Normal     Neuro/Psych Anxiety negative neurological ROS     GI/Hepatic Neg liver ROS, GERD-  Medicated and Controlled,  Endo/Other  negative endocrine ROS  Renal/GU negative Renal ROS  negative genitourinary   Musculoskeletal negative musculoskeletal ROS (+)   Abdominal Normal abdominal exam  (+)   Peds  Hematology negative hematology ROS (+)   Anesthesia Other Findings   Reproductive/Obstetrics negative OB ROS                         Anesthesia Physical Anesthesia Plan  ASA: II  Anesthesia Plan: General LMA   Post-op Pain Management:    Induction: Intravenous  Airway Management Planned: LMA  Additional Equipment:   Intra-op Plan:   Post-operative Plan:   Informed Consent: I have reviewed the patients History and Physical, chart, labs and discussed the procedure including the risks, benefits and alternatives for the proposed anesthesia with the patient or authorized representative who has indicated his/her understanding and acceptance.     Plan Discussed with: Anesthesiologist, CRNA and Surgeon  Anesthesia Plan Comments:        Anesthesia Quick Evaluation

## 2012-02-02 NOTE — Progress Notes (Signed)
Notified Optometrist Med. Of Pt's arrival to short stay.

## 2012-02-02 NOTE — Op Note (Addendum)
Breast Lumpectomy with Sentinal Node Biopsy Procedure Note  Indications: This patient presents with history of right breast cancer with clinically negative axillary lymph node exam.  Pre-operative Diagnosis: right breast cancer  Post-operative Diagnosis: right breast cancer  Surgeon: Almond Lint   Anesthesia: General endotracheal anesthesia and Local anesthesia 1% buffered lidocaine, 0.25.% bupivacaine, with epinephrine  ASA Class: 2, 3  Procedure Details  The patient was seen in the Holding Room. The risks, benefits, complications, treatment options, and expected outcomes were discussed with the patient. The possibilities of reaction to medication, pulmonary aspiration, bleeding, infection, the need for additional procedures, failure to diagnose a condition, and creating a complication requiring transfusion or operation were discussed with the patient. The patient concurred with the proposed plan, giving informed consent.  The site of surgery properly noted/marked. The patient was taken to Operating Room # 16, identified as Sabrina Sullivan and the procedure verified as Breast Lumpectomy and Sentinal Node Biopsy. A Time Out was held and the above information confirmed.  After induction of anesthesia, the right arm, breast, and chest were prepped and draped in standard fashion.     The lumpectomy was performed by creating an oblique incision over the lower outer quadrant of the breastaround the previously placed localization guidewire.  Dissection was carried down to the pectoral fascia.  Orientation sutures were placed and the skin and pectoralis margins were inked.  Specimen radiography confirmed inclusion of the mammographic lesion.  Hemostasis was achieved with cautery.  Additional tissue was taken along the anterior margin and submitted separately to pathology after providing orientation for the pathology.  The wound was irrigated and closed with 3-0 Vicryl deep dermal and 4-0 Monocryl  subcuticular closure in layers.  Using a hand-held gamma probe, axillary sentinel nodes were identified transcutaneously.  An oblique incision was created below the axillary hairline.  Dissection was carried through the clavipectoral fascia.  2 level 2 axillary sentinel nodes were removed. The axillary incision was closed with a 4-0 Vicryl subcuticular closure in layers.  Sterile dressings were applied. At the end of the operation, all sponge, instrument, and needle counts were correct.  Findings: grossly clear surgical margins, posterior margin is chest wall.  See below for SLN info.  Specimen mammogram close anteriorly, additional anterior margin taken.    Estimated Blood Loss:  Minimal         Specimens: R breast lumpectomy and 2 axillary sentinel nodes.  #1 hot, CPS 219, #2 hot and blue, CPS 985, background 11.  Additional anterior margin.         Complications:  None; patient tolerated the procedure well.         Disposition: PACU - hemodynamically stable.         Condition: stable

## 2012-02-02 NOTE — OR Nursing (Signed)
Tolerating po flds well

## 2012-02-02 NOTE — Progress Notes (Signed)
Report given to mark rn caregiver

## 2012-02-02 NOTE — Interval H&P Note (Signed)
History and Physical Interval Note:  02/02/2012 9:50 AM  Sabrina Sullivan  has presented today for surgery, with the diagnosis of right breast cancer  The various methods of treatment have been discussed with the patient and family. After consideration of risks, benefits and other options for treatment, the patient has consented to  Procedure(s) (LRB): BREAST LUMPECTOMY WITH NEEDLE LOCALIZATION AND AXILLARY SENTINEL LYMPH NODE BX (Right) as a surgical intervention .  The patients' history has been reviewed, patient examined, no change in status, stable for surgery.  I have reviewed the patients' chart and labs.  Questions were answered to the patient's satisfaction.     Elion Hocker

## 2012-02-02 NOTE — Progress Notes (Signed)
Pt  Had some relief from  Post op nausea.... And was discharged to home with her daughter .

## 2012-02-02 NOTE — Anesthesia Postprocedure Evaluation (Signed)
  Anesthesia Post-op Note  Patient: Sabrina Sullivan  Procedure(s) Performed: Procedure(s) (LRB): BREAST LUMPECTOMY WITH NEEDLE LOCALIZATION AND AXILLARY SENTINEL LYMPH NODE BX (Right)  Patient Location: PACU  Anesthesia Type: General  Level of Consciousness: awake, alert  and oriented  Airway and Oxygen Therapy: Patient Spontanous Breathing  Post-op Pain: mild  Post-op Assessment: Post-op Vital signs reviewed  Post-op Vital Signs: stable  Complications: No apparent anesthesia complications

## 2012-02-02 NOTE — Discharge Instructions (Addendum)
[facility] °Office Phone Number [phone] ° °BREAST BIOPSY/ PARTIAL MASTECTOMY: POST OP INSTRUCTIONS ° °Always review your discharge instruction sheet given to you by the facility where your surgery was performed. ° °IF YOU HAVE DISABILITY OR FAMILY LEAVE FORMS, YOU MUST BRING THEM TO THE OFFICE FOR PROCESSING.  DO NOT GIVE THEM TO YOUR DOCTOR. ° °1. A prescription for pain medication may be given to you upon discharge.  Take your pain medication as prescribed, if needed.  If narcotic pain medicine is not needed, then you may take acetaminophen (Tylenol) or ibuprofen (Advil) as needed. °2. Take your usually prescribed medications unless otherwise directed °3. If you need a refill on your pain medication, please contact your pharmacy.  They will contact our office to request authorization.  Prescriptions will not be filled after 5pm or on week-ends. °4. You should eat very light the first 24 hours after surgery, such as soup, crackers, pudding, etc.  Resume your normal diet the day after surgery. °5. Most patients will experience some swelling and bruising in the breast.  Ice packs and a good support bra will help.  Swelling and bruising can take several days to resolve.  °6. It is common to experience some constipation if taking pain medication after surgery.  Increasing fluid intake and taking a stool softener will usually help or prevent this problem from occurring.  A mild laxative (Milk of Magnesia or Miralax) should be taken according to package directions if there are no bowel movements after 48 hours. °7. Unless discharge instructions indicate otherwise, you may remove your bandages 48 hours after surgery, and you may shower at that time.  You may have steri-strips (small skin tapes) in place directly over the incision.  These strips should be left on the skin for 7-10 days.   Any sutures or staples will be removed at the office during your follow-up visit. °8. ACTIVITIES:  You may resume  regular daily activities (gradually increasing) beginning the next day.  Wearing a good support bra or sports bra (or the breast binder) minimizes pain and swelling.  You may have sexual intercourse when it is comfortable. °a. You may drive when you no longer are taking prescription pain medication, you can comfortably wear a seatbelt, and you can safely maneuver your car and apply brakes. °b. RETURN TO WORK:  __________1 week_______________ °9. You should see your doctor in the office for a follow-up appointment approximately two weeks after your surgery.  Your doctor’s nurse will typically make your follow-up appointment when she calls you with your pathology report.  Expect your pathology report 2-3 business days after your surgery.  You may call to check if you do not hear from us after three days. ° ° °WHEN TO CALL YOUR DOCTOR: °1. Fever over 101.0 °2. Nausea and/or vomiting. °3. Extreme swelling or bruising. °4. Continued bleeding from incision. °5. Increased pain, redness, or drainage from the incision. ° °The clinic staff is available to answer your questions during regular business hours.  Please don’t hesitate to call and ask to speak to one of the nurses for clinical concerns.  If you have a medical emergency, go to the nearest emergency room or call 911.  A surgeon from [facility] is always on call at the hospital. ° °For further questions, please visit [email or web address]  ° °

## 2012-02-03 ENCOUNTER — Telehealth (INDEPENDENT_AMBULATORY_CARE_PROVIDER_SITE_OTHER): Payer: Self-pay

## 2012-02-03 NOTE — Telephone Encounter (Signed)
Called pt and gave her pathology results.  She told me she had fallen during the night when she got up to go to the bathroom.  She is okay now, just sore.  Pt made aware of her appt on 02/08/12 with Dr. Donell Beers.

## 2012-02-08 ENCOUNTER — Ambulatory Visit: Payer: PRIVATE HEALTH INSURANCE | Admitting: Physical Medicine and Rehabilitation

## 2012-02-08 ENCOUNTER — Ambulatory Visit (INDEPENDENT_AMBULATORY_CARE_PROVIDER_SITE_OTHER): Payer: PRIVATE HEALTH INSURANCE | Admitting: General Surgery

## 2012-02-08 ENCOUNTER — Encounter (INDEPENDENT_AMBULATORY_CARE_PROVIDER_SITE_OTHER): Payer: Self-pay | Admitting: General Surgery

## 2012-02-08 VITALS — BP 114/72 | HR 68 | Temp 97.2°F | Resp 18 | Ht 64.0 in | Wt 135.6 lb

## 2012-02-08 DIAGNOSIS — C50419 Malignant neoplasm of upper-outer quadrant of unspecified female breast: Secondary | ICD-10-CM

## 2012-02-08 NOTE — Patient Instructions (Signed)
Take 100 mg twice a day of doxycycline (vibramycin)  That is 2 of your pills twice daily.  Ok to use benadryl cream on breast as long as not on the incision.  Follow up in 2-3 weeks.

## 2012-02-08 NOTE — Progress Notes (Signed)
HISTORY: Having some left sided breast pain post op.  Complains of itching.  Denies fever/chills.    EXAM: General:  Alert and oriented.  Looks sore Incision:  No seroma.  Some warmth and edema of skin.     PATHOLOGY: 1. Breast, lumpectomy, right - INVASIVE DUCTAL CARCINOMA, NCH GRADE I, 0.7 CM. - INTERMEDIATE GRADE DUCTAL CARCINOMA IN SITU. - NO EVIDENCE OF ANGIOLYMPHATIC INVASION. - ALL THE FINAL RESECTION MARGINS ARE CLEAR. - PLEASE SEE ONCOLOGY TEMPLATE FOR DETAILS. 2. Breast, excision, right - new anterior margin - BENIGN BREAST TISSUE WITH FIBROCYSTIC CHANGE AND ASSOCIATED CALCIFICATION. - NO ATYPIA OR MALIGNANCY. - FINAL ANTERIOR MARGIN NEGATIVE FOR ATYPIA OR MALIGNANCY. 3. Lymph node, sentinel, biopsy, right axillary #1 - ONE LYMPH NODE, NEGATIVE FOR METASTATIC CARCINOMA (0/1). 4. Lymph node, sentinel, biopsy, right axillary #2 - ONE LYMPH NODE, NEGATIVE FOR METASTATIC CARCINOMA(0/1).   ASSESSMENT AND PLAN:   Cancer of upper-outer quadrant of female breast Doing well overall after BCT.  Benadryl to breasts in itchy areas other than incision.  Doxycycline for warmth of R breast in surgical site.   Follow up in 2-3 weeks.         Maudry Diego, MD Surgical Oncology, General & Endocrine Surgery [facility]  Ralene Ok, MD, MD Ralene Ok, MD

## 2012-02-08 NOTE — Assessment & Plan Note (Signed)
Doing well overall after BCT.  Benadryl to breasts in itchy areas other than incision.  Doxycycline for warmth of R breast in surgical site.   Follow up in 2-3 weeks.

## 2012-02-10 ENCOUNTER — Encounter
Payer: PRIVATE HEALTH INSURANCE | Attending: Physical Medicine and Rehabilitation | Admitting: Physical Medicine and Rehabilitation

## 2012-02-10 ENCOUNTER — Encounter: Payer: Self-pay | Admitting: Physical Medicine and Rehabilitation

## 2012-02-10 VITALS — BP 119/60 | HR 73 | Resp 18 | Ht 64.0 in | Wt 137.0 lb

## 2012-02-10 DIAGNOSIS — M47817 Spondylosis without myelopathy or radiculopathy, lumbosacral region: Secondary | ICD-10-CM

## 2012-02-10 DIAGNOSIS — M7918 Myalgia, other site: Secondary | ICD-10-CM | POA: Insufficient documentation

## 2012-02-10 DIAGNOSIS — M25559 Pain in unspecified hip: Secondary | ICD-10-CM | POA: Insufficient documentation

## 2012-02-10 DIAGNOSIS — M545 Low back pain, unspecified: Secondary | ICD-10-CM | POA: Insufficient documentation

## 2012-02-10 DIAGNOSIS — M7060 Trochanteric bursitis, unspecified hip: Secondary | ICD-10-CM | POA: Insufficient documentation

## 2012-02-10 DIAGNOSIS — IMO0001 Reserved for inherently not codable concepts without codable children: Secondary | ICD-10-CM

## 2012-02-10 DIAGNOSIS — M76899 Other specified enthesopathies of unspecified lower limb, excluding foot: Secondary | ICD-10-CM

## 2012-02-10 DIAGNOSIS — M47816 Spondylosis without myelopathy or radiculopathy, lumbar region: Secondary | ICD-10-CM | POA: Insufficient documentation

## 2012-02-10 DIAGNOSIS — M79609 Pain in unspecified limb: Secondary | ICD-10-CM | POA: Insufficient documentation

## 2012-02-10 MED ORDER — MELOXICAM 7.5 MG PO TABS: 7.5000 mg | ORAL_TABLET | Freq: Every day | ORAL | Status: DC | PRN

## 2012-02-10 MED ORDER — TRAMADOL HCL 50 MG PO TABS: 50.0000 mg | ORAL_TABLET | ORAL | Status: DC

## 2012-02-10 NOTE — Progress Notes (Signed)
Subjective:    Patient ID: Sabrina Sullivan, female    DOB: [birth year], 71 y.o.   MRN: [id number]  HPI  She is followed [facility] at [facility] for  chronic pain complaints, predominantly related to her low back and hip  region. She carries diagnosis of lumbar facet arthropathy, mild fascia  gluteal muscle dysfunction. She is status post a right knee replacement  and she also has had problems intermittently with trochanteric bursitis.  Breast Cancer surgery last week and was on dilaudid. She thinks she fell the night that she went home but does really recall.  Daughter was staying with her and may have been asleep.  She states she is fine except for previous pain complaints in low back and buttock.  Radiation treatments are planned.  Patient is very stressed from recent events.    Review of Systems  Constitutional: Negative.   HENT: Negative.   Eyes: Negative.   Respiratory: Negative.   Cardiovascular: Negative.   Gastrointestinal: Negative.   Genitourinary: Negative.   Musculoskeletal: Positive for myalgias and arthralgias.  Skin:       Breast incision itching and swelling  Neurological: Negative.   Hematological: Negative.   Psychiatric/Behavioral:       Tearful talking about her breast cancer treatment   Pain Inventory Average Pain 9 Pain Right Now 9 My pain is constant, burning, dull, tingling and aching  In the last 24 hours, has pain interfered with the following? General activity 6 Relation with others 6 Enjoyment of life 10 What TIME of day is your pain at its worst? evening and at night Sleep (in general) Fair  Pain is worse with: sitting and some activites Pain improves with: rest, heat/ice and medication Relief from Meds: 6  Mobility walk without assistance how many minutes can you walk? 20 ability to climb steps?  yes do you drive?  yes  Function disabled: date disabled 2007  Neuro/Psych No problems in this area  Prior  Studies x-rays CT/MRI mammogram  Physicians involved in your care Any changes since last visit?  yes Dr Donell Beers surgeon, Dr Roselind Messier, radiation oncologist     Objective:   Physical Exam  The patient is alert orientated x3 cooperative pleasant follows commands without difficulty answers questions appropriately  Cranial nerves are grossly intact  Coordination is intact  There are no new sensory deficits lower extremities  Reflexes are 2+ patellar and Achilles tendons symmetric  Motor strength manual muscle testing 5/5 hip flexor and extensor dorsiflex or plantar flexor.  Transitioned easily from sitting to standing gait is stable not antalgic  Tandem gait and Romberg test are performed adequately  Internal and external rotation at the hips does not aggravate pain in growing or posterior hip.  Forward flexion from the lumbar spine is not aggravate pain in good range of motion is noted. Lumbar extension is not aggravating significantly today.  Palpation in the lumbar paraspinal musculature and into gluteal musculature does aggravate. No tenderness noted over either trochanter today.      Assessment & Plan:   1. Lumbago with history of facet arthropathy.  2. History of mild degenerative joint disease of hip.  3. Myofascial gluteal muscle dysfunction/ischial bursitis.  4. Left trochanteric bursitis, overall improved status post injection.  5. Status post cervical surgery, Dr. Newell Coral. The patient has  recently followed up with Dr. Yevette Edwards who saw her. A CT scan of  the neck was done in August and Dr. Yevette Edwards recommended continued  use of tramadol and Mobic and consideration of left-sided C5  selective nerve root block should she have worsening pain in this  upper extremity/shoulder area.   Breast surgery has not contributed significantly to pain complaints. The patient was prescribed Percocet postoperatively but she tells me she is not taking. The side effects profile on  Percocet causes nausea and constipation.  She requests refill on tramadol and meloxicam.  We also discussed considering repeating her medial branch blocks at L4-5 and L5-S1. However we are going to defer this option currently.  I've recommended that she start walking on a daily basis again.   I will refill her pain medications which include tramadol as well as meloxicam. She understands the risks and benefits of both these medications and is trying to minimize use of the meloxicam as much as she can. Using meloxicam about 3-4 times a week. I will see her back in 3 months

## 2012-02-10 NOTE — Progress Notes (Signed)
Addended by: Ashok Cordia on: 02/10/2012 11:49 AM   Modules accepted: Orders

## 2012-02-10 NOTE — Patient Instructions (Signed)
Follow up in clinic in 3 months.  I'm encouraging you to walk on a daily basis

## 2012-02-14 ENCOUNTER — Encounter: Payer: Self-pay | Admitting: Radiation Oncology

## 2012-02-15 ENCOUNTER — Ambulatory Visit
Admission: RE | Admit: 2012-02-15 | Discharge: 2012-02-15 | Disposition: A | Discharge status: Home / Self Care | Payer: PRIVATE HEALTH INSURANCE | Source: Ambulatory Visit | Attending: Radiation Oncology | Admitting: Radiation Oncology

## 2012-02-15 ENCOUNTER — Encounter: Payer: Self-pay | Admitting: Radiation Oncology

## 2012-02-15 VITALS — BP 138/96 | HR 103 | Temp 97.3°F | Wt 138.3 lb

## 2012-02-15 DIAGNOSIS — C50919 Malignant neoplasm of unspecified site of unspecified female breast: Secondary | ICD-10-CM | POA: Insufficient documentation

## 2012-02-15 DIAGNOSIS — C50419 Malignant neoplasm of upper-outer quadrant of unspecified female breast: Secondary | ICD-10-CM

## 2012-02-15 DIAGNOSIS — Z510 Encounter for antineoplastic radiation therapy: Secondary | ICD-10-CM | POA: Insufficient documentation

## 2012-02-15 DIAGNOSIS — Z170 Estrogen receptor positive status [ER+]: Secondary | ICD-10-CM | POA: Insufficient documentation

## 2012-02-15 DIAGNOSIS — Z79899 Other long term (current) drug therapy: Secondary | ICD-10-CM | POA: Insufficient documentation

## 2012-02-15 NOTE — Progress Notes (Signed)
Please see the Nurse Progress Note in the MD Initial Consult Encounter for this patient. 

## 2012-02-15 NOTE — Progress Notes (Signed)
Encounter addended by: Tessa Lerner, RN on: 02/15/2012  4:12 PM<BR>     Documentation filed: Charges VN

## 2012-02-15 NOTE — Progress Notes (Signed)
CC:   Sabrina Lint, MD Sabrina Sullivan, M.D. Sabrina Sullivan, M.D. 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  NARRATIVE:  Mrs. Sabrina Sullivan returns for further evaluation.  She was initially seen by myself as part of the Multidisciplinary Clinic on January 04, 2012.  The patient did proceed to have definitive surgery by Dr. Donell Beers on February 02, 2012.  The patient underwent partial mastectomy and sentinel node procedure.  Upon pathologic review, the patient was found have a low-grade invasive ductal carcinoma measuring 0.7 cm.  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  The surgical margins were clear with the closest margin being anterior at 0.8 cm.  The patient had 2 benign sentinel lymph nodes recovered at the time of her procedure.  The patient's tumor was estrogen receptor positive at 65% and progesterone receptor was negative.  There is no HER- 2/neu amplification.  Proliferation marker was 12%.  Postoperatively, the patient has been doing reasonably well.  She continues to have some soreness in the breast area.  The patient is scheduled to see Dr. Welton Flakes in the near future for further evaluation.  PHYSICAL EXAMINATION:  General: This is a very pleasant, healthy- appearing 71 year old female in no acute distress.  Vital signs: Temperature 97.3, pulse 103.  Blood pressure is 138/96.  Weight is 138 pounds.  Examination of the neck and supraclavicular region reveals no evidence of adenopathy.  The axillary areas are free of adenopathy. Examination of the lungs reveals them to be clear.  The heart has a regular rhythm and rate.  Examination of the right breast area reveals a lumpectomy scar, which is located in approximately the 9 o'clock position of the right breast.  There are some associated Steri-Strips in place.  There is no obvious signs of infection in the breast at this time.   There is some mild swelling noted.  The patient also has a separate scar in the axillary area from her sentinel node procedure. This is also healing well with Steri-Strips in place.  The patient has some limitation of her right arm mobility in light of her recent surgery.  IMPRESSION AND PLAN:  Stage I low-grade invasive ductal carcinoma of the right breast.  I discussed the pathologic findings with Sabrina Sullivan.  We discussed options to consider including radiation therapy, hormonal therapy and combination of both.  Given the patient's excellent prognosis, I feel the patient would likely do well with either treatment option.  Given the patient's age and breast contour, I do feel the patient would be a candidate for accelerated hypofractionated treatment over approximately 4 weeks.  At this point, Sabrina Sullivan appears to be leaning toward the radiation therapy as her treatment approach, but, as above, will meet with Dr. Park Breed for further discussion of hormonal therapy.  I have asked the patient to call me once she has met with Dr. Welton Flakes as to her decision concerning radiation therapy.   ______________________________ Sabrina Sullivan, Ph.D., M.D. JDK/MEDQ  D:  02/15/2012  T:  02/15/2012  Job:  [id number]

## 2012-02-16 ENCOUNTER — Telehealth: Payer: Self-pay | Admitting: *Deleted

## 2012-02-16 NOTE — Telephone Encounter (Signed)
Patient should still come in for a follow up appointment after she completes her radiation therapy

## 2012-02-16 NOTE — Telephone Encounter (Signed)
Pt called states " I saw Dr. Roselind Messier and Peggye Form chosen to do the radiation for 20days instead of the oral chemo Do I still need to see Dr.Khan on 03/08/12. I don't want to take the oral chemo"    Will review with MD and notify if she is to keep appt 03/08/12 or cancel.  Pt last seen 12/1311 ASSESSMENT  71 year old with:  1. Stage Ia IDC ER+PR- HER2Neu- ki-67 12%, measuring 5mm  2. Seen in [facility] for treatment options  Multidisciplinary conference discussion: recommend lumpectomy/SNL followed by RT and then anti-estrogen   PLAN:  1. Lumpectomy with SNL  2. Radiation therapy post lumpectomy  3. Adjuvant anti-estrogen therapy in ER+ stage IA IDC per NCCN guidelines

## 2012-02-17 ENCOUNTER — Encounter: Payer: Self-pay | Admitting: *Deleted

## 2012-02-17 ENCOUNTER — Telehealth: Payer: Self-pay | Admitting: *Deleted

## 2012-02-17 NOTE — Telephone Encounter (Signed)
Called notified pt to come in for a follow up appt after she completes her radiation. No appt at this time for radiation start date. Pt advised she will start radiation 5 wks after surgery approx end of April or beginning of May. Pt states " I will call and make a f/u appt after I know what date I start radiation."  Reconfirmed and verbalized " pt will call back to make f/u appt with Dr. Welton Flakes after she has start date for radiation." Pt confirmed this is correct.   Call Documentation     Victorino December., MD 02/16/2012 5:16 PM Signed  Patient should still come in for a follow up appointment after she completes her radiation therapy Jolinda Croak, RN 02/16/2012 4:06 PM Signed  Pt called states " I saw Dr. Roselind Messier and Peggye Form chosen to do the radiation for 20days instead of the oral chemo  Do I still need to see Dr.Khan on 03/08/12. I don't want to take the oral chemo"  Will review with MD and notify if she is to keep appt 03/08/12 or cancel.  Pt last seen 12/1311  ASSESSMENT  71 year old with:  1. Stage Ia IDC ER+PR- HER2Neu- ki-67 12%, measuring 5mm  2. Seen in [facility] for treatment options  Multidisciplinary conference discussion: recommend lumpectomy/SNL followed by RT and then anti-estrogen  PLAN:  1. Lumpectomy with SNL  2. Radiation therapy post lumpectomy  3. Adjuvant anti-estrogen therapy in ER+ stage IA IDC per NCCN guidelines

## 2012-02-29 ENCOUNTER — Telehealth (INDEPENDENT_AMBULATORY_CARE_PROVIDER_SITE_OTHER): Payer: Self-pay | Admitting: General Surgery

## 2012-02-29 NOTE — Telephone Encounter (Signed)
Pt calling to report her breast has become red and "heavy" the last two days.  She is on antibiotics at this time.  No pain or fever.  Tried to find appt Friday for Dr. Donell Beers to check the breast, but would need to be work-in.  Please let pt know when she can come in.  (Her postop appt is 03/08/12.)  Home #: [phone], or Cell #: [phone]

## 2012-03-01 NOTE — Telephone Encounter (Signed)
10 am Friday appt, show up early  Tx FB

## 2012-03-02 ENCOUNTER — Telehealth (INDEPENDENT_AMBULATORY_CARE_PROVIDER_SITE_OTHER): Payer: Self-pay

## 2012-03-02 ENCOUNTER — Encounter (INDEPENDENT_AMBULATORY_CARE_PROVIDER_SITE_OTHER): Payer: Self-pay | Admitting: General Surgery

## 2012-03-02 ENCOUNTER — Ambulatory Visit (INDEPENDENT_AMBULATORY_CARE_PROVIDER_SITE_OTHER): Payer: PRIVATE HEALTH INSURANCE | Admitting: General Surgery

## 2012-03-02 VITALS — BP 128/84 | HR 68 | Temp 97.3°F | Resp 18 | Ht 64.0 in | Wt 138.0 lb

## 2012-03-02 DIAGNOSIS — C50419 Malignant neoplasm of upper-outer quadrant of unspecified female breast: Secondary | ICD-10-CM

## 2012-03-02 MED ORDER — AMOXICILLIN-POT CLAVULANATE 875-125 MG PO TABS: 1.0000 | ORAL_TABLET | Freq: Two times a day (BID) | ORAL | Status: AC

## 2012-03-02 NOTE — Patient Instructions (Signed)
Take 10 days antibiotics.  Follow up with me as needed if not improving.  Wait to start radiation until redness gone.

## 2012-03-02 NOTE — Telephone Encounter (Signed)
Message copied by Joanette Gula on Fri Mar 02, 2012 12:46 PM ------      Message from: Otis Peak D      Created: Fri Mar 02, 2012 12:41 PM       Hello,            Spoke with patient, she will see Dr. Roselind Messier 03/07/12 for a FU appt.            Thanks,            Otis Peak      Radiation Oncology Scheduler      [phone]            ----- Message -----         From: Joanette Gula, LPN         Sent: 03/02/2012  10:06 AM           To: Macario Golds            Pt saw Dr Donell Beers today for f/u.  Just and FYI. Pt was seen by Dr Roselind Messier for rad onc eval. Pt is now 4 weeks po lumpectomy and needs appt to come back for simulation. Thanks

## 2012-03-02 NOTE — Progress Notes (Signed)
HISTORY: Pt now 1 month s/p lumpectomy, sentinel node for right breast cancer.  She is feeling heaviness and soreness in her R breast.  She is also having redness and swelling.  She tried going up on her doxycycline for a few days but this did not help.       EXAM: General:  Looks uncomfortable.  Incision:  R breast with skin edema.  No significant seroma, no tightness of the breast.     ASSESSMENT AND PLAN:   Cancer of upper-outer quadrant of female breast Augmentin x 10 days for cellulitis.   No seroma to aspirate Follow up with me in 3 months or earlier if cellulitis not resolved.       Maudry Diego, MD Surgical Oncology, General & Endocrine Surgery [facility]  Ralene Ok, MD, MD Ralene Ok, MD

## 2012-03-02 NOTE — Assessment & Plan Note (Signed)
Augmentin x 10 days for cellulitis.   No seroma to aspirate Follow up with me in 3 months or earlier if cellulitis not resolved.

## 2012-03-07 ENCOUNTER — Telehealth: Payer: Self-pay | Admitting: *Deleted

## 2012-03-07 ENCOUNTER — Ambulatory Visit: Payer: PRIVATE HEALTH INSURANCE | Admitting: Radiation Oncology

## 2012-03-07 ENCOUNTER — Ambulatory Visit: Payer: PRIVATE HEALTH INSURANCE

## 2012-03-07 NOTE — Telephone Encounter (Signed)
patient called and requested to cancel the appointment 03-08-2012

## 2012-03-08 ENCOUNTER — Ambulatory Visit: Payer: PRIVATE HEALTH INSURANCE | Admitting: Oncology

## 2012-03-09 ENCOUNTER — Encounter (INDEPENDENT_AMBULATORY_CARE_PROVIDER_SITE_OTHER): Payer: PRIVATE HEALTH INSURANCE | Admitting: General Surgery

## 2012-03-14 ENCOUNTER — Ambulatory Visit
Admission: RE | Admit: 2012-03-14 | Discharge: 2012-03-14 | Disposition: A | Discharge status: Home / Self Care | Payer: PRIVATE HEALTH INSURANCE | Source: Ambulatory Visit | Attending: Radiation Oncology | Admitting: Radiation Oncology

## 2012-03-14 ENCOUNTER — Encounter: Payer: Self-pay | Admitting: Radiation Oncology

## 2012-03-14 ENCOUNTER — Ambulatory Visit: Payer: PRIVATE HEALTH INSURANCE

## 2012-03-14 VITALS — BP 109/77 | HR 70 | Temp 97.5°F | Wt 139.2 lb

## 2012-03-14 DIAGNOSIS — C50419 Malignant neoplasm of upper-outer quadrant of unspecified female breast: Secondary | ICD-10-CM

## 2012-03-14 NOTE — Progress Notes (Signed)
Here for follow up new consultation right breast cancer.Has completed 10 regime of amoxicillin for right breast cellulitis.For ct simulation and radiation planning.

## 2012-03-14 NOTE — Progress Notes (Signed)
Radiation Oncology         [phone] ________________________________  Name: Sabrina MARTINDELCAMPO MRN: [id number]  Date: 03/14/2012  DOB: Jan 23, 1941  Follow-Up Visit Note  CC: Ralene Ok, MD, MD  Almond Lint, MD  Diagnosis:   Stage I right breast cancer    Narrative:  The patient returns today for follow-up.     She has been on a course of antibiotics for right breast cellulitis. This was initially treated with doxycycline. This did not seem to clear the infection and she  was subsequently placed on Augmentin for a 10 day course. Patient has recently completed this course of therapy. In addition attempts at aspiration of the right breast were unsuccessful. Patient has minimal discomfort in the right breast. She has more discomfort in the right axillary area where her sentinel node procedure was performed. The patient denies any chills or fever.  She did speak with Dr. Welton Flakes and the patient has elected not to proceed with adjuvant hormonal therapy.             ALLERGIES:  is allergic to hydrocodone and morphine and related.  Meds: Current Outpatient Prescriptions  Medication Sig Dispense Refill  . ALPRAZolam (XANAX) 0.5 MG tablet Take 0.5 mg by mouth 2 (two) times daily.      Marland Kitchen amoxicillin-clavulanate (AUGMENTIN) 875-125 MG per tablet Take 1 tablet by mouth 2 (two) times daily.  28 tablet  0  . calcium citrate (CALCITRATE - DOSED IN MG ELEMENTAL CALCIUM) 950 MG tablet Take 1 tablet by mouth daily.      . cholecalciferol (VITAMIN D) 1000 UNITS tablet Take 1,000 Units by mouth daily.      . diclofenac sodium (VOLTAREN) 1 % GEL Apply 1 application topically daily as needed. For inflammation      . doxycycline (VIBRAMYCIN) 50 MG capsule Take 50 mg by mouth every other day. FOR ROSACEA      . esomeprazole (NEXIUM) 40 MG capsule Take 40 mg by mouth daily.      . fish oil-omega-3 fatty acids 1000 MG capsule Take 1 g by mouth 2 (two) times daily.       Marland Kitchen MAGNESIUM PO Take 1,000 mg by mouth daily.       . meloxicam (MOBIC) 7.5 MG tablet Take 1 tablet (7.5 mg total) by mouth daily as needed for pain. For pain  30 tablet  2  . pravastatin (PRAVACHOL) 20 MG tablet Take 20 mg by mouth daily.      . Probiotic Product (PROBIOTIC FORMULA PO) Take 1 each by mouth daily.      . raloxifene (EVISTA) 60 MG tablet Take 60 mg by mouth daily.      . traMADol (ULTRAM) 50 MG tablet Take 1-2 tablets (50-100 mg total) by mouth 1 day or 1 dose. For pain  60 tablet  2  . vitamin B-12 (CYANOCOBALAMIN) 1000 MCG tablet Take 1,000 mcg by mouth daily.        Physical Findings: The patient is in no acute distress. Patient is alert and oriented.  weight is 139 lb 3.2 oz (63.141 kg). Her temperature is 97.5 F (36.4 C). Her blood pressure is 109/77 and her pulse is 70. Marland Kitchen  No palpable supraclavicular or axillary adenopathy. The lungs are clear. The heart has a regular rhythm and rate.  Right breast shows minimal erythema in the medial aspect of the breast. There is no obvious signs of infection at this time. Patient has a patient does have some  induration at the lumpectomy scar as well as the sentinel node site.  Lab Findings: Lab Results  Component Value Date   WBC 5.9 01/27/2012   HGB 14.2 01/27/2012   HCT 42.9 01/27/2012   MCV 94.7 01/27/2012   PLT 133* 01/27/2012    @LASTCHEM @  Radiographic Findings: No results found.  Impression:  The patient is now ready to proceed with planning her  radiation treatment.  Plan: She will undergo the CT simulation this morning and proceed with treatments early next week. I do feel the patient can be treated with a hypofractionated accelerated course of radiation treatments.  _____________________________________    Billie Lade, M.D.

## 2012-03-14 NOTE — Progress Notes (Signed)
Encounter addended by: Tessa Lerner, RN on: 03/14/2012  4:09 PM<BR>     Documentation filed: Charges VN

## 2012-03-14 NOTE — Progress Notes (Signed)
Please see the Nurse Progress Note in the MD Initial Consult Encounter for this patient. 

## 2012-03-14 NOTE — Progress Notes (Signed)
Met with patient to discuss RO billing.  Patient has concerns about transportation.  Gave her epp and [facility] app.  WIll return epp at next visit 03/21/12. 03/21/12 patient ret'd app and was processed - approved for 100% indigent

## 2012-03-15 NOTE — Progress Notes (Signed)
  Radiation Oncology         [phone] ________________________________  Name: TAIZ BICKLE MRN: [id number]  Date: 03/14/2012  DOB: 06-19-1941  SIMULATION AND TREATMENT PLANNING NOTE  DIAGNOSIS:  Right breast cancer  NARRATIVE:  The patient was brought to the CT Simulation planning suite.  Identity was confirmed.  All relevant records and images related to the planned course of therapy were reviewed.  The patient freely provided informed written consent to proceed with treatment after reviewing the details related to the planned course of therapy. The consent form was witnessed and verified by the simulation staff.  Then, the patient was set-up in a stable reproducible  supine position for radiation therapy.  CT images were obtained.  Surface markings were placed.  The CT images were loaded into the planning software.  Then the target and avoidance structures were contoured.  Treatment planning then occurred.  The radiation prescription was entered and confirmed.  A total of 1 (custom neck mold ) complex treatment devices were fabricated. I have requested : Complex isodose plan. Wedges, rf MLC's will provide 2 additional complex treatment devices.  PLAN:  The patient will receive 5000 Gy in 20 fractions.  ________________________________  Billie Lade, M.D.

## 2012-03-21 ENCOUNTER — Encounter: Payer: Self-pay | Admitting: Radiation Oncology

## 2012-03-21 ENCOUNTER — Ambulatory Visit
Admission: RE | Admit: 2012-03-21 | Discharge: 2012-03-21 | Disposition: A | Discharge status: Home / Self Care | Payer: PRIVATE HEALTH INSURANCE | Source: Ambulatory Visit | Attending: Radiation Oncology | Admitting: Radiation Oncology

## 2012-03-21 DIAGNOSIS — C50419 Malignant neoplasm of upper-outer quadrant of unspecified female breast: Secondary | ICD-10-CM

## 2012-03-21 NOTE — Progress Notes (Signed)
  Radiation Oncology         [phone] ________________________________  Name: Sabrina Sullivan MRN: [id number]  Date: 03/21/2012  DOB: 19-Sep-1941  Simulation Verification Note  Status: outpatient  NARRATIVE: The patient was brought to the treatment unit and placed in the planned treatment position. The clinical setup was verified. Then port films were obtained and uploaded to the radiation oncology medical record software.  The treatment beams were carefully compared against the planned radiation fields. The position location and shape of the radiation fields was reviewed. They targeted volume of tissue appears to be appropriately covered by the radiation beams. Organs at risk appear to be excluded as planned.  Based on my personal review, I approved the simulation verification. The patient's treatment will proceed as planned.  -----------------------------------  Billie Lade, PhD, MD

## 2012-03-22 ENCOUNTER — Ambulatory Visit
Admission: RE | Admit: 2012-03-22 | Discharge: 2012-03-22 | Disposition: A | Discharge status: Home / Self Care | Payer: PRIVATE HEALTH INSURANCE | Source: Ambulatory Visit | Attending: Radiation Oncology | Admitting: Radiation Oncology

## 2012-03-23 ENCOUNTER — Ambulatory Visit
Admission: RE | Admit: 2012-03-23 | Discharge: 2012-03-23 | Disposition: A | Discharge status: Home / Self Care | Payer: PRIVATE HEALTH INSURANCE | Source: Ambulatory Visit | Attending: Radiation Oncology | Admitting: Radiation Oncology

## 2012-03-23 ENCOUNTER — Ambulatory Visit: Payer: PRIVATE HEALTH INSURANCE

## 2012-03-26 ENCOUNTER — Ambulatory Visit
Admission: RE | Admit: 2012-03-26 | Discharge: 2012-03-26 | Disposition: A | Discharge status: Home / Self Care | Payer: PRIVATE HEALTH INSURANCE | Source: Ambulatory Visit | Attending: Radiation Oncology | Admitting: Radiation Oncology

## 2012-03-26 ENCOUNTER — Ambulatory Visit: Admission: RE | Admit: 2012-03-26 | Payer: PRIVATE HEALTH INSURANCE | Source: Ambulatory Visit

## 2012-03-27 ENCOUNTER — Ambulatory Visit
Admission: RE | Admit: 2012-03-27 | Discharge: 2012-03-27 | Disposition: A | Discharge status: Home / Self Care | Payer: PRIVATE HEALTH INSURANCE | Source: Ambulatory Visit | Attending: Radiation Oncology | Admitting: Radiation Oncology

## 2012-03-27 ENCOUNTER — Encounter: Payer: Self-pay | Admitting: Radiation Oncology

## 2012-03-27 VITALS — Wt 137.5 lb

## 2012-03-27 DIAGNOSIS — C50419 Malignant neoplasm of upper-outer quadrant of unspecified female breast: Secondary | ICD-10-CM

## 2012-03-27 MED ORDER — RADIAPLEXRX EX GEL
Freq: Once | CUTANEOUS | Status: AC
  Administered 2012-03-27: 17:00:00 via TOPICAL

## 2012-03-27 MED ORDER — ALRA NON-METALLIC DEODORANT (RAD-ONC)
1.0000 "application " | Freq: Once | TOPICAL | Status: AC
  Administered 2012-03-27: 1 via TOPICAL

## 2012-03-27 NOTE — Progress Notes (Signed)
POST SIM TEACHING DONE WITH PATIENT, VERBALIZES UNDERSTANDING.   GAVE BOOKLET, RADIATION AND YOU WITH PERTINENT INFO MARKED.  ALSO GAVE RADIAPLEX AND ALRA DEODORANT WITH INSTRUCTIONS FOR USE.

## 2012-03-27 NOTE — Progress Notes (Signed)
   Department of Radiation Oncology  Phone:  [phone] Fax:        [phone]   Weekly Management Note Current Dose: 10.0  Gy  Projected Dose: 50.0 Gy   Narrative:  The patient presents for routine under treatment assessment. Port film x-rays were reviewed.  The chart was checked. She is tolerating the radiation treatments well this time without any side effects.  Physical Findings: Weight: 137 lb 8 oz (62.37 kg). The right breast area shows mild erythema and hyperpigmentation changes. The lungs are clear. The heart has a regular rhythm and rate.  Impression:  The patient is tolerating radiation well.  Plan:  Continue treatment as planned.   -----------------------------------  Billie Lade, PhD, MD

## 2012-03-27 NOTE — Progress Notes (Unsigned)
Issued gas card $50.00.  Patient also has concerns about utility bills.  May bring these by at a later date.  Alight balance:  $550.00 [facility] balance:  $400.00

## 2012-03-27 NOTE — Progress Notes (Signed)
HERE TODAY FOR PUT OF RIGHT BREAST.  SKIN LOOKS GOOD.  HAS NOT HAD POST SIM TEACHING YET, WILL DO AFTER DR. KINARD SEES

## 2012-03-27 NOTE — Progress Notes (Signed)
Addendum to previous entry:  Alight Balance:  $600.00 [facility] balance:  $350.00 (gas card issued)

## 2012-03-28 ENCOUNTER — Ambulatory Visit
Admission: RE | Admit: 2012-03-28 | Discharge: 2012-03-28 | Disposition: A | Discharge status: Home / Self Care | Payer: PRIVATE HEALTH INSURANCE | Source: Ambulatory Visit | Attending: Radiation Oncology | Admitting: Radiation Oncology

## 2012-03-28 NOTE — Progress Notes (Signed)
Please see the Nurse Progress Note in the MD Initial Consult Encounter for this patient. 

## 2012-03-28 NOTE — Progress Notes (Signed)
PATIENT CALLED TO ASK QUESTIONS REGARDING HER ROSACEA AND HER FACE GETTING SO HOT DURING TX.  I TOLD HER THAT THE RADIATION WAS AN INTENSE HEAT BUT SHOULD NOT AFFECT HER FACE SINCE SHE IS BEING TREATED FOR BREAST. I TOLD HER TO TALK TO THE THERAPIST REGARDING THIS WHEN SHE CAME IN FOR TX TONIGHT AND SEE IF THEY HAD ANY SUGGESTIONS.  SHE WAS ALSO CONCERNED BECAUSE HER MARKING WAS GETTING LIGHTER, AGAIN I TOLD HER TO CHECK WITH THERAPISTS.

## 2012-03-29 ENCOUNTER — Ambulatory Visit
Admission: RE | Admit: 2012-03-29 | Discharge: 2012-03-29 | Disposition: A | Discharge status: Home / Self Care | Payer: PRIVATE HEALTH INSURANCE | Source: Ambulatory Visit | Attending: Radiation Oncology | Admitting: Radiation Oncology

## 2012-03-30 ENCOUNTER — Ambulatory Visit
Admission: RE | Admit: 2012-03-30 | Discharge: 2012-03-30 | Disposition: A | Discharge status: Home / Self Care | Payer: PRIVATE HEALTH INSURANCE | Source: Ambulatory Visit | Attending: Radiation Oncology | Admitting: Radiation Oncology

## 2012-04-02 ENCOUNTER — Ambulatory Visit
Admission: RE | Admit: 2012-04-02 | Discharge: 2012-04-02 | Disposition: A | Discharge status: Home / Self Care | Payer: PRIVATE HEALTH INSURANCE | Source: Ambulatory Visit | Attending: Radiation Oncology | Admitting: Radiation Oncology

## 2012-04-03 ENCOUNTER — Ambulatory Visit
Admission: RE | Admit: 2012-04-03 | Discharge: 2012-04-03 | Disposition: A | Discharge status: Home / Self Care | Payer: PRIVATE HEALTH INSURANCE | Source: Ambulatory Visit | Attending: Radiation Oncology | Admitting: Radiation Oncology

## 2012-04-03 ENCOUNTER — Encounter: Payer: Self-pay | Admitting: Radiation Oncology

## 2012-04-03 VITALS — BP 119/78 | HR 61 | Temp 97.0°F | Resp 20 | Wt 140.2 lb

## 2012-04-03 DIAGNOSIS — C50419 Malignant neoplasm of upper-outer quadrant of unspecified female breast: Secondary | ICD-10-CM

## 2012-04-03 NOTE — Progress Notes (Signed)
Addendum:9/20 tx completed  10:35 AM

## 2012-04-03 NOTE — Progress Notes (Signed)
Patient alert,oriented x 3,  Slight erythema right breast, c/o soreness under arm where incision is, , eating well, drinking plenty tea water, has had 9/17 txs completed,using radiaplex gel bid 10:23 AM

## 2012-04-03 NOTE — Progress Notes (Signed)
Weekly Management Note Current Dose:  22.5 Gy  Projected Dose: 50 Gy   Narrative:  The patient presents for routine under treatment assessment.  CBCT/MVCT images/Port film x-rays were reviewed.  The chart was checked. Doing well.  Some arm soreness particularly where her arm meets her bra.  Using radiaplex.   Physical Findings: Weight: 140 lb 3.2 oz (63.594 kg). Slightly pink breast.  Impression:  The patient is tolerating radiation.  Plan:  Continue treatment as planned. Continue radiaplex.  We talked about her daughter's treatment and differences between the two.

## 2012-04-04 ENCOUNTER — Ambulatory Visit
Admission: RE | Admit: 2012-04-04 | Discharge: 2012-04-04 | Disposition: A | Discharge status: Home / Self Care | Payer: PRIVATE HEALTH INSURANCE | Source: Ambulatory Visit | Attending: Radiation Oncology | Admitting: Radiation Oncology

## 2012-04-04 DIAGNOSIS — C50419 Malignant neoplasm of upper-outer quadrant of unspecified female breast: Secondary | ICD-10-CM

## 2012-04-05 ENCOUNTER — Ambulatory Visit
Admission: RE | Admit: 2012-04-05 | Discharge: 2012-04-05 | Disposition: A | Discharge status: Home / Self Care | Payer: PRIVATE HEALTH INSURANCE | Source: Ambulatory Visit | Attending: Radiation Oncology | Admitting: Radiation Oncology

## 2012-04-06 ENCOUNTER — Ambulatory Visit
Admission: RE | Admit: 2012-04-06 | Discharge: 2012-04-06 | Disposition: A | Discharge status: Home / Self Care | Payer: PRIVATE HEALTH INSURANCE | Source: Ambulatory Visit | Attending: Radiation Oncology | Admitting: Radiation Oncology

## 2012-04-09 ENCOUNTER — Ambulatory Visit
Admission: RE | Admit: 2012-04-09 | Discharge: 2012-04-09 | Disposition: A | Discharge status: Home / Self Care | Payer: PRIVATE HEALTH INSURANCE | Source: Ambulatory Visit | Attending: Radiation Oncology | Admitting: Radiation Oncology

## 2012-04-09 DIAGNOSIS — C50419 Malignant neoplasm of upper-outer quadrant of unspecified female breast: Secondary | ICD-10-CM

## 2012-04-09 NOTE — Progress Notes (Signed)
   Department of Radiation Oncology  Phone:  [phone] Fax:        [phone]   Weekly Management Note Current Dose: 32.5  Gy  Projected Dose: 42.5 Gy   Narrative:  The patient presents for routine under treatment assessment.  Port film x-rays were reviewed.  The chart was checked.  She is tolerating treatment well except for fatigue. She denies any significant itching or discomfort in the treatment area. She does have some mild discomfort in the axillary area at the site of her sentinel node surgery.  Physical Findings:  The lungs are clear. The heart has regular rhythm and rate. Examination of the right breast reveals some erythema and hyperpigmentation changes. She has mild discomfort with palpation along her right axillary scar. There is no signs of infection in the axillary region.  Impression:  The patient is tolerating radiation.  Plan:  Continue treatment as planned.   -----------------------------------  Billie Lade, PhD, MD

## 2012-04-09 NOTE — Progress Notes (Signed)
HERE TODAY FOR PUT OF RIGHT BREAST.  SKIN FAINT RED, NO DESQUAMATION NOTED.  USING RADIAPLEX.  NO C/O OF PAIN.  DOES C/O BEING FATIGUED AND SLEEPY MORE THAN BEFORE.

## 2012-04-10 ENCOUNTER — Ambulatory Visit
Admission: RE | Admit: 2012-04-10 | Discharge: 2012-04-10 | Disposition: A | Discharge status: Home / Self Care | Payer: PRIVATE HEALTH INSURANCE | Source: Ambulatory Visit | Attending: Radiation Oncology | Admitting: Radiation Oncology

## 2012-04-11 ENCOUNTER — Ambulatory Visit
Admission: RE | Admit: 2012-04-11 | Discharge: 2012-04-11 | Disposition: A | Discharge status: Home / Self Care | Payer: PRIVATE HEALTH INSURANCE | Source: Ambulatory Visit | Attending: Radiation Oncology | Admitting: Radiation Oncology

## 2012-04-11 DIAGNOSIS — C50419 Malignant neoplasm of upper-outer quadrant of unspecified female breast: Secondary | ICD-10-CM

## 2012-04-11 NOTE — Progress Notes (Signed)
  Radiation Oncology         [phone] ________________________________  Name: Sabrina Sullivan MRN: [id number]  Date: 04/11/2012  DOB: 1941/06/24  SIMULATION AND TREATMENT PLANNING NOTE  DIAGNOSIS:  Right breast cancer  Patient underwent additional planning today. The patient's treatment planning CT scan was reviewed she had  had set up with a custom electron cutout field directed at the site of presentation in the lateral aspect of the right breast area. This will be treated with 15 MeV electron prescribed to the 93% isodose line. A special port plan is requested for treatment. She will receive 3 additional treatments with this boost field.  ________________________________   Billie Lade, PhD, MD

## 2012-04-12 ENCOUNTER — Ambulatory Visit
Admission: RE | Admit: 2012-04-12 | Discharge: 2012-04-12 | Disposition: A | Discharge status: Home / Self Care | Payer: PRIVATE HEALTH INSURANCE | Source: Ambulatory Visit | Attending: Radiation Oncology | Admitting: Radiation Oncology

## 2012-04-13 ENCOUNTER — Ambulatory Visit
Admission: RE | Admit: 2012-04-13 | Discharge: 2012-04-13 | Disposition: A | Discharge status: Home / Self Care | Payer: PRIVATE HEALTH INSURANCE | Source: Ambulatory Visit | Attending: Radiation Oncology | Admitting: Radiation Oncology

## 2012-04-17 ENCOUNTER — Ambulatory Visit
Admission: RE | Admit: 2012-04-17 | Discharge: 2012-04-17 | Disposition: A | Discharge status: Home / Self Care | Payer: PRIVATE HEALTH INSURANCE | Source: Ambulatory Visit | Attending: Radiation Oncology | Admitting: Radiation Oncology

## 2012-04-17 ENCOUNTER — Encounter: Payer: Self-pay | Admitting: Radiation Oncology

## 2012-04-17 VITALS — BP 124/88 | HR 65 | Resp 20 | Wt 138.6 lb

## 2012-04-17 DIAGNOSIS — C50419 Malignant neoplasm of upper-outer quadrant of unspecified female breast: Secondary | ICD-10-CM

## 2012-04-17 NOTE — Progress Notes (Signed)
   Department of Radiation Oncology  Phone:  [phone] Fax:        [phone]   Weekly Management Note Current Dose:  45.0 Gy  Projected Dose: 50.0 Gy   Narrative:  The patient presents for routine under treatment assessment.  CBCT/MVCT images/Port film x-rays were reviewed.  The chart was checked.  The patient's electron setup was checked on the linear accelerator today. Patient is having some fatigue. Over the weekend she developed significant discomfort in the low axillary region. This however did respond to over-the-counter medications.  Physical Findings: Weight: 138 lb 9.6 oz (62.869 kg). The lungs are clear. The heart has a regular rhythm and rate. Examination right breast reveals erythema and hyperpigmentation changes but no moist desquamation.  Impression:  The patient is tolerating radiation.  Plan:  Continue treatment as planned for 2 additional treatments.  -----------------------------------  Billie Lade, PhD, MD

## 2012-04-17 NOTE — Progress Notes (Signed)
Pt has been taking  ASA extra-strength 2 tabs over weekend for right axilla pain, burning. She had had good relief. She states axilla is now "only sore". Cont w/Radiaplex in tx area., no desquamation. Pt fatigued.

## 2012-04-18 ENCOUNTER — Ambulatory Visit
Admission: RE | Admit: 2012-04-18 | Discharge: 2012-04-18 | Disposition: A | Discharge status: Home / Self Care | Payer: PRIVATE HEALTH INSURANCE | Source: Ambulatory Visit | Attending: Radiation Oncology | Admitting: Radiation Oncology

## 2012-04-18 ENCOUNTER — Telehealth: Payer: Self-pay | Admitting: Radiation Oncology

## 2012-04-18 ENCOUNTER — Encounter (INDEPENDENT_AMBULATORY_CARE_PROVIDER_SITE_OTHER): Payer: Self-pay | Admitting: General Surgery

## 2012-04-18 NOTE — Telephone Encounter (Signed)
Pt came by today to have income faxed to [facility] for addl financial services, as well as, was given a second $50 gas card.  [facility] [employer]  $300.00 ALIGHT $600.00

## 2012-04-19 ENCOUNTER — Ambulatory Visit
Admission: RE | Admit: 2012-04-19 | Discharge: 2012-04-19 | Disposition: A | Discharge status: Home / Self Care | Payer: PRIVATE HEALTH INSURANCE | Source: Ambulatory Visit | Attending: Radiation Oncology | Admitting: Radiation Oncology

## 2012-04-19 ENCOUNTER — Other Ambulatory Visit: Payer: Self-pay | Admitting: Physical Medicine and Rehabilitation

## 2012-04-20 ENCOUNTER — Ambulatory Visit: Payer: PRIVATE HEALTH INSURANCE

## 2012-04-20 NOTE — Progress Notes (Signed)
  Radiation Oncology         [phone] ________________________________  Name: ISABELLE MATT MRN: [id number]  Date: 04/04/2012  DOB: 04/24/1941  End of Treatment Note  Diagnosis:   Stage I right breast cancer     Indication for treatment:  Breast conservation therapy       Radiation treatment dates:   03/22/2012 through 04/19/2012  Site/dose:   Right breast 4250 cGy in 17 fractions. The site of presentation in the lateral aspect of breast areas boosted further to  dose of 5000 cGy.  Beams/energy:  Tangential fields using 6 MV photons. The site of presentation was boosted further with 15 MeV electrons prescribed to the 93% isodose line  Narrative: The patient tolerated radiation treatment relatively well.   During the last week of treatment she developed some discomfort in the low axillary area. Patient did not experience any moist desquamation  Plan: The patient has completed radiation treatment. The patient will return to radiation oncology clinic for routine followup in one month. I advised them to call or return sooner if they have any questions or concerns related to their recovery or treatment.  -----------------------------------  Billie Lade, PhD, MD

## 2012-04-23 ENCOUNTER — Ambulatory Visit: Payer: PRIVATE HEALTH INSURANCE

## 2012-04-24 ENCOUNTER — Ambulatory Visit: Payer: PRIVATE HEALTH INSURANCE

## 2012-04-25 ENCOUNTER — Ambulatory Visit: Payer: PRIVATE HEALTH INSURANCE

## 2012-04-26 ENCOUNTER — Ambulatory Visit
Admission: RE | Admit: 2012-04-26 | Discharge: 2012-04-26 | Payer: PRIVATE HEALTH INSURANCE | Source: Ambulatory Visit | Attending: Radiation Oncology | Admitting: Radiation Oncology

## 2012-04-26 ENCOUNTER — Telehealth: Payer: Self-pay | Admitting: *Deleted

## 2012-04-26 NOTE — Telephone Encounter (Signed)
patient confirmed over the phone on 04-26-2012

## 2012-04-27 ENCOUNTER — Ambulatory Visit: Admission: RE | Admit: 2012-04-27 | Payer: PRIVATE HEALTH INSURANCE | Source: Ambulatory Visit

## 2012-04-30 ENCOUNTER — Ambulatory Visit: Payer: PRIVATE HEALTH INSURANCE

## 2012-05-01 ENCOUNTER — Ambulatory Visit: Payer: PRIVATE HEALTH INSURANCE

## 2012-05-02 ENCOUNTER — Ambulatory Visit: Payer: PRIVATE HEALTH INSURANCE

## 2012-05-03 ENCOUNTER — Ambulatory Visit: Payer: PRIVATE HEALTH INSURANCE

## 2012-05-03 ENCOUNTER — Encounter: Payer: Self-pay | Admitting: *Deleted

## 2012-05-03 NOTE — Progress Notes (Signed)
[facility] Psychosocial Distress Screening Clinical Social Work  Clinical Social Work was referred by distress screening protocol.  The patient scored an 8 on the Psychosocial Distress Thermometer which indicates moderate distress. Clinical Social Worker contacted pt at home to assess for distress and other psychosocial needs.  Pt stated she was doing "well", and was very pleased with the care she received at [facility].  Pt acknowledged "It was a difficult journey, but the [facility] staff made it an enjoyable experience."  CSW Informed pt of the support services available to her and encouraged her to call with any questions or concerns.      Clinical Social Worker follow up needed:not at this time  Tamala Julian, MSW, LCSW Clinical Social Worker [facility] [phone]

## 2012-05-04 ENCOUNTER — Ambulatory Visit (HOSPITAL_BASED_OUTPATIENT_CLINIC_OR_DEPARTMENT_OTHER): Payer: PRIVATE HEALTH INSURANCE | Admitting: Oncology

## 2012-05-04 ENCOUNTER — Telehealth: Payer: Self-pay | Admitting: *Deleted

## 2012-05-04 ENCOUNTER — Encounter: Payer: Self-pay | Admitting: Oncology

## 2012-05-04 ENCOUNTER — Ambulatory Visit: Payer: PRIVATE HEALTH INSURANCE

## 2012-05-04 VITALS — BP 127/84 | HR 76 | Temp 98.4°F | Ht 64.0 in | Wt 138.0 lb

## 2012-05-04 DIAGNOSIS — C50419 Malignant neoplasm of upper-outer quadrant of unspecified female breast: Secondary | ICD-10-CM

## 2012-05-04 DIAGNOSIS — Z170 Estrogen receptor positive status [ER+]: Secondary | ICD-10-CM

## 2012-05-04 DIAGNOSIS — Z9010 Acquired absence of unspecified breast and nipple: Secondary | ICD-10-CM

## 2012-05-04 MED ORDER — RALOXIFENE HCL 60 MG PO TABS: 60.0000 mg | ORAL_TABLET | Freq: Every day | ORAL | Status: DC

## 2012-05-04 NOTE — Patient Instructions (Addendum)
1. You are doing well.  2. Continue to take the evista  3. I will see you back in 6 months

## 2012-05-04 NOTE — Progress Notes (Signed)
OFFICE PROGRESS NOTE  CC  Sabrina Sullivan,ROY, MD [address] [place] [address] Dr. Almond Lint Dr. 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 She underwent a partial mastectomy with sentinel node procedure. 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 2 sentinel nodes were negative for metastatic disease. Tumor was ER +65% PR receptor negative HER-2/neu negative proliferation marker 12%.  #2 patient is now status post radiation therapy to the right breast administered by Dr. Antony Blackbird. She completed on 04/26/2012.  #3 patient has declined any antiestrogen therapy adjuvantly.  CURRENT THERAPY:Observation  INTERVAL HISTORY: Sabrina Sullivan 71 y.o. female returns for Followup visit today. Clinically she seems to be doing well she tolerated the radiation well without any significant complications. She does feel some fatigue. She however has no nausea vomiting fevers chills night sweats headaches no shortness of breath no chest pains or palpitations no myalgias or arthralgias. Remainder of the 10 point review of systems is negative.  MEDICAL HISTORY: Past Medical History  Diagnosis Date  . Rosacea   . High cholesterol   . Low back pain   . Left arm pain   . Neck pain   . Thoracic back pain   . Complication of anesthesia 2006    POST OP HALLUCINATIONS AND CONFUSIION /"MAYBE SOMETHING FOR PAIN "  . GERD (gastroesophageal reflux disease)   . 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 Ear disorder     TINNITIS  . Urine frequency     AND PT GETS UP AT NIGHT  . Dental crowns present   . Cancer     breast- right    ALLERGIES:  is allergic to  hydrocodone and morphine and related.  MEDICATIONS:  Current Outpatient Prescriptions  Medication Sig Dispense Refill  . calcium citrate (CALCITRATE - DOSED IN MG ELEMENTAL CALCIUM) 950 MG tablet Take 1 tablet by mouth daily.      . cholecalciferol (VITAMIN D) 1000 UNITS tablet Take 1,000 Units by mouth daily.      . diclofenac sodium (VOLTAREN) 1 % GEL Apply 1 application topically daily as needed. For inflammation      . doxycycline (VIBRAMYCIN) 50 MG capsule Take 50 mg by mouth every other day. FOR ROSACEA      . esomeprazole (NEXIUM) 40 MG capsule Take 40 mg by mouth daily.      . fish oil-omega-3 fatty acids 1000 MG capsule Take 1 g by mouth 2 (two) times daily.       Marland Kitchen MAGNESIUM PO Take 1,000 mg by mouth daily.      . meloxicam (MOBIC) 7.5 MG tablet TAKE 1 TABLET BY MOUTH DAILY AS NEEDED FOR PAIN  30 tablet  1  . pravastatin (PRAVACHOL) 20 MG tablet Take 20 mg by mouth daily.      . Probiotic Product (PROBIOTIC FORMULA PO) Take 1 each by mouth daily.      . traMADol (ULTRAM) 50 MG tablet Take 1-2 tablets (50-100 mg total) by mouth 1 day or 1 dose. For pain  60 tablet  2  . vitamin B-12 (CYANOCOBALAMIN) 1000 MCG tablet Take 1,000 mcg by mouth daily.      Marland Kitchen ALPRAZolam (XANAX) 0.5 MG  tablet Take 0.5 mg by mouth 2 (two) times daily.      Marland Kitchen aspirin 500 MG EC tablet Take 500 mg by mouth every 6 (six) hours as needed.      . raloxifene (EVISTA) 60 MG tablet Take 1 tablet (60 mg total) by mouth daily.  90 tablet  5    SURGICAL HISTORY:  Past Surgical History  Procedure Date  . Cervical fusion   . Rotator cuff repair   . Replacement total knee   . Joint replacement   . Tonsillectomy   . Breast lumpectomy 02/02/2012    right  . Abdominal hysterectomy 1970    has ovaries    REVIEW OF SYSTEMS:  Pertinent items are noted in HPI.   PHYSICAL EXAMINATION: General appearance: alert, cooperative and appears stated age Neck: no adenopathy, no carotid bruit, no JVD, supple, symmetrical,  trachea midline and thyroid not enlarged, symmetric, no tenderness/mass/nodules Lymph nodes: Cervical, supraclavicular, and axillary nodes normal. Resp: clear to auscultation bilaterally and normal percussion bilaterally Back: symmetric, no curvature. ROM normal. No CVA tenderness. Cardio: regular rate and rhythm, S1, S2 normal, no murmur, click, rub or gallop GI: soft, non-tender; bowel sounds normal; no masses,  no organomegaly Extremities: extremities normal, atraumatic, no cyanosis or edema Neurologic: Grossly normal Bilateral breast examination left breast no masses nipple discharge no skin changes. Right breast reveals a well-healed incisional scar no masses no nodularity no nipple discharge or inversion. No other rashes. ECOG PERFORMANCE STATUS: 0 - Asymptomatic  Blood pressure 127/84, pulse 76, temperature 98.4 F (36.9 C), temperature source Oral, height 5\' 4"  (1.626 m), weight 138 lb (62.596 kg).  LABORATORY DATA: Lab Results  Component Value Date   WBC 5.9 01/27/2012   HGB 14.2 01/27/2012   HCT 42.9 01/27/2012   MCV 94.7 01/27/2012   PLT 133* 01/27/2012      Chemistry      Component Value Date/Time   NA 143 01/27/2012 1110   K 4.4 01/27/2012 1110   CL 103 01/27/2012 1110   CO2 30 01/27/2012 1110   BUN 19 01/27/2012 1110   CREATININE 1.10 01/27/2012 1110      Component Value Date/Time   CALCIUM 11.2* 01/27/2012 1110   ALKPHOS 47 01/04/2012 1149   AST 20 01/04/2012 1149   ALT 13 01/04/2012 1149   BILITOT 0.4 01/04/2012 1149       RADIOGRAPHIC STUDIES:  No results found.  ASSESSMENT: 71 year old female with stage I invasive ductal carcinoma that was low grade ER positive PR negative HER-2/neu negative. Patient is status post partial mastectomy with sentinel node biopsy. The final pathology did show a 0.7 cm tumor 2 sentinel nodes were negative. She is now status post radiation therapy adjuvantly. Recommendation for adjuvant antiestrogen therapy with an aromatase inhibitor was made however  patient is declining any further therapy. And as she will be followed expectantly.   PLAN: We will continue to follow the patient every 6 to months to a year. She understands the risks and benefits of declining antiestrogen therapy. She is very happy with her decision. She will return in 6 months time for followup.   All questions were answered. The patient knows to call the clinic with any problems, questions or concerns. We can certainly see the patient much sooner if necessary.  I spent 25 minutes counseling the patient face to face. The total time spent in the appointment was 30 minutes.    Drue Second, MD Medical/Oncology [facility] [phone] (beeper)  [phone] (Office)  05/04/2012, 4:42 PM

## 2012-05-07 ENCOUNTER — Telehealth: Payer: Self-pay | Admitting: *Deleted

## 2012-05-07 ENCOUNTER — Ambulatory Visit: Payer: PRIVATE HEALTH INSURANCE

## 2012-05-07 NOTE — Telephone Encounter (Signed)
Gave patient appointment for 10-24-2012 starting at 12:30pm with labs printed out calendar and gave to the patient

## 2012-05-08 ENCOUNTER — Ambulatory Visit: Payer: PRIVATE HEALTH INSURANCE

## 2012-05-09 ENCOUNTER — Encounter
Payer: PRIVATE HEALTH INSURANCE | Attending: Physical Medicine and Rehabilitation | Admitting: Physical Medicine and Rehabilitation

## 2012-05-09 ENCOUNTER — Encounter: Payer: Self-pay | Admitting: Physical Medicine and Rehabilitation

## 2012-05-09 VITALS — BP 128/84 | HR 68 | Resp 16 | Ht 64.0 in | Wt 137.2 lb

## 2012-05-09 DIAGNOSIS — M25569 Pain in unspecified knee: Secondary | ICD-10-CM

## 2012-05-09 DIAGNOSIS — IMO0001 Reserved for inherently not codable concepts without codable children: Secondary | ICD-10-CM

## 2012-05-09 DIAGNOSIS — M47816 Spondylosis without myelopathy or radiculopathy, lumbar region: Secondary | ICD-10-CM

## 2012-05-09 DIAGNOSIS — G5790 Unspecified mononeuropathy of unspecified lower limb: Secondary | ICD-10-CM

## 2012-05-09 DIAGNOSIS — M7918 Myalgia, other site: Secondary | ICD-10-CM

## 2012-05-09 DIAGNOSIS — M545 Low back pain, unspecified: Secondary | ICD-10-CM

## 2012-05-09 DIAGNOSIS — G5793 Unspecified mononeuropathy of bilateral lower limbs: Secondary | ICD-10-CM

## 2012-05-09 DIAGNOSIS — M25562 Pain in left knee: Secondary | ICD-10-CM

## 2012-05-09 DIAGNOSIS — C50419 Malignant neoplasm of upper-outer quadrant of unspecified female breast: Secondary | ICD-10-CM | POA: Insufficient documentation

## 2012-05-09 DIAGNOSIS — M25561 Pain in right knee: Secondary | ICD-10-CM

## 2012-05-09 DIAGNOSIS — M47817 Spondylosis without myelopathy or radiculopathy, lumbosacral region: Secondary | ICD-10-CM

## 2012-05-09 MED ORDER — GABAPENTIN 100 MG PO CAPS: 100.0000 mg | ORAL_CAPSULE | Freq: Three times a day (TID) | ORAL | Status: DC

## 2012-05-09 NOTE — Progress Notes (Deleted)
Subjective:    Patient ID: Sabrina Sullivan, female    DOB: 05/13/1941, 71 y.o.   MRN: [id number]  HPI   She is followed [facility] at [facility] for  chronic pain complaints, predominantly related to her low back and hip  region. She carries diagnosis of lumbar facet arthropathy, mild fascia  gluteal muscle dysfunction. She is status post a right knee replacement  and she also has had problems intermittently with trochanteric bursitis.  Breast Cancer surgery last week and was on dilaudid. She thinks she fell the night that she went home but does really recall. Daughter was staying with her and may have been asleep. She states she is fine except for previous pain complaints in low back and buttock. Radiation treatments are planned. Patient is very stressed from recent events.      Pain Inventory Average Pain 9 Pain Right Now 9 My pain is constant, burning and aching  In the last 24 hours, has pain interfered with the following? General activity 8 Relation with others 8 Enjoyment of life 9 What TIME of day is your pain at its worst? daytime nighttime Sleep (in general) Poor  Pain is worse with: walking, sitting, standing and some activites Pain improves with: heat/ice and medication Relief from Meds: 4  Mobility walk without assistance do you drive?  yes  Function Do you have any goals in this area?  no  Neuro/Psych depression  Prior Studies x-rays  Physicians involved in your care Any changes since last visit?  no   Family History  Problem Relation Age of Onset  . Cancer Mother     uterine  . Cancer Maternal Uncle     lung  . Cancer Daughter     breast   History   Social History  . Marital Status: Divorced    Spouse Name: N/A    Number of Children: N/A  . Years of Education: N/A   Occupational History  . Retired/Disability    Social History Main Topics  . Smoking status: Former Smoker -- 0.5 packs/day    Quit date:  01/22/2008  . Smokeless tobacco: Never Used  . Alcohol Use: Yes  . Drug Use: No  . 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 None   Social History Narrative  . None   Past Surgical History  Procedure Date  . Cervical fusion   . Rotator cuff repair   . Replacement total knee   . Joint replacement   . Tonsillectomy   . Breast lumpectomy 02/02/2012    right  . Abdominal hysterectomy 1970    has ovaries   Past Medical History  Diagnosis Date  . Rosacea   . High cholesterol   . Low back pain   . Left arm pain   . Neck pain   . Thoracic back pain   . Complication of anesthesia 2006    POST OP HALLUCINATIONS AND CONFUSIION /"MAYBE SOMETHING FOR PAIN "  . GERD (gastroesophageal reflux disease)   . 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 Ear disorder     TINNITIS  . Urine frequency     AND PT GETS UP AT NIGHT  . Dental crowns present   . Cancer     breast- right   BP 128/84  Pulse 68  Resp 16  Ht 5\' 4"  (1.626 m)  Wt 137 lb 3.2 oz (62.234 kg)  BMI 23.55 kg/m2  SpO2 95%    Review of Systems  Genitourinary:       Waking up more at night with bladder  Musculoskeletal: Positive for back pain.  Psychiatric/Behavioral: Positive for dysphoric mood.  All other systems reviewed and are negative.       Objective:   Physical Exam  The patient is alert orientated x3 cooperative pleasant follows commands without difficulty answers questions appropriately    Cranial nerves are grossly intact    Coordination is intact   There are no new sensory deficits lower extremities   Reflexes are 2+ patellar and Achilles tendons symmetric   Motor strength manual muscle testing 5/5 hip flexor and extensor dorsiflex or plantar flexor.   Transitioned easily from sitting to standing gait is stable not antalgic   Tandem gait and Romberg test are performed adequately    Internal and external rotation at the hips does not aggravate pain  in growing or posterior hip.   Forward flexion from the lumbar spine is not aggravate pain in good range of motion is noted. Lumbar extension is not aggravating significantly today.    Palpation in the lumbar paraspinal musculature and into gluteal musculature does aggravate. No tenderness noted over either trochanter today.   Tenderness over her left peers scapular region  Tenderness noted over right pesanserine area.  No obvious iliotibial band tenderness on exam today  Pain noted with forward flexion let's go with extension of lumbar spine       Assessment & Plan:  1. Lumbago with history of facet arthropathy. But may have more disc degenerative involvement currently. Patient seems to be having a flareup over the last couple of months with respect to her low back. Pain is typically worse with sitting suggesting more disc involvement. I'm not sure if there is some neuropathic involvement at this time as she has some discomfort radiating from the low back through buttocks posterior thigh and bilateral knees right greater than left   2. History of mild degenerative joint disease of hip.    3. Myofascial gluteal muscle dysfunction/ischial bursitis.    4. Left trochanteric bursitis, overall improved status post injection.    5. Status post cervical surgery, Dr. Newell Coral. The patient has  recently followed up with Dr. Yevette Edwards who saw her. A CT scan of the neck was done in August and Dr. Yevette Edwards recommended continued  use of tramadol and Mobic and consideration of left-sided C5  selective nerve root block should she have worsening pain in this  upper extremity/shoulder area.     Breast surgery has not contributed significantly to pain complaints.  The patient was prescribed Percocet postoperatively but she tells me she is not taking. The side effects profile on Percocet causes nausea and constipation.   She continues to use tramadol 1 tablet one time per day sometimes twice a  day. She does not need a refill at this time   For burning a leg pain and knee pain:  Will trial her on gabapentin low dose start 100 mg at night moving toward 100 mg 3 times a day  Recommended continued use of Pennsaid or Votaren Gel.  She may try lidoderm to periscapular area. She does not think she needs a prescription for this need a prescription for this , she let us know if we need to call some in. Will reassess next month.   She requests refill on  meloxicam.    We also discussed considering repeating her medial branch  blocks at L4-5 and L5-S1. However we are going to defer this option currently.    I've recommended that she start walking on a daily basis again.    We discussed the simultaneous use of Pennsaid/voltaren gel with meloxicam, she understands that these can have additive side effects.  She understands the risks and benefits of both these medications and is trying to minimize use of the meloxicam as much as she can.    I will see her back in 3 months

## 2012-05-09 NOTE — Patient Instructions (Addendum)
I have started you on some gabapentin.  Continue to use your tramadol  Followup with Kaylah Chiasson in 6 weeks  Don't forget to try your Lidoderm and on the area around her shoulder blade  On at the next visit we may consider starting you in some physical therapy

## 2012-05-21 ENCOUNTER — Encounter: Payer: Self-pay | Admitting: Radiation Oncology

## 2012-05-21 ENCOUNTER — Ambulatory Visit
Admission: RE | Admit: 2012-05-21 | Discharge: 2012-05-21 | Disposition: A | Discharge status: Home / Self Care | Payer: PRIVATE HEALTH INSURANCE | Source: Ambulatory Visit | Attending: Radiation Oncology | Admitting: Radiation Oncology

## 2012-05-21 VITALS — BP 130/85 | HR 60 | Temp 98.1°F | Resp 20 | Wt 137.6 lb

## 2012-05-21 DIAGNOSIS — C50419 Malignant neoplasm of upper-outer quadrant of unspecified female breast: Secondary | ICD-10-CM

## 2012-05-21 NOTE — Progress Notes (Signed)
Radiation Oncology         [phone] ________________________________  Name: Sabrina Sullivan MRN: [id number]  Date: 05/21/2012  DOB: July 04, 1941  Follow-Up Visit Note  CC: Ralene Ok, MD  Almond Lint, MD  Diagnosis:   Right breast cancer  Interval Since Last Radiation:  1 months  Narrative:  The patient returns today for routine follow-up.  Doing well except for occasional soreness within the right axillary area. She did start back on Evista after seeing Dr. Lennette Bihari for this issue.                              ALLERGIES:  is allergic to hydrocodone and morphine and related.  Meds: Current Outpatient Prescriptions  Medication Sig Dispense Refill  . ALPRAZolam (XANAX) 0.5 MG tablet Take 0.5 mg by mouth 2 (two) times daily.      . calcium citrate (CALCITRATE - DOSED IN MG ELEMENTAL CALCIUM) 950 MG tablet Take 1 tablet by mouth daily.      . cholecalciferol (VITAMIN D) 1000 UNITS tablet Take 1,000 Units by mouth daily.      Marland Kitchen doxycycline (VIBRAMYCIN) 50 MG capsule Take 50 mg by mouth every other day. FOR ROSACEA      . esomeprazole (NEXIUM) 40 MG capsule Take 40 mg by mouth daily.      . fish oil-omega-3 fatty acids 1000 MG capsule Take 1 g by mouth 2 (two) times daily.       Marland Kitchen gabapentin (NEURONTIN) 100 MG capsule Take 1 capsule (100 mg total) by mouth 3 (three) times daily. Take 1 capsule by mouth at bedtime for 3 days, then take 1 capsule by mouth twice a day for 3 days, then take 1 capsule 3 times a day.  90 capsule  1  . MAGNESIUM PO Take 1,000 mg by mouth daily.      . meloxicam (MOBIC) 7.5 MG tablet TAKE 1 TABLET BY MOUTH DAILY AS NEEDED FOR PAIN  30 tablet  1  . pravastatin (PRAVACHOL) 20 MG tablet Take 20 mg by mouth daily.      . Probiotic Product (PROBIOTIC FORMULA PO) Take 1 each by mouth daily.      . raloxifene (EVISTA) 60 MG tablet Take 1 tablet (60 mg total) by mouth daily.  90 tablet  5  . traMADol (ULTRAM) 50 MG tablet Take 1-2 tablets (50-100 mg total) by mouth 1 day or  1 dose. For pain  60 tablet  2  . vitamin B-12 (CYANOCOBALAMIN) 1000 MCG tablet Take 1,000 mcg by mouth daily.      Marland Kitchen aspirin 500 MG EC tablet Take 500 mg by mouth every 6 (six) hours as needed.      . diclofenac sodium (VOLTAREN) 1 % GEL Apply 1 application topically daily as needed. For inflammation        Physical Findings: The patient is in no acute distress. Patient is alert and oriented.  weight is 137 lb 9.6 oz (62.415 kg). Her oral temperature is 98.1 F (36.7 C). Her blood pressure is 130/85 and her pulse is 60. Her respiration is 20.  the lungs are clear. The heart has regular rhythm and rate. The left breast is free of mass or nipple discharge. The right breast area shows hyperpigmentation changes and edema.  The right axillary area reveals what appears to be a seroma which is mildly tender with palpation. No dominant mass appreciated breast or nipple discharge or  bleeding.  Lab Findings: Lab Results  Component Value Date   WBC 5.9 01/27/2012   HGB 14.2 01/27/2012   HCT 42.9 01/27/2012   MCV 94.7 01/27/2012   PLT 133* 01/27/2012    @LASTCHEM @  Radiographic Findings: No results found.  Impression:  The patient is recovering from the effects of radiation.    Plan:  Routine followup in 6 months.  _____________________________________   Billie Lade, PhD, MD

## 2012-05-21 NOTE — Progress Notes (Signed)
Patient here f/u breast rad tx, =03/22/12-04/19/12, right breast tanning still, well healed, and still tender, still has sharp pains occasionally , pain on back,alert,oriented x3, ambulatory  Steady gait 11:44 AM

## 2012-06-04 ENCOUNTER — Ambulatory Visit (INDEPENDENT_AMBULATORY_CARE_PROVIDER_SITE_OTHER): Payer: PRIVATE HEALTH INSURANCE | Admitting: General Surgery

## 2012-06-04 ENCOUNTER — Encounter (INDEPENDENT_AMBULATORY_CARE_PROVIDER_SITE_OTHER): Payer: Self-pay | Admitting: General Surgery

## 2012-06-04 VITALS — BP 116/70 | HR 68 | Temp 97.2°F | Resp 14 | Ht 64.0 in | Wt 136.2 lb

## 2012-06-04 DIAGNOSIS — C50419 Malignant neoplasm of upper-outer quadrant of unspecified female breast: Secondary | ICD-10-CM

## 2012-06-04 NOTE — Assessment & Plan Note (Signed)
Patient without clinical evidence of disease. She still has a fair amount of soreness and stiffness in her right latissimus. Dr. Roselind Messier has referred her to massage therapy at the cancer center. She will see me back in 3 months and I will see Dr. Welton Flakes in 6 months.  At that point she will get her one-year mammogram scheduled. She is on Evista for anti-estrogen therapy.

## 2012-06-04 NOTE — Patient Instructions (Signed)
OK to get massage  Tylenol or motrin before massage.  Make sure you tell them if massage is painful.  Follow up with me in 3 months.

## 2012-06-04 NOTE — Progress Notes (Signed)
HISTORY: The patient is a 71 year old female 4 months status post breast conservation treatment for stage I right breast cancer. She finished her radiation and is currently taking raloxifene as antiestrogen therapy.  Her main complaints at this point her that her skin on her right breast is still very leathery, and she still has significant muscle stiffness in her right shoulder. She does not have much in the way of breast pain. She did not have right arm swelling. She denies feeling any new masses in her breasts. She has not had significant side effects that she is aware of from her antiestrogen therapy.   PERTINENT REVIEW OF SYSTEMS: Joint pain.   EXAM: Head: Normocephalic and atraumatic.  Eyes:  Conjunctivae are normal. Pupils are equal, round, and reactive to light. No scleral icterus.  Neck:  Normal range of motion. Neck supple. No tracheal deviation present. No thyromegaly present.  Resp: No respiratory distress, normal effort. Breast:  Right breast with dry, hyperpigmented skin after XRT.  No masses or lymphadenopathy are palpated.  Left breast is without masses, skin dimpling, nipple discharge, or lymphadenopathy.  Good range of motion of right arm.   Abd:  Abdomen is soft, non distended and non tender. No masses are palpable.  There is no rebound and no guarding.  Neurological: Alert and oriented to person, place, and time. Coordination normal.  Skin: Skin is warm and dry. No rash noted. No diaphoretic. No erythema. No pallor.  Psychiatric: Normal mood and affect. Normal behavior. Judgment and thought content normal.      ASSESSMENT AND PLAN:   Cancer of upper-outer quadrant of female breast, Right T1aN0M0 Patient without clinical evidence of disease. She still has a fair amount of soreness and stiffness in her right latissimus. Dr. Roselind Messier has referred her to massage therapy at the cancer center. She will see me back in 3 months and I will see Dr. Welton Flakes in 6 months.  At that  point she will get her one-year mammogram scheduled. She is on Evista for anti-estrogen therapy.        Maudry Diego, MD Surgical Oncology, General & Endocrine Surgery [facility]  Ralene Ok, MD Ralene Ok, MD

## 2012-06-13 ENCOUNTER — Encounter: Payer: Self-pay | Admitting: Physical Medicine and Rehabilitation

## 2012-06-13 ENCOUNTER — Encounter
Payer: PRIVATE HEALTH INSURANCE | Attending: Physical Medicine and Rehabilitation | Admitting: Physical Medicine and Rehabilitation

## 2012-06-13 VITALS — BP 119/75 | HR 76 | Resp 14 | Ht 64.0 in | Wt 138.0 lb

## 2012-06-13 DIAGNOSIS — M25559 Pain in unspecified hip: Secondary | ICD-10-CM | POA: Insufficient documentation

## 2012-06-13 DIAGNOSIS — G8929 Other chronic pain: Secondary | ICD-10-CM | POA: Insufficient documentation

## 2012-06-13 MED ORDER — CYCLOBENZAPRINE HCL 5 MG PO TABS: 5.0000 mg | ORAL_TABLET | Freq: Every day | ORAL | Status: DC

## 2012-06-13 MED ORDER — LIDOCAINE 5 % EX PTCH: 1.0000 | MEDICATED_PATCH | CUTANEOUS | Status: AC

## 2012-06-13 NOTE — Addendum Note (Signed)
Addended by: Ashok Cordia on: 06/13/2012 11:19 AM   Modules accepted: Orders

## 2012-06-13 NOTE — Progress Notes (Signed)
Subjective:    Patient ID: Sabrina Sullivan, female    DOB: 02/24/1941, 71 y.o.   MRN: [id number]  HPI She is followed here at [facility] for  chronic pain complaints, predominantly related to her low back and hip  region. She carries diagnosis of lumbar facet arthropathy, mild fascia  gluteal muscle dysfunction. She is status post a right knee replacement  and she also has had problems intermittently with trochanteric bursitis.   Today she reports she to gabapentin 100 mg 1-2 times per day and is helped with her leg/knee pain.  She's been taking meloxicam nightly with some food, she reports it may be helping her legs however she believes most of the pain relief may be coming from gabapentin.  Continues to have complaints of posterior hip and groin pain. Last hip x-ray was back in 2008.   Pain Inventory Average Pain 9 Pain Right Now 9 My pain is constant, burning, tingling and aching  In the last 24 hours, has pain interfered with the following? General activity 8 Relation with others 8 Enjoyment of life 9 What TIME of day is your pain at its worst? all the time Sleep (in general) Fair  Pain is worse with: bending, sitting and some activites Pain improves with: heat/ice and medication Relief from Meds: 6  Mobility walk without assistance how many minutes can you walk? not sure ability to climb steps?  yes do you drive?  yes  Function disabled: date disabled   Neuro/Psych No problems in this area  Prior Studies Any changes since last visit?  no  Physicians involved in your care Any changes since last visit?  no   Family History  Problem Relation Age of Onset  . Cancer Mother     uterine  . Cancer Maternal Uncle     lung  . Cancer Daughter     breast   History   Social History  . Marital Status: Divorced    Spouse Name: N/A    Number of Children: N/A  . Years of Education: N/A   Occupational History  .  Retired/Disability    Social History Main Topics  . Smoking status: Former Smoker -- 0.5 packs/day    Quit date: 01/22/2008  . Smokeless tobacco: Never Used  . Alcohol Use: Yes  . Drug Use: No  . 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 None   Social History Narrative  . None   Past Surgical History  Procedure Date  . Cervical fusion   . Rotator cuff repair   . Replacement total knee   . Joint replacement   . Tonsillectomy   . Breast lumpectomy 02/02/2012    right  . Abdominal hysterectomy 1970    has ovaries   Past Medical History  Diagnosis Date  . Rosacea   . High cholesterol   . Low back pain   . Left arm pain   . Neck pain   . Thoracic back pain   . Complication of anesthesia 2006    POST OP HALLUCINATIONS AND CONFUSIION /"MAYBE SOMETHING FOR PAIN "  . GERD (gastroesophageal reflux disease)   . 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 Ear disorder     TINNITIS  . Urine frequency     AND PT GETS UP AT NIGHT  . Dental crowns present   . Cancer     breast- right  BP 119/75  Pulse 76  Resp 14  Ht 5\' 4"  (1.626 m)  Wt 138 lb (62.596 kg)  BMI 23.69 kg/m2  SpO2 97%     Review of Systems  Musculoskeletal: Positive for myalgias, back pain and arthralgias.  All other systems reviewed and are negative.       Objective:   Physical Exam The patient is alert orientated x3 cooperative pleasant follows commands without difficulty answers questions appropriately  Cranial nerves are grossly intact  Coordination is intact  There are no new sensory deficits lower extremities  Reflexes are 2+ patellar and Achilles tendons symmetric  Motor strength manual muscle testing 5/5 hip flexor and extensor dorsiflex or plantar flexor.  Transitioned easily from sitting to standing gait is stable not antalgic  Tandem gait and Romberg test are performed adequately  Internal and external rotation at the hips does not aggravate pain  in growing or posterior hip.  Forward flexion from the lumbar spine does not aggravate pain and good range of motion is noted. Lumbar extension is not aggravating significantly today.  Palpation in the lumbar paraspinal musculature and into gluteal musculature does aggravate. Moderate  tenderness noted over both trochantesr today.  Tenderness over her left periscapular region  Tenderness noted over right pesanserine area.  No obvious iliotibial band tenderness on exam today  No pain noted with forward flexion        Assessment & Plan:  1. Lumbago with history of facet arthropathy. But may have more disc degenerative involvement currently. Patient seems to be having a flareup over the last couple of months with respect to her low back. Pain is typically worse with sitting suggesting more disc involvement. I'm not sure if there is some neuropathic involvement at this time as she has some discomfort radiating from the low back through buttocks posterior thigh and bilateral knees right greater than left   Plan to continue use of gabapentin 100 mg twice a day.   2. History of mild degenerative joint disease of hip.    3. Myofascial gluteal muscle dysfunction/ischial bursitis.   4. Left trochanteric bursitis, overall improved status post injection.   5. Status post cervical surgery, Dr. Newell Coral. The patient has  recently followed up with Dr. Yevette Edwards who saw her. A CT scan of the neck was done in August and Dr. Yevette Edwards recommended continued  use of tramadol and Mobic and consideration of left-sided C5  selective nerve root block should she have worsening pain in this  upper extremity/shoulder area.   6. Left greater than right hip pain with the groin pain worse especially after first few steps after she's been sitting.  Will repeat hip radiographs to compare with radiographs from 2008   Breast surgery has not contributed significantly to pain complaints.    The patient was  prescribed Percocet postoperatively but she tells me she is not taking. The side effects profile on Percocet causes nausea and constipation.  She continues to use tramadol 1 tablet one time per day sometimes twice a day. She does not need a refill at this time    For burning a leg pain and knee pain:  Will continue gabapentin 100 mg twice a day. Will have her discontinue meloxicam for now.  However continue to use Voltaren gel on a when necessary basis.  Will give another trial of Lidoderm, she's been on this in the past. It has been about 4 years and she has used it. She may try lidoderm to periscapular area.  We also discussed considering repeating her medial branch blocks at L4-5 and L5-S1. However we are going to defer this option currently.    I've recommended that she start walking on a daily basis again.    She understands the risks and benefits of both these medications and is trying to minimize use of the meloxicam as much as she can.    Goals are to maintain/improve function, reduce pain.using physical rehabilitation management options. Minimize use of narcotic pain medication.

## 2012-06-13 NOTE — Patient Instructions (Addendum)
Continue to use your gabapentin as directed  I have ordered Lidoderm patches  I have ordered hip x-rays  I will see you back in 6 weeks  I have ordered a muscle relaxer

## 2012-06-13 NOTE — Progress Notes (Signed)
Subjective:    Patient ID: Sabrina Sullivan, female    DOB: 1940/12/26, 71 y.o.   MRN: [id number]  Back Pain     She is followed here at the [facility] for  chronic pain complaints, predominantly related to her low back and hip  region. She carries diagnosis of lumbar facet arthropathy, mild fascia  gluteal muscle dysfunction. She is status post a right knee replacement  and she also has had problems intermittently with trochanteric bursitis.  Breast Cancer surgery last week and was on dilaudid. She thinks she fell the night that she went home but does really recall. Daughter was staying with her and may have been asleep. She states she is fine except for previous pain complaints in low back and buttock. Radiation treatments are planned. Patient is very stressed from recent events.      Pain Inventory Average Pain 9 Pain Right Now 9 My pain is constant, burning and aching  In the last 24 hours, has pain interfered with the following? General activity 8 Relation with others 8 Enjoyment of life 9 What TIME of day is your pain at its worst? daytime nighttime Sleep (in general) Poor  Pain is worse with: walking, sitting, standing and some activites Pain improves with: heat/ice and medication Relief from Meds: 4  Mobility walk without assistance do you drive?  yes  Function Do you have any goals in this area?  no  Neuro/Psych depression  Prior Studies x-rays  Physicians involved in your care Any changes since last visit?  no   Family History  Problem Relation Age of Onset  . Cancer Mother     uterine  . Cancer Maternal Uncle     lung  . Cancer Daughter     breast   History   Social History  . Marital Status: Divorced    Spouse Name: N/A    Number of Children: N/A  . Years of Education: N/A   Occupational History  . Retired/Disability    Social History Main Topics  . Smoking status: Former Smoker -- 0.5 packs/day    Quit  date: 01/22/2008  . Smokeless tobacco: Never Used  . Alcohol Use: Yes  . Drug Use: No  . 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 None   Social History Narrative  . None   Past Surgical History  Procedure Date  . Cervical fusion   . Rotator cuff repair   . Replacement total knee   . Joint replacement   . Tonsillectomy   . Breast lumpectomy 02/02/2012    right  . Abdominal hysterectomy 1970    has ovaries   Past Medical History  Diagnosis Date  . Rosacea   . High cholesterol   . Low back pain   . Left arm pain   . Neck pain   . Thoracic back pain   . Complication of anesthesia 2006    POST OP HALLUCINATIONS AND CONFUSIION /"MAYBE SOMETHING FOR PAIN "  . GERD (gastroesophageal reflux disease)   . 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 Ear disorder     TINNITIS  . Urine frequency     AND PT GETS UP AT NIGHT  . Dental crowns present   . Cancer     breast- right   BP 128/84  Pulse 68  Resp 16  Ht 5\' 4"  (1.626 m)  Wt 137 lb 3.2  oz (62.234 kg)  BMI 23.55 kg/m2  SpO2 95%    Review of Systems  Genitourinary:       Waking up more at night with bladder  Musculoskeletal: Positive for back pain.  Psychiatric/Behavioral: Positive for dysphoric mood.  All other systems reviewed and are negative.       Objective:   Physical Exam  The patient is alert orientated x3 cooperative pleasant follows commands without difficulty answers questions appropriately    Cranial nerves are grossly intact    Coordination is intact   There are no new sensory deficits lower extremities   Reflexes are 2+ patellar and Achilles tendons symmetric   Motor strength manual muscle testing 5/5 hip flexor and extensor dorsiflex or plantar flexor.   Transitioned easily from sitting to standing gait is stable not antalgic   Tandem gait and Romberg test are performed adequately    Internal and external rotation at the hips does not  aggravate pain in growing or posterior hip.   Forward flexion from the lumbar spine is not aggravate pain in good range of motion is noted. Lumbar extension is not aggravating significantly today.    Palpation in the lumbar paraspinal musculature and into gluteal musculature does aggravate. No tenderness noted over either trochanter today.   Tenderness over her left peers scapular region  Tenderness noted over right pesanserine area.  No obvious iliotibial band tenderness on exam today  Pain noted with forward flexion let's go with extension of lumbar spine       Assessment & Plan:  1. Lumbago with history of facet arthropathy. But may have more disc degenerative involvement currently. Patient seems to be having a flareup over the last couple of months with respect to her low back. Pain is typically worse with sitting suggesting more disc involvement. I'm not sure if there is some neuropathic involvement at this time as she has some discomfort radiating from the low back through buttocks posterior thigh and bilateral knees right greater than left   2. History of mild degenerative joint disease of hip.    3. Myofascial gluteal muscle dysfunction/ischial bursitis.    4. Left trochanteric bursitis, overall improved status post injection.    5. Status post cervical surgery, Dr. Newell Coral. The patient has  recently followed up with Dr. Yevette Edwards who saw her. A CT scan of the neck was done in August and Dr. Yevette Edwards recommended continued  use of tramadol and Mobic and consideration of left-sided C5  selective nerve root block should she have worsening pain in this  upper extremity/shoulder area.     Breast surgery has not contributed significantly to pain complaints.  The patient was prescribed Percocet postoperatively but she tells me she is not taking. The side effects profile on Percocet causes nausea and constipation.   She continues to use tramadol 1 tablet one time per day  sometimes twice a day. She does not need a refill at this time   For burning a leg pain and knee pain:  Will trial her on gabapentin low dose start 100 mg at night moving toward 100 mg 3 times a day  Recommended continued use of Pennsaid or Votaren Gel.  She may try lidoderm to periscapular area. She does not think she needs a prescription for this need a prescription for this , she let us know if we need to call some in. Will reassess next month.   She requests refill on  meloxicam.    We also discussed considering  repeating her medial branch blocks at L4-5 and L5-S1. However we are going to defer this option currently.    I've recommended that she start walking on a daily basis again.    We discussed the simultaneous use of Pennsaid/voltaren gel with meloxicam, she understands that these can have additive side effects.  She understands the risks and benefits of both these medications and is trying to minimize use of the meloxicam as much as she can.    I will see her back in 3 months

## 2012-06-16 ENCOUNTER — Emergency Department (HOSPITAL_COMMUNITY)
Admission: EM | Admit: 2012-06-16 | Discharge: 2012-06-16 | Disposition: A | Discharge status: Home / Self Care | Payer: PRIVATE HEALTH INSURANCE | Attending: Emergency Medicine | Admitting: Emergency Medicine

## 2012-06-16 DIAGNOSIS — N644 Mastodynia: Secondary | ICD-10-CM | POA: Insufficient documentation

## 2012-06-16 DIAGNOSIS — Z853 Personal history of malignant neoplasm of breast: Secondary | ICD-10-CM | POA: Insufficient documentation

## 2012-06-16 DIAGNOSIS — E7800 Pure hypercholesterolemia, unspecified: Secondary | ICD-10-CM | POA: Insufficient documentation

## 2012-06-16 DIAGNOSIS — N61 Mastitis without abscess: Secondary | ICD-10-CM | POA: Insufficient documentation

## 2012-06-16 DIAGNOSIS — K219 Gastro-esophageal reflux disease without esophagitis: Secondary | ICD-10-CM | POA: Insufficient documentation

## 2012-06-16 LAB — CBC WITH DIFFERENTIAL/PLATELET
Basophils Absolute: 0 10*3/uL (ref 0.0–0.1)
Basophils Relative: 0 % (ref 0–1)
Eosinophils Absolute: 0.1 10*3/uL (ref 0.0–0.7)
Eosinophils Relative: 1 % (ref 0–5)
HCT: 41.9 % (ref 36.0–46.0)
Hemoglobin: 14.4 g/dL (ref 12.0–15.0)
Lymphocytes Relative: 15 % (ref 12–46)
Lymphs Abs: 1.1 10*3/uL (ref 0.7–4.0)
MCH: 31.6 pg (ref 26.0–34.0)
MCHC: 34.4 g/dL (ref 30.0–36.0)
MCV: 92.1 fL (ref 78.0–100.0)
Monocytes Absolute: 0.8 10*3/uL (ref 0.1–1.0)
Monocytes Relative: 12 % (ref 3–12)
Neutro Abs: 5 10*3/uL (ref 1.7–7.7)
Neutrophils Relative %: 72 % (ref 43–77)
Platelets: 119 10*3/uL — ABNORMAL LOW (ref 150–400)
RBC: 4.55 MIL/uL (ref 3.87–5.11)
RDW: 12.8 % (ref 11.5–15.5)
WBC: 7 10*3/uL (ref 4.0–10.5)

## 2012-06-16 LAB — BASIC METABOLIC PANEL
BUN: 20 mg/dL (ref 6–23)
CO2: 23 mEq/L (ref 19–32)
Calcium: 9.9 mg/dL (ref 8.4–10.5)
Chloride: 101 mEq/L (ref 96–112)
Creatinine, Ser: 0.98 mg/dL (ref 0.50–1.10)
GFR calc Af Amer: 65 mL/min — ABNORMAL LOW (ref >90)
GFR calc non Af Amer: 56 mL/min — ABNORMAL LOW (ref >90)
Glucose, Bld: 95 mg/dL (ref 70–99)
Potassium: 4.4 mEq/L (ref 3.5–5.1)
Sodium: 136 mEq/L (ref 135–145)

## 2012-06-16 MED ORDER — ONDANSETRON HCL 4 MG/2ML IJ SOLN
4.0000 mg | Freq: Once | INTRAMUSCULAR | Status: AC
  Administered 2012-06-16: 4 mg via INTRAVENOUS
  Filled 2012-06-16: qty 2

## 2012-06-16 MED ORDER — HYDROMORPHONE HCL PF 1 MG/ML IJ SOLN
0.5000 mg | Freq: Once | INTRAMUSCULAR | Status: AC
  Administered 2012-06-16: 0.5 mg via INTRAVENOUS
  Filled 2012-06-16: qty 1

## 2012-06-16 MED ORDER — VANCOMYCIN HCL IN DEXTROSE 1-5 GM/200ML-% IV SOLN
1000.0000 mg | Freq: Once | INTRAVENOUS | Status: AC
  Administered 2012-06-16: 1000 mg via INTRAVENOUS
  Filled 2012-06-16: qty 200

## 2012-06-16 MED ORDER — DIPHENHYDRAMINE HCL 50 MG/ML IJ SOLN
25.0000 mg | Freq: Once | INTRAMUSCULAR | Status: AC
  Administered 2012-06-16: 25 mg via INTRAVENOUS
  Filled 2012-06-16: qty 1

## 2012-06-16 MED ORDER — SULFAMETHOXAZOLE-TRIMETHOPRIM 800-160 MG PO TABS: 1.0000 | ORAL_TABLET | Freq: Two times a day (BID) | ORAL | Status: AC

## 2012-06-16 MED ORDER — OXYCODONE-ACETAMINOPHEN 5-325 MG PO TABS: 1.0000 | ORAL_TABLET | ORAL | Status: AC | PRN

## 2012-06-16 NOTE — ED Notes (Signed)
RN to obtain labs with start of IV 

## 2012-06-16 NOTE — ED Notes (Signed)
Onset of right breast pain last p.m., hard, tender to touch, itches, feels heavy. Endorses hx of infection in this breast following surgery in March and states it feels the same.

## 2012-06-16 NOTE — ED Notes (Signed)
Patient is alert and oriented x3.  She was given DC instructions and follow up visit instructions.  Patient gave verbal understanding. She was DC ambulatory under his own power to home.  V/S stable.  He was not showing any signs of distress on DC 

## 2012-06-16 NOTE — ED Provider Notes (Signed)
History    71 year old female with right breast pain. Gradual onset yesterday evening. She initially thought that her bra was too tight, but when she took it off she noticed an area of tenderness along the lateral aspect of her breast. Since then it has progressively come more painful and warm to the touch. No fevers or chills. No nausea or vomiting. Pain is worse with movements of R shoulder that create tension in this area. The skin itches. No drainage. No nipple discharge. No SOB. Patient is status post right breast lumpectomy with sentinel node biopsy in March. She states she had similar symptoms shortly after that procedure as she does now and was treated for cellulitis.   CSN: [id number]  Arrival date & time 06/16/12  1114   First MD Initiated Contact with Patient 06/16/12 1201      Chief Complaint  Patient presents with  . Breast Pain    Right,lumpectomy 02/02/2012    (Consider location/radiation/quality/duration/timing/severity/associated sxs/prior treatment) HPI  Past Medical History  Diagnosis Date  . Rosacea   . High cholesterol   . Low back pain   . Left arm pain   . Neck pain   . Thoracic back pain   . Complication of anesthesia 2006    POST OP HALLUCINATIONS AND CONFUSIION /"MAYBE SOMETHING FOR PAIN "  . GERD (gastroesophageal reflux disease)   . 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 Ear disorder     TINNITIS  . Urine frequency     AND PT GETS UP AT NIGHT  . Dental crowns present   . Cancer     breast- right    Past Surgical History  Procedure Date  . Cervical fusion   . Rotator cuff repair   . Replacement total knee   . Joint replacement   . Tonsillectomy   . Breast lumpectomy 02/02/2012    right  . Abdominal hysterectomy 1970    has ovaries    Family History  Problem Relation Age of Onset  . Cancer Mother     uterine  . Cancer Maternal Uncle     lung  . Cancer Daughter     breast    History  Substance Use Topics  . Smoking status:  Former Smoker -- 0.5 packs/day    Quit date: 01/22/2008  . Smokeless tobacco: Never Used  . Alcohol Use: Yes    OB History    Grav Para Term Preterm Abortions TAB SAB Ect Mult Living                  Review of Systems   Review of symptoms negative unless otherwise noted in HPI.   Allergies  Hydrocodone and Morphine and related  Home Medications   Current Outpatient Rx  Name Route Sig Dispense Refill  . ALPRAZOLAM 0.5 MG PO TABS Oral Take 0.5 mg by mouth 2 (two) times daily.    Marland Kitchen CALCIUM CITRATE 950 MG PO TABS Oral Take 1 tablet by mouth daily.    Marland Kitchen VITAMIN D 1000 UNITS PO TABS Oral Take 1,000 Units by mouth daily.    . CYCLOBENZAPRINE HCL 5 MG PO TABS Oral Take 1 tablet (5 mg total) by mouth at bedtime. 30 tablet 1  . DICLOFENAC SODIUM 1 % TD GEL Topical Apply 1 application topically daily as needed. For inflammation    . DOXYCYCLINE HYCLATE 50 MG PO CAPS Oral Take 50 mg by mouth every other day. FOR ROSACEA    . ESOMEPRAZOLE MAGNESIUM  40 MG PO CPDR Oral Take 40 mg by mouth daily.    . OMEGA-3 FATTY ACIDS 1000 MG PO CAPS Oral Take 1 g by mouth 2 (two) times daily.     Marland Kitchen GABAPENTIN 100 MG PO CAPS Oral Take 1 capsule (100 mg total) by mouth 3 (three) times daily. Take 1 capsule by mouth at bedtime for 3 days, then take 1 capsule by mouth twice a day for 3 days, then take 1 capsule 3 times a day. 90 capsule 1  . LIDOCAINE 5 % EX PTCH Transdermal Place 1 patch onto the skin daily. Remove & Discard patch within 12 hours or as directed by MD 30 patch 0  . MAGNESIUM PO Oral Take 1,000 mg by mouth daily.    . MELOXICAM 7.5 MG PO TABS  TAKE 1 TABLET BY MOUTH DAILY AS NEEDED FOR PAIN 30 tablet 1  . PRAVASTATIN SODIUM 20 MG PO TABS Oral Take 20 mg by mouth daily.    Marland Kitchen PROBIOTIC FORMULA PO Oral Take 1 each by mouth daily.    Marland Kitchen RALOXIFENE HCL 60 MG PO TABS Oral Take 1 tablet (60 mg total) by mouth daily. 90 tablet 5  . TRAMADOL HCL 50 MG PO TABS Oral Take 1-2 tablets (50-100 mg total) by  mouth 1 day or 1 dose. For pain 60 tablet 2  . VITAMIN B-12 1000 MCG PO TABS Oral Take 1,000 mcg by mouth daily.      BP 104/66  Pulse 111  Temp 98.3 F (36.8 C) (Oral)  Resp 16  SpO2 95%  Physical Exam  Nursing note and vitals reviewed. Constitutional: She appears well-developed and well-nourished. No distress.  HENT:  Head: Normocephalic and atraumatic.  Eyes: Conjunctivae are normal. Right eye exhibits no discharge. Left eye exhibits no discharge.  Neck: Neck supple.  Cardiovascular: Normal rate, regular rhythm and normal heart sounds.  Exam reveals no gallop and no friction rub.   No murmur heard. Pulmonary/Chest: Effort normal and breath sounds normal. No respiratory distress.       Well healed incision lateral aspect of breast. Quarter sized area of induration upper outer qudrant of breast with surrounding erythema and increased warmth. Tender. No fluctuance. No nipple discharge. No axillary nodes palpated.  Abdominal: Soft. She exhibits no distension. There is no tenderness.  Musculoskeletal: She exhibits no edema and no tenderness.  Neurological: She is alert.  Skin: Skin is warm and dry.  Psychiatric: She has a normal mood and affect. Her behavior is normal. Thought content normal.    ED Course  Procedures (including critical care time)  Labs Reviewed  CBC WITH DIFFERENTIAL - Abnormal; Notable for the following:    Platelets 119 (*)  PLATELET COUNT CONFIRMED BY SMEAR   All other components within normal limits  BASIC METABOLIC PANEL   No results found.   1. Cellulitis of breast       MDM  71yF with R breast pain. Suspect cellulitis given exam and itching sensation. Small area of induration but breast not tight and no fluctuance appreciated. Less likely abscess. Suspicion for abscess not high enough to ultrasound at this time and will tx for cellulitis.  PRN pain meds. Close f/u with Dr Donell Beers.         Raeford Razor, MD 06/16/12 1251

## 2012-06-18 ENCOUNTER — Telehealth (INDEPENDENT_AMBULATORY_CARE_PROVIDER_SITE_OTHER): Payer: Self-pay

## 2012-06-18 NOTE — Telephone Encounter (Signed)
1 week.  Also, can u set up for ultrasound to make sure she doesn't have a large seroma? tx FB

## 2012-06-18 NOTE — Telephone Encounter (Signed)
Pt called after being seen in [facility] ER on 7/27 for cellulitis.  Taking Vancomycin.  Do we need to see for f/u in 1-2 wks or sooner?

## 2012-06-19 ENCOUNTER — Other Ambulatory Visit (INDEPENDENT_AMBULATORY_CARE_PROVIDER_SITE_OTHER): Payer: Self-pay | Admitting: General Surgery

## 2012-06-19 DIAGNOSIS — C50919 Malignant neoplasm of unspecified site of unspecified female breast: Secondary | ICD-10-CM

## 2012-06-20 ENCOUNTER — Ambulatory Visit
Admission: RE | Admit: 2012-06-20 | Discharge: 2012-06-20 | Disposition: A | Discharge status: Home / Self Care | Payer: PRIVATE HEALTH INSURANCE | Source: Ambulatory Visit | Attending: General Surgery | Admitting: General Surgery

## 2012-06-20 ENCOUNTER — Other Ambulatory Visit (INDEPENDENT_AMBULATORY_CARE_PROVIDER_SITE_OTHER): Payer: Self-pay | Admitting: General Surgery

## 2012-06-20 DIAGNOSIS — C50919 Malignant neoplasm of unspecified site of unspecified female breast: Secondary | ICD-10-CM

## 2012-06-28 ENCOUNTER — Encounter (INDEPENDENT_AMBULATORY_CARE_PROVIDER_SITE_OTHER): Payer: Self-pay | Admitting: General Surgery

## 2012-06-28 ENCOUNTER — Ambulatory Visit
Admission: RE | Admit: 2012-06-28 | Discharge: 2012-06-28 | Disposition: A | Discharge status: Home / Self Care | Payer: PRIVATE HEALTH INSURANCE | Source: Ambulatory Visit | Attending: Physical Medicine and Rehabilitation | Admitting: Physical Medicine and Rehabilitation

## 2012-06-28 ENCOUNTER — Ambulatory Visit (INDEPENDENT_AMBULATORY_CARE_PROVIDER_SITE_OTHER): Payer: PRIVATE HEALTH INSURANCE | Admitting: General Surgery

## 2012-06-28 VITALS — BP 112/72 | HR 92 | Temp 97.3°F | Resp 18 | Ht 64.0 in | Wt 137.0 lb

## 2012-06-28 DIAGNOSIS — M25559 Pain in unspecified hip: Secondary | ICD-10-CM

## 2012-06-28 DIAGNOSIS — C50419 Malignant neoplasm of upper-outer quadrant of unspecified female breast: Secondary | ICD-10-CM

## 2012-06-28 MED ORDER — METHYLPREDNISOLONE 4 MG PO KIT: PACK | ORAL | Status: AC

## 2012-06-28 MED ORDER — OXYCODONE-ACETAMINOPHEN 5-325 MG PO TABS: 1.0000 | ORAL_TABLET | Freq: Four times a day (QID) | ORAL | Status: DC | PRN

## 2012-06-28 NOTE — Progress Notes (Signed)
HISTORY: Patient is a 71 year old female who is status post breast conservation treatment in March of this year. She had originally some faint redness of her breast and swelling. This resolved with antibiotics. She got radiation. I saw her back in mid July. At that point, she was having some stiffness in her right shoulder and complaining of leathery breast skin. She was not having any breast pain. She went to the emergency department on July 26 for severe breast pain and redness. They placed her on antibiotics. I ordered an ultrasound and she was seen to have a large seroma. This was aspirated. Since the aspiration a week ago she has continued to feel worse.  She has been taking tramadol for pain. She is still having severe discomfort. The skin is so sensitive that even things brushing over the top of that are painful. She is having pain all the way up to her axilla.    EXAM: General:  Alert and oriented.  Looks uncomfortable and stiff.   Incision:  Right breast is much less swollen overall than last exam, but skin on upper outer quadrant is very red, warm,  and sensitive.  There is a small seroma present, but no significant residual amount of fluid.  The leathery breast skin is sloughing off.  There is no apparent lymphocele in the axilla.  There is no arm swelling.      ASSESSMENT AND PLAN:   Cancer of upper-outer quadrant of female breast, Right T1aN0M0 [name] continues to have right breast pain and redness.  This has continued to worsen despite antibiotic treatment and seroma drainage.  Will try steroid dose pack for possible delayed radiation reaction.      Sabrina Diego, MD Surgical Oncology, General & Endocrine Surgery [facility]  Sabrina Ok, MD Sabrina Ok, MD

## 2012-06-28 NOTE — Patient Instructions (Signed)
Take vicodin (hydrocodone) as needed for pain.  Will write steroid taper.  If you are getting more redness, fevers, or feeling worse over next 48 hours, stop steroids and call our office.

## 2012-06-28 NOTE — Assessment & Plan Note (Signed)
Sabrina Sullivan continues to have right breast pain and redness.  This has continued to worsen despite antibiotic treatment and seroma drainage.  Will try steroid dose pack for possible delayed radiation reaction.

## 2012-07-03 ENCOUNTER — Telehealth (INDEPENDENT_AMBULATORY_CARE_PROVIDER_SITE_OTHER): Payer: Self-pay

## 2012-07-03 NOTE — Telephone Encounter (Signed)
Notified pt Rx Diflucan 100mg  x 3 days #3 w/ no refills called to [facility] [phone].

## 2012-07-06 ENCOUNTER — Encounter (INDEPENDENT_AMBULATORY_CARE_PROVIDER_SITE_OTHER): Payer: Self-pay | Admitting: General Surgery

## 2012-07-06 ENCOUNTER — Ambulatory Visit (INDEPENDENT_AMBULATORY_CARE_PROVIDER_SITE_OTHER): Payer: PRIVATE HEALTH INSURANCE | Admitting: General Surgery

## 2012-07-06 VITALS — BP 114/76 | HR 66 | Temp 96.9°F | Resp 16 | Ht 64.0 in | Wt 139.5 lb

## 2012-07-06 DIAGNOSIS — C50419 Malignant neoplasm of upper-outer quadrant of unspecified female breast: Secondary | ICD-10-CM

## 2012-07-06 NOTE — Patient Instructions (Signed)
Follow up in 2 months unless symptoms recur.  Call if you are having problems.

## 2012-07-06 NOTE — Progress Notes (Signed)
HISTORY: Pt had delayed seroma and cellulitis.  Despite seroma drainage and antibiotic treatment, pain, swelling, and redness got worse.  On last visit, we tried steroid taper.  She reports significant improvement.  Her steroid taper is done.    EXAM: General:  Alert and oriented.   Incision:  Redness gone,  Tenderness dramatically improved.  No swelling.      ASSESSMENT AND PLAN:   Cancer of upper-outer quadrant of female breast, Right T1aN0M0 Redness and inflammation nearly resolved with steroid taper.    Follow up in 2 months unless pain, redness, and swelling recur.          Maudry Diego, MD Surgical Oncology, General & Endocrine Surgery [facility]  Ralene Ok, MD Ralene Ok, MD

## 2012-07-06 NOTE — Assessment & Plan Note (Addendum)
Redness and inflammation nearly resolved with steroid taper.    Follow up in 2 months unless pain, redness, and swelling recur.

## 2012-07-11 ENCOUNTER — Other Ambulatory Visit: Payer: Self-pay | Admitting: Internal Medicine

## 2012-07-11 DIAGNOSIS — R52 Pain, unspecified: Secondary | ICD-10-CM

## 2012-07-16 ENCOUNTER — Other Ambulatory Visit: Payer: PRIVATE HEALTH INSURANCE

## 2012-07-25 ENCOUNTER — Encounter
Payer: PRIVATE HEALTH INSURANCE | Attending: Physical Medicine and Rehabilitation | Admitting: Physical Medicine and Rehabilitation

## 2012-07-25 ENCOUNTER — Encounter: Payer: Self-pay | Admitting: Physical Medicine and Rehabilitation

## 2012-07-25 VITALS — BP 108/69 | HR 72 | Resp 14 | Ht 64.0 in | Wt 140.0 lb

## 2012-07-25 DIAGNOSIS — M7918 Myalgia, other site: Secondary | ICD-10-CM

## 2012-07-25 DIAGNOSIS — M545 Low back pain, unspecified: Secondary | ICD-10-CM

## 2012-07-25 DIAGNOSIS — IMO0001 Reserved for inherently not codable concepts without codable children: Secondary | ICD-10-CM

## 2012-07-25 DIAGNOSIS — G8929 Other chronic pain: Secondary | ICD-10-CM | POA: Insufficient documentation

## 2012-07-25 DIAGNOSIS — M25559 Pain in unspecified hip: Secondary | ICD-10-CM | POA: Insufficient documentation

## 2012-07-25 DIAGNOSIS — M7060 Trochanteric bursitis, unspecified hip: Secondary | ICD-10-CM

## 2012-07-25 DIAGNOSIS — M47817 Spondylosis without myelopathy or radiculopathy, lumbosacral region: Secondary | ICD-10-CM

## 2012-07-25 DIAGNOSIS — M76899 Other specified enthesopathies of unspecified lower limb, excluding foot: Secondary | ICD-10-CM

## 2012-07-25 DIAGNOSIS — M47816 Spondylosis without myelopathy or radiculopathy, lumbar region: Secondary | ICD-10-CM

## 2012-07-25 NOTE — Progress Notes (Signed)
Subjective:    Patient ID: Sabrina Sullivan, female    DOB: 1941-11-12, 71 y.o.   MRN: [id number]  HPI  She is followed here at the [facility] for  chronic pain complaints, predominantly related to her low back and hip  region. She carries diagnosis of lumbar facet arthropathy, myofascial  gluteal muscle dysfunction. She is status post a right knee replacement  and she also has had problems intermittently with trochanteric bursitis.    Today she reports she to gabapentin 100 mg 1-2 times per day and is helped with her leg/knee pain.  She's been taking meloxicam nightly with some food, she reports it may be helping her legs however she believes most of the pain relief may be coming from gabapentin.    Continues to have complaints of posterior hip and groin pain. Last hip x-ray was back in 2008.   Still getting good relief with gabapentin 100 mg twice a day. She notices significant improvement in leg pain even on this small dose.    Pain Inventory Average Pain 8 Pain Right Now 9 My pain is constant, sharp, burning, tingling and aching  In the last 24 hours, has pain interfered with the following? General activity 8 Relation with others 8 Enjoyment of life 9 What TIME of day is your pain at its worst? day and night Sleep (in general) Fair  Pain is worse with: walking, sitting, standing and some activites Pain improves with: rest, heat/ice and medication Relief from Meds: 6  Mobility walk without assistance how many minutes can you walk? varies ability to climb steps?  yes do you drive?  yes transfers alone  Function disabled: date disabled   Neuro/Psych No problems in this area  Prior Studies x-rays  Physicians involved in your care Any changes since last visit?  no   Family History  Problem Relation Age of Onset  . Cancer Mother     uterine  . Cancer Maternal Uncle     lung  . Cancer Daughter     breast   History   Social History  . Marital  Status: Divorced    Spouse Name: N/A    Number of Children: N/A  . Years of Education: N/A   Occupational History  . Retired/Disability    Social History Main Topics  . Smoking status: Former Smoker -- 0.5 packs/day    Quit date: 01/22/2008  . Smokeless tobacco: Never Used  . Alcohol Use: Yes  . Drug Use: No  . 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 None   Social History Narrative  . None   Past Surgical History  Procedure Date  . Cervical fusion   . Rotator cuff repair   . Replacement total knee   . Joint replacement   . Tonsillectomy   . Breast lumpectomy 02/02/2012    right  . Abdominal hysterectomy 1970    has ovaries   Past Medical History  Diagnosis Date  . Rosacea   . High cholesterol   . Low back pain   . Left arm pain   . Neck pain   . Thoracic back pain   . Complication of anesthesia 2006    POST OP HALLUCINATIONS AND CONFUSIION /"MAYBE SOMETHING FOR PAIN "  . GERD (gastroesophageal reflux disease)   . 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 Ear disorder     TINNITIS  . Urine frequency  AND PT GETS UP AT NIGHT  . Dental crowns present   . Cancer     breast- right   BP 108/69  Pulse 72  Resp 14  Ht 5\' 4"  (1.626 m)  Wt 140 lb (63.504 kg)  BMI 24.03 kg/m2  SpO2 97%     Review of Systems  Musculoskeletal: Positive for myalgias, back pain and arthralgias.  All other systems reviewed and are negative.       Objective:   Physical Exam The patient is alert orientated x3 cooperative pleasant follows commands without difficulty answers questions appropriately  Cranial nerves are grossly intact  Coordination is intact  There are no new sensory deficits lower extremities  Reflexes are 2+ patellar and Achilles tendons symmetric  Motor strength manual muscle testing 5/5 hip flexor and extensor dorsiflex or plantar flexor.  Transitioned easily from sitting to standing gait is stable not antalgic    Tandem gait and Romberg test are performed adequately  Internal and external rotation at the hips does not aggravate pain in growing or posterior hip.  Forward flexion from the lumbar spine does not aggravate pain and good range of motion is noted. Lumbar extension aggravates most low back pain today.  Palpation in the lumbar paraspinal musculature and into gluteal musculature does aggravate. Moderate tenderness noted over both trochantesr today.  Tenderness over her left periscapular region  Tenderness noted over right pesanserine area.  No obvious iliotibial band tenderness on exam today  No pain noted with forward flexion  Knee exam: No effusion well preserved range of motion, tenderness Is noted along medial joint lines as well  Intact dorsalis pedis bilaterally good pulses, more difficult palpating posterior tibial pulses, feet are warm, dry, no skin abnormalities noted.       Assessment & Plan:  1. Lumbago with history of facet arthropathy. But may have more disc degenerative involvement currently. Patient seems to be having a flareup over the last couple of months with respect to her low back. Pain is typically worse with sitting suggesting more disc involvement. I'm not sure if there is some neuropathic involvement at this time as she has some discomfort radiating from the low back through buttocks posterior thigh and bilateral knees right greater than left   Plan to continue use of gabapentin 100 mg twice a day. Seems to be helping.   2. History of mild degenerative joint disease of hip.    3. Myofascial gluteal muscle dysfunction/ischial bursitis.    4. Left trochanteric bursitis, overall improved status post injection.   5. Status post cervical surgery, Dr. Newell Coral. The patient has  recently followed up with Dr. Yevette Edwards who saw her. A CT scan of the neck was done in August and Dr. Yevette Edwards recommended continued  use of tramadol and Mobic and consideration of left-sided  C5  selective nerve root block should she have worsening pain in this  upper extremity/shoulder area.    6. Left greater than right hip pain with the groin pain worse especially after first few steps after she's been sitting.   Will repeat hip radiographs to compare with radiographs from 2008  Breast surgery has not contributed significantly to pain complaints.  The patient was prescribed Percocet postoperatively but she tells me she is not taking. The side effects profile on Percocet causes nausea and constipation.  She continues to use tramadol 1 tablet one time per day sometimes twice a day. She does not need a refill at this time    For  burning a leg pain and knee pain:  Will continue gabapentin 100 mg twice a day. Will have her discontinue meloxicam for now.  However continue to use Voltaren gel on a when necessary basis.  Will give another trial of Lidoderm, she's been on this in the past. It has been about 4 years and she has used it.  She may try lidoderm to periscapular area.  We also discussed considering repeating her medial branch blocks at L4-5 and L5-S1. However we are going to defer this option currently.    I've recommended that she start walking on a daily basis again.    She has walked approximately 30-45 minutes yesterday.   She understands the risks and benefits of both these medications and is trying to minimize use of the meloxicam as much as she can.  Goals are to maintain/improve function, reduce pain.using physical rehabilitation management options.  Minimize use of narcotic pain medication.

## 2012-07-25 NOTE — Patient Instructions (Addendum)
F/u 2- 3 months  I'm glad your legs are doing better on gabapentin.  I am encouraging you to keep walking however instead of walking 45 minutes at a time consider splinting your walking up into 01-10-29 minute sessions.  These maintain contact with your other health care providers.

## 2012-08-14 ENCOUNTER — Other Ambulatory Visit: Payer: Self-pay | Admitting: *Deleted

## 2012-08-14 ENCOUNTER — Other Ambulatory Visit: Payer: Self-pay | Admitting: Physical Medicine and Rehabilitation

## 2012-08-14 MED ORDER — CYCLOBENZAPRINE HCL 5 MG PO TABS
5.0000 mg | ORAL_TABLET | Freq: Every evening | ORAL | Status: DC | PRN
Start: 2012-08-14 — End: 2013-01-09

## 2012-08-15 ENCOUNTER — Encounter (INDEPENDENT_AMBULATORY_CARE_PROVIDER_SITE_OTHER): Payer: Self-pay | Admitting: General Surgery

## 2012-08-21 ENCOUNTER — Other Ambulatory Visit: Payer: Self-pay | Admitting: *Deleted

## 2012-08-21 MED ORDER — GABAPENTIN 100 MG PO CAPS: 100.0000 mg | ORAL_CAPSULE | Freq: Three times a day (TID) | ORAL | Status: DC

## 2012-08-28 ENCOUNTER — Other Ambulatory Visit: Payer: Self-pay | Admitting: Physical Medicine and Rehabilitation

## 2012-09-03 ENCOUNTER — Encounter (INDEPENDENT_AMBULATORY_CARE_PROVIDER_SITE_OTHER): Payer: PRIVATE HEALTH INSURANCE | Admitting: General Surgery

## 2012-09-06 ENCOUNTER — Ambulatory Visit
Admission: RE | Admit: 2012-09-06 | Discharge: 2012-09-06 | Disposition: A | Discharge status: Home / Self Care | Payer: PRIVATE HEALTH INSURANCE | Source: Ambulatory Visit | Attending: Internal Medicine | Admitting: Internal Medicine

## 2012-09-06 DIAGNOSIS — R52 Pain, unspecified: Secondary | ICD-10-CM

## 2012-09-17 ENCOUNTER — Ambulatory Visit (INDEPENDENT_AMBULATORY_CARE_PROVIDER_SITE_OTHER): Payer: PRIVATE HEALTH INSURANCE | Admitting: General Surgery

## 2012-09-17 VITALS — BP 126/62 | HR 64 | Temp 97.3°F | Resp 18 | Ht 64.0 in | Wt 140.8 lb

## 2012-09-17 DIAGNOSIS — C50419 Malignant neoplasm of upper-outer quadrant of unspecified female breast: Secondary | ICD-10-CM

## 2012-09-17 NOTE — Patient Instructions (Signed)
Get mammogram late jan/early February.  Follow up in me in late February/early march.  Go to ABC class.

## 2012-09-17 NOTE — Progress Notes (Signed)
HISTORY: Pt had delayed seroma and cellulitis.  Despite seroma drainage and antibiotic treatment, pain, swelling, and redness got worse.  Steroid taper improved symptoms.  She now mostly only has discomfort when she reaches out or if breast is bumped.  She has continued to have some skin changes as well.        EXAM: General:  Alert and oriented.   Incision:  Redness gone,  Tenderness less, dramatically improved.  No swelling.  Tender at midpoint of axillary incision and at anterior axillary line near inframammary crease.      ASSESSMENT AND PLAN:   Cancer of upper-outer quadrant of female breast, Right [id number] Pt with decreasing pain but still present.  Send to ABC class.  May need PT.  Follow up in February after mammogram.         Sabrina Diego, MD Surgical Oncology, General & Endocrine Surgery [facility]  Ralene Ok, MD Ralene Ok, MD

## 2012-09-17 NOTE — Assessment & Plan Note (Signed)
Pt with decreasing pain but still present.  Send to ABC class.  May need PT.  Follow up in February after mammogram.

## 2012-10-24 ENCOUNTER — Encounter: Payer: Self-pay | Admitting: Oncology

## 2012-10-24 ENCOUNTER — Ambulatory Visit (HOSPITAL_BASED_OUTPATIENT_CLINIC_OR_DEPARTMENT_OTHER): Payer: PRIVATE HEALTH INSURANCE | Admitting: Oncology

## 2012-10-24 ENCOUNTER — Ambulatory Visit: Payer: PRIVATE HEALTH INSURANCE | Admitting: Physical Medicine and Rehabilitation

## 2012-10-24 ENCOUNTER — Other Ambulatory Visit (HOSPITAL_BASED_OUTPATIENT_CLINIC_OR_DEPARTMENT_OTHER): Payer: PRIVATE HEALTH INSURANCE | Admitting: Lab

## 2012-10-24 ENCOUNTER — Telehealth: Payer: Self-pay | Admitting: *Deleted

## 2012-10-24 DIAGNOSIS — C50419 Malignant neoplasm of upper-outer quadrant of unspecified female breast: Secondary | ICD-10-CM

## 2012-10-24 DIAGNOSIS — E559 Vitamin D deficiency, unspecified: Secondary | ICD-10-CM

## 2012-10-24 DIAGNOSIS — Z170 Estrogen receptor positive status [ER+]: Secondary | ICD-10-CM

## 2012-10-24 LAB — CBC WITH DIFFERENTIAL/PLATELET
EOS%: 3.8 % (ref 0.0–7.0)
LYMPH%: 26.2 % (ref 14.0–49.7)
MCH: 31.1 pg (ref 25.1–34.0)
MCHC: 33.4 g/dL (ref 31.5–36.0)
MCV: 93.1 fL (ref 79.5–101.0)
MONO%: 11.4 % (ref 0.0–14.0)
RBC: 4.44 10*6/uL (ref 3.70–5.45)
RDW: 13.2 % (ref 11.2–14.5)

## 2012-10-24 LAB — COMPREHENSIVE METABOLIC PANEL (CC13)
ALT: 18 U/L (ref 0–55)
Alkaline Phosphatase: 61 U/L (ref 40–150)
Creatinine: 1.2 mg/dL — ABNORMAL HIGH (ref 0.6–1.1)
Sodium: 142 mEq/L (ref 136–145)
Total Bilirubin: 0.39 mg/dL (ref 0.20–1.20)
Total Protein: 6.8 g/dL (ref 6.4–8.3)

## 2012-10-24 NOTE — Patient Instructions (Addendum)
Doing well  We will continue to see you now once a year  Lab Results  Component Value Date   WBC 4.0 10/24/2012   HGB 13.8 10/24/2012   HCT 41.4 10/24/2012   MCV 93.1 10/24/2012   PLT 139* 10/24/2012

## 2012-10-24 NOTE — Telephone Encounter (Signed)
Per md the patient left before getting her one year appointment will put in the mail 10-24-2012 to inform the patient of the 10-24-2013 appointment

## 2012-10-24 NOTE — Progress Notes (Signed)
OFFICE PROGRESS NOTE  CC  MOREIRA,ROY, MD [address] [place] [address] Dr. Almond Lint Dr. 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 She underwent a partial mastectomy with sentinel node procedure. 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 2 sentinel nodes were negative for metastatic disease. Tumor was ER +65% PR receptor negative HER-2/neu negative proliferation marker 12%.  #2 patient is now status post radiation therapy to the right breast administered by Dr. Antony Blackbird. She completed on 04/26/2012.  #3 patient has declined any antiestrogen therapy adjuvantly.  CURRENT THERAPY:Observation  INTERVAL HISTORY: Sabrina Sullivan 71 y.o. female returns for Followup visit today. Clinically she is doing well. She does have complaints of abdominal cramping and some sinus congestion I do think she may have a viral syndrome. Supportive care is recommended. She otherwise has some swelling and tenderness off-and-on in the right breast at the surgical site. She is continuing to heal quite nicely. There has been no reaccumulation of seroma. There is no evidence of mastitis. Remainder of the 10 point review of systems is negative. MEDICAL HISTORY: Past Medical History  Diagnosis Date  . Rosacea   . High cholesterol   . Low back pain   . Left arm pain   . Neck pain   . Thoracic back pain   . Complication of anesthesia 2006    POST OP HALLUCINATIONS AND CONFUSIION /"MAYBE SOMETHING FOR PAIN "  . GERD (gastroesophageal reflux disease)   . 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 Ear disorder     TINNITIS  . Urine frequency     AND PT GETS UP AT  NIGHT  . Dental crowns present   . Cancer     breast- right    ALLERGIES:  is allergic to hydrocodone and morphine and related.  MEDICATIONS:  Current Outpatient Prescriptions  Medication Sig Dispense Refill  . ALPRAZolam (XANAX) 0.5 MG tablet Take 0.5 mg by mouth 2 (two) times daily.      . calcium citrate (CALCITRATE - DOSED IN MG ELEMENTAL CALCIUM) 950 MG tablet Take 1 tablet by mouth daily.      . cholecalciferol (VITAMIN D) 1000 UNITS tablet Take 2,000 Units by mouth daily.       . cyclobenzaprine (FLEXERIL) 5 MG tablet Take 1 tablet (5 mg total) by mouth at bedtime as needed for muscle spasms.  30 tablet  1  . cyclobenzaprine (FLEXERIL) 5 MG tablet TAKE 1 TABLET BY MOUTH EVERY NIGHT AT BEDTIME  30 tablet  1  . diclofenac sodium (VOLTAREN) 1 % GEL Apply 1 application topically daily as needed. For inflammation on knee      . doxycycline (VIBRAMYCIN) 50 MG capsule Take 50 mg by mouth every other day. FOR ROSACEA      . esomeprazole (NEXIUM) 40 MG capsule Take 40 mg by mouth daily as needed.       . fish oil-omega-3 fatty acids 1000 MG capsule Take 2 g by mouth daily.       Marland Kitchen gabapentin (NEURONTIN) 100 MG capsule Take 1 capsule (100 mg total) by mouth 3 (three) times daily.  90 capsule  2  . Magnesium 500 MG CAPS Take  1,000 mg by mouth daily.      . meloxicam (MOBIC) 7.5 MG tablet Take 7.5 mg by mouth daily as needed. For pain      . meloxicam (MOBIC) 7.5 MG tablet TAKE 1 TABLET BY MOUTH DAILY AS NEEDED FOR PAIN  30 tablet  1  . pravastatin (PRAVACHOL) 20 MG tablet Take 20 mg by mouth daily.      . Probiotic Product (PROBIOTIC FORMULA PO) Take 1 each by mouth daily.      . raloxifene (EVISTA) 60 MG tablet Take 1 tablet (60 mg total) by mouth daily.  90 tablet  5  . traMADol (ULTRAM) 50 MG tablet Take 1-2 tablets (50-100 mg total) by mouth 1 day or 1 dose. For pain  60 tablet  2  . vitamin B-12 (CYANOCOBALAMIN) 1000 MCG tablet Take 1,000 mcg by mouth daily.        SURGICAL HISTORY:   Past Surgical History  Procedure Date  . Cervical fusion   . Rotator cuff repair   . Replacement total knee   . Joint replacement   . Tonsillectomy   . Breast lumpectomy 02/02/2012    right  . Abdominal hysterectomy 1970    has ovaries    REVIEW OF SYSTEMS:  Pertinent items are noted in HPI.   PHYSICAL EXAMINATION: General appearance: alert, cooperative and appears stated age Neck: no adenopathy, no carotid bruit, no JVD, supple, symmetrical, trachea midline and thyroid not enlarged, symmetric, no tenderness/mass/nodules Lymph nodes: Cervical, supraclavicular, and axillary nodes normal. Resp: clear to auscultation bilaterally and normal percussion bilaterally Back: symmetric, no curvature. ROM normal. No CVA tenderness. Cardio: regular rate and rhythm, S1, S2 normal, no murmur, click, rub or gallop GI: soft, non-tender; bowel sounds normal; no masses,  no organomegaly Extremities: extremities normal, atraumatic, no cyanosis or edema Neurologic: Grossly normal Bilateral breast examination left breast no masses nipple discharge no skin changes. Right breast reveals a well-healed incisional scar no masses no nodularity no nipple discharge or inversion. No other rashes. ECOG PERFORMANCE STATUS: 0 - Asymptomatic  There were no vitals taken for this visit.  LABORATORY DATA: Lab Results  Component Value Date   WBC 4.0 10/24/2012   HGB 13.8 10/24/2012   HCT 41.4 10/24/2012   MCV 93.1 10/24/2012   PLT 139* 10/24/2012      Chemistry      Component Value Date/Time   NA 136 06/16/2012 1200   K 4.4 06/16/2012 1200   CL 101 06/16/2012 1200   CO2 23 06/16/2012 1200   BUN 20 06/16/2012 1200   CREATININE 0.98 06/16/2012 1200      Component Value Date/Time   CALCIUM 9.9 06/16/2012 1200   ALKPHOS 47 01/04/2012 1149   AST 20 01/04/2012 1149   ALT 13 01/04/2012 1149   BILITOT 0.4 01/04/2012 1149       RADIOGRAPHIC STUDIES:  No results found.  ASSESSMENT: 71 year old female with stage I  invasive ductal carcinoma that was low grade ER positive PR negative HER-2/neu negative. Patient is status post partial mastectomy with sentinel node biopsy. The final pathology did show a 0.7 cm tumor 2 sentinel nodes were negative. She is now status post radiation therapy adjuvantly. Recommendation for adjuvant antiestrogen therapy with an aromatase inhibitor was made however patient is declining any further therapy. And as she will be followed expectantly.   PLAN:   #1 patient will be seen back in one years time.  #2 she was encouraged to go to the ABC classes.  All questions were answered. The patient knows to call the clinic with any problems, questions or concerns. We can certainly see the patient much sooner if necessary.  I spent 15 minutes counseling the patient face to face. The total time spent in the appointment was 30 minutes.    Drue Second, MD Medical/Oncology [facility] [phone] (beeper) [phone] (Office)  10/24/2012, 1:32 PM

## 2012-10-25 LAB — VITAMIN D 25 HYDROXY (VIT D DEFICIENCY, FRACTURES): Vit D, 25-Hydroxy: 41 ng/mL (ref 30–89)

## 2012-10-31 ENCOUNTER — Encounter: Payer: Self-pay | Admitting: Physical Medicine and Rehabilitation

## 2012-10-31 ENCOUNTER — Encounter
Payer: PRIVATE HEALTH INSURANCE | Attending: Physical Medicine and Rehabilitation | Admitting: Physical Medicine and Rehabilitation

## 2012-10-31 VITALS — BP 124/61 | HR 77 | Resp 14 | Ht 64.0 in | Wt 138.4 lb

## 2012-10-31 DIAGNOSIS — M545 Low back pain, unspecified: Secondary | ICD-10-CM | POA: Insufficient documentation

## 2012-10-31 DIAGNOSIS — M47816 Spondylosis without myelopathy or radiculopathy, lumbar region: Secondary | ICD-10-CM

## 2012-10-31 DIAGNOSIS — M7918 Myalgia, other site: Secondary | ICD-10-CM

## 2012-10-31 DIAGNOSIS — M76899 Other specified enthesopathies of unspecified lower limb, excluding foot: Secondary | ICD-10-CM

## 2012-10-31 DIAGNOSIS — M47817 Spondylosis without myelopathy or radiculopathy, lumbosacral region: Secondary | ICD-10-CM | POA: Insufficient documentation

## 2012-10-31 DIAGNOSIS — M7060 Trochanteric bursitis, unspecified hip: Secondary | ICD-10-CM

## 2012-10-31 DIAGNOSIS — IMO0001 Reserved for inherently not codable concepts without codable children: Secondary | ICD-10-CM | POA: Insufficient documentation

## 2012-10-31 NOTE — Progress Notes (Signed)
Subjective:    Patient ID: Sabrina Sullivan, female    DOB: Jun 21, 1941, 70 y.o.   MRN: [id number]  HPI  71 yo woman with h/o breast cancer and chronic low back pain.  She is followed here at the [facility] for   chronic pain complaints, predominantly related to her low back and hip   region. She carries diagnosis of lumbar facet arthropathy, myofascial   gluteal muscle dysfunction. She is status post a right knee replacement   and she also has had problems intermittently with trochanteric bursitis.   Chief complaint low back radiating to Left posterior hip and posterior thigh.    Chart review from January 2010 notes show-At that time had complaints of chronic low  back pain, buttock pain, and left posterior thigh pain.    "She has undergone epidural steroid injection in September 2009,  sacroiliac joint injection in October 2009, and medial branch block and  lumbar spine in November 2009. She recently underwent radiofrequency by  Dr. Stevphen Rochester on November 04, 2008. She is overall very pleased with the  results, she states I am much much better, approximately 75% better. " from notes at that time.  Current average pain 8-9/10  No new b/b sx. No weakness.                    Pain Inventory Average Pain 8 Pain Right Now 9 My pain is burning, tingling, aching and sore  In the last 24 hours, has pain interfered with the following? General activity 7 Relation with others 7 Enjoyment of life 9 What TIME of day is your pain at its worst? daytime, evening and night Sleep (in general) Fair  Pain is worse with: walking, bending, sitting, standing and some activites Pain improves with: rest and heat/ice Relief from Meds: 6  Mobility walk without assistance how many minutes can you walk? varies ability to climb steps?  yes do you drive?  yes transfers alone  Function disabled: date disabled  retired  Neuro/Psych No problems in this area  Prior Studies Any  changes since last visit?  no  Physicians involved in your care Any changes since last visit?  no   Family History  Problem Relation Age of Onset  . Cancer Mother     uterine  . Cancer Maternal Uncle     lung  . Cancer Daughter     breast   History   Social History  . Marital Status: Divorced    Spouse Name: N/A    Number of Children: N/A  . Years of Education: N/A   Occupational History  . Retired/Disability    Social History Main Topics  . Smoking status: Former Smoker -- 0.5 packs/day    Quit date: 01/22/2008  . Smokeless tobacco: Never Used  . Alcohol Use: Yes  . Drug Use: No  . 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 None   Social History Narrative  . None   Past Surgical History  Procedure Date  . Cervical fusion   . Rotator cuff repair   . Replacement total knee   . Joint replacement   . Tonsillectomy   . Breast lumpectomy 02/02/2012    right  . Abdominal hysterectomy 1970    has ovaries   Past Medical History  Diagnosis Date  . Rosacea   . High cholesterol   . Low back pain   .  Left arm pain   . Neck pain   . Thoracic back pain   . Complication of anesthesia 2006    POST OP HALLUCINATIONS AND CONFUSIION /"MAYBE SOMETHING FOR PAIN "  . GERD (gastroesophageal reflux disease)   . 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 Ear disorder     TINNITIS  . Urine frequency     AND PT GETS UP AT NIGHT  . Dental crowns present   . Cancer     breast- right   BP 124/61  Pulse 77  Resp 14  Ht 5\' 4"  (1.626 m)  Wt 138 lb 6.4 oz (62.778 kg)  BMI 23.76 kg/m2  SpO2 93%   Review of Systems  Musculoskeletal: Positive for back pain.  All other systems reviewed and are negative.       Objective:   Physical Exam The patient is alert orientated x3 cooperative pleasant follows commands without difficulty answers questions appropriately   Cranial nerves are grossly intact   Coordination is intact    There are no new sensory deficits lower extremities    Reflexes are 2+ patellar and Achilles tendons symmetric    Motor strength manual muscle testing 5/5 hip flexor and extensor dorsiflex or plantar flexor.    Transitioned easily from sitting to standing gait is stable not antalgic    Tandem gait and Romberg test are performed adequately   Internal and external rotation at the hips does not aggravate pain in growing or posterior hip.    Forward flexion from the lumbar spine does not aggravate pain and good range of motion is noted. Lumbar extension aggravates most low back pain today.    Palpation in the lumbar paraspinal musculature and into gluteal musculature does aggravate. Mild-mod tenderness noted over both trochantesr today.    Tenderness over her left periscapular region   Tenderness noted over right pesanserine area.   No obvious iliotibial band tenderness on exam today   No pain noted with forward flexion   Knee exam: No effusion well preserved range of motion, tenderness Is noted along medial joint lines as well   US arterial Seg multiple 10.17.13 showed No evidence of vasoocclusive disease within either lower extremity.         Assessment & Plan:  1. Lumbago with history of facet arthropathy. But may have more disc degenerative involvement currently. Patient seems to be having a flareup over the last couple of months with respect to her low back. Pain is typically worse with sitting suggesting more disc involvement. I'm not sure if there is some neuropathic involvement at this time as she has some discomfort radiating from the low back through buttocks posterior thigh.   Plan to continue use of gabapentin 100 mg twice a day. Seems to be helping.  Will try lumbar support  meloxicam for 7-10 days  Physical therapy  Modalities.  She is considering repeat radiofrequency. Will bring up again next visit if she is still flared up.       2. History of mild  degenerative joint disease of hip.      3. Myofascial gluteal muscle dysfunction/ischial bursitis.      4. Left trochanteric bursitis, overall improved status post injection.    5. Status post cervical surgery, Dr. Newell Coral. The patient has   recently followed up with Dr. Yevette Edwards who saw her. A CT scan of the neck was done in August and Dr. Yevette Edwards recommended continued   use of tramadol and Mobic and consideration  of left-sided C5   selective nerve root block should she have worsening pain in this   upper extremity/shoulder area.      6. Left greater than right hip pain with the groin pain worse especially after first few steps after she's been sitting.       She continues to use tramadol 1 tablet one time per day sometimes twice a day. She does not need a refill at this time      For burning a leg pain and knee pain:   Will continue gabapentin 100 mg twice a day. However continue to use Voltaren gel on a when necessary basis.     We also discussed considering repeating her medial branch blocks at L4-5 and L5-S1. However we are going to defer this option currently.        She understands the risks and benefits of both these medications and is trying to minimize use of the meloxicam as much as she can.    Goals are to maintain/improve function, reduce pain.using physical rehabilitation management options.   Minimize use of narcotic pain medication.

## 2012-10-31 NOTE — Patient Instructions (Addendum)
I am ordering new a lumbar support  I am ordering physical therapy  Follow up in one month  Take meloxicam for the next 7-10 days.    Back Pain, Adult Low back pain is very common. About 1 in 5 people have back pain.The cause of low back pain is rarely dangerous. The pain often gets better over time.About half of people with a sudden onset of back pain feel better in just 2 weeks. About 8 in 10 people feel better by 6 weeks.  CAUSES Some common causes of back pain include:  Strain of the muscles or ligaments supporting the spine.  Wear and tear (degeneration) of the spinal discs.  Arthritis.  Direct injury to the back. DIAGNOSIS Most of the time, the direct cause of low back pain is not known.However, back pain can be treated effectively even when the exact cause of the pain is unknown.Answering your caregiver's questions about your overall health and symptoms is one of the most accurate ways to make sure the cause of your pain is not dangerous. If your caregiver needs more information, he or she may order lab work or imaging tests (X-rays or MRIs).However, even if imaging tests show changes in your back, this usually does not require surgery. HOME CARE INSTRUCTIONS For many people, back pain returns.Since low back pain is rarely dangerous, it is often a condition that people can learn to [facility] their own.   Remain active. It is stressful on the back to sit or stand in one place. Do not sit, drive, or stand in one place for more than 30 minutes at a time. Take short walks on level surfaces as soon as pain allows.Try to increase the length of time you walk each day.  Do not stay in bed.Resting more than 1 or 2 days can delay your recovery.  Do not avoid exercise or work.Your body is made to move.It is not dangerous to be active, even though your back may hurt.Your back will likely heal faster if you return to being active before your pain is gone.  Pay attention to your  body when you bend and lift. Many people have less discomfortwhen lifting if they bend their knees, keep the load close to their bodies,and avoid twisting. Often, the most comfortable positions are those that put less stress on your recovering back.  Find a comfortable position to sleep. Use a firm mattress and lie on your side with your knees slightly bent. If you lie on your back, put a pillow under your knees.  Only take over-the-counter or prescription medicines as directed by your caregiver. Over-the-counter medicines to reduce pain and inflammation are often the most helpful.Your caregiver may prescribe muscle relaxant drugs.These medicines help dull your pain so you can more quickly return to your normal activities and healthy exercise.  Put ice on the injured area.  Put ice in a plastic bag.  Place a towel between your skin and the bag.  Leave the ice on for 15 to 20 minutes, 3 to 4 times a day for the first 2 to 3 days. After that, ice and heat may be alternated to reduce pain and spasms.  Ask your caregiver about trying back exercises and gentle massage. This may be of some benefit.  Avoid feeling anxious or stressed.Stress increases muscle tension and can worsen back pain.It is important to recognize when you are anxious or stressed and learn ways to manage it.Exercise is a great option. SEEK MEDICAL CARE IF:  You  have pain that is not relieved with rest or medicine.  You have pain that does not improve in 1 week.  You have new symptoms.  You are generally not feeling well. SEEK IMMEDIATE MEDICAL CARE IF:   You have pain that radiates from your back into your legs.  You develop new bowel or bladder control problems.  You have unusual weakness or numbness in your arms or legs.  You develop nausea or vomiting.  You develop abdominal pain.  You feel faint. Document Released: 11/07/2005 Document Revised: 05/08/2012 Document Reviewed: 03/28/2011 [facility] Patient  Information 2013 [place], [place]. e have discussed the importance of posture and body mechanics

## 2012-11-19 ENCOUNTER — Ambulatory Visit: Payer: PRIVATE HEALTH INSURANCE | Attending: Physical Medicine and Rehabilitation

## 2012-11-19 DIAGNOSIS — M545 Low back pain, unspecified: Secondary | ICD-10-CM | POA: Insufficient documentation

## 2012-11-19 DIAGNOSIS — R5381 Other malaise: Secondary | ICD-10-CM | POA: Insufficient documentation

## 2012-11-19 DIAGNOSIS — IMO0001 Reserved for inherently not codable concepts without codable children: Secondary | ICD-10-CM | POA: Insufficient documentation

## 2012-11-19 DIAGNOSIS — M25559 Pain in unspecified hip: Secondary | ICD-10-CM | POA: Insufficient documentation

## 2012-11-23 ENCOUNTER — Encounter: Payer: Self-pay | Admitting: Radiation Oncology

## 2012-11-26 ENCOUNTER — Ambulatory Visit
Admission: RE | Admit: 2012-11-26 | Discharge: 2012-11-26 | Disposition: A | Discharge status: Home / Self Care | Payer: PRIVATE HEALTH INSURANCE | Source: Ambulatory Visit | Attending: Radiation Oncology | Admitting: Radiation Oncology

## 2012-11-26 ENCOUNTER — Ambulatory Visit: Payer: PRIVATE HEALTH INSURANCE | Admitting: Radiation Oncology

## 2012-11-26 ENCOUNTER — Encounter: Payer: Self-pay | Admitting: Radiation Oncology

## 2012-11-26 VITALS — BP 100/70 | HR 100 | Temp 97.9°F | Resp 18 | Wt 140.4 lb

## 2012-11-26 DIAGNOSIS — C50419 Malignant neoplasm of upper-outer quadrant of unspecified female breast: Secondary | ICD-10-CM

## 2012-11-26 HISTORY — DX: Personal history of irradiation: Z92.3

## 2012-11-26 NOTE — Progress Notes (Signed)
Patient presents to the clinic today unaccompanied for follow up appointment with Dr. Roselind Messier. Patient alert and oriented to person, place, and time. No distress noted. Steady gait noted. Pleasant affect noted. Patient denies pain at this time. Patient reports occasional "sore spots" in her right breast that her surgeon refers to a lymphedema. Patient denies nipple discharge. Patient denies palpating lumps or bumps in her right breast. Patient denies nausea, vomiting, headache, dizziness or diarrhea. Patient has no complaints at this time. Reported all findings to Dr. Roselind Messier.

## 2012-11-26 NOTE — Progress Notes (Signed)
Radiation Oncology         [phone] ________________________________  Name: Sabrina Sullivan MRN: [id number]  Date: 11/26/2012  DOB: 02/03/1941  Follow-Up Visit Note  CC: Ralene Ok, MD  Almond Lint, MD  Diagnosis:   Stage I right breast cancer  Interval Since Last Radiation:  7 months   Narrative:  The patient returns today for routine follow-up.  She seems to be doing  well at this time. She did however develop a delayed seroma/cellulitis last fall. This was managed by Dr. Donell Beers.  Patient continues to have some sensitivity in the upper outer aspect of the breast and axillary region but no actual pain. She denies any nipple discharge or bleeding. She denies any problems with swelling in her right arm or hand.                              ALLERGIES:  is allergic to hydrocodone and morphine and related.  Meds: Current Outpatient Prescriptions  Medication Sig Dispense Refill  . ALPRAZolam (XANAX) 0.5 MG tablet Take 0.5 mg by mouth 2 (two) times daily.      . calcium citrate (CALCITRATE - DOSED IN MG ELEMENTAL CALCIUM) 950 MG tablet Take 1 tablet by mouth daily.      . cholecalciferol (VITAMIN D) 1000 UNITS tablet Take 2,000 Units by mouth daily.       . diclofenac sodium (VOLTAREN) 1 % GEL Apply 1 application topically daily as needed. For inflammation on knee      . doxycycline (VIBRAMYCIN) 50 MG capsule Take 50 mg by mouth every other day. FOR ROSACEA      . esomeprazole (NEXIUM) 40 MG capsule Take 40 mg by mouth daily as needed.       . fish oil-omega-3 fatty acids 1000 MG capsule Take 2 g by mouth daily.       Marland Kitchen gabapentin (NEURONTIN) 100 MG capsule Take 1 capsule (100 mg total) by mouth 3 (three) times daily.  90 capsule  2  . Magnesium 500 MG CAPS Take 1,000 mg by mouth daily.      . meloxicam (MOBIC) 7.5 MG tablet Take 7.5 mg by mouth daily as needed. For pain      . pravastatin (PRAVACHOL) 20 MG tablet Take 20 mg by mouth daily.      . Probiotic Product (PROBIOTIC FORMULA  PO) Take 1 each by mouth daily.      . raloxifene (EVISTA) 60 MG tablet Take 1 tablet (60 mg total) by mouth daily.  90 tablet  5  . traMADol (ULTRAM) 50 MG tablet Take 1-2 tablets (50-100 mg total) by mouth 1 day or 1 dose. For pain  60 tablet  2  . vitamin B-12 (CYANOCOBALAMIN) 1000 MCG tablet Take 1,000 mcg by mouth daily.      . cyclobenzaprine (FLEXERIL) 5 MG tablet Take 1 tablet (5 mg total) by mouth at bedtime as needed for muscle spasms.  30 tablet  1    Physical Findings: The patient is in no acute distress. Patient is alert and oriented.  weight is 140 lb 6.4 oz (63.685 kg). Her oral temperature is 97.9 F (36.6 C). Her blood pressure is 100/70 and her pulse is 100. Her respiration is 18. Marland Kitchen  No palpable supraclavicular or axillary adenopathy. The lungs are clear to auscultation. The heart has regular rhythm and rate. Examination of the left breast reveals no mass or nipple discharge. Examination right  breast reveals some hyperpigmentation changes and induration near the lumpectomy scar. There is no dominant mass appreciated in the  breast nipple discharge or bleeding. Patient does have some sensitivity with palpation along the upper outer quadrant and axillary region.  Lab Findings: Lab Results  Component Value Date   WBC 4.0 10/24/2012   HGB 13.8 10/24/2012   HCT 41.4 10/24/2012   MCV 93.1 10/24/2012   PLT 139* 10/24/2012      Radiographic Findings: No results found.  Impression:  The patient is recovering from the effects of radiation.  No evidence for recurrence on clinical exam today  Plan:  When necessary followup in radiation oncology. Patient continue followup in surgery and medical oncology. Patient is in the process of scheduling her mammograms.  _____________________________________   Billie Lade, PhD, MD

## 2012-11-28 ENCOUNTER — Ambulatory Visit: Payer: PRIVATE HEALTH INSURANCE | Attending: Physical Medicine and Rehabilitation

## 2012-11-28 DIAGNOSIS — IMO0001 Reserved for inherently not codable concepts without codable children: Secondary | ICD-10-CM | POA: Insufficient documentation

## 2012-11-28 DIAGNOSIS — R5381 Other malaise: Secondary | ICD-10-CM | POA: Insufficient documentation

## 2012-11-28 DIAGNOSIS — M545 Low back pain, unspecified: Secondary | ICD-10-CM | POA: Insufficient documentation

## 2012-11-28 DIAGNOSIS — M25559 Pain in unspecified hip: Secondary | ICD-10-CM | POA: Insufficient documentation

## 2012-12-05 ENCOUNTER — Ambulatory Visit: Payer: PRIVATE HEALTH INSURANCE

## 2012-12-12 ENCOUNTER — Ambulatory Visit: Payer: PRIVATE HEALTH INSURANCE

## 2012-12-20 ENCOUNTER — Ambulatory Visit: Payer: PRIVATE HEALTH INSURANCE

## 2013-01-01 ENCOUNTER — Other Ambulatory Visit: Payer: Self-pay | Admitting: Obstetrics and Gynecology

## 2013-01-01 ENCOUNTER — Telehealth: Payer: Self-pay

## 2013-01-01 ENCOUNTER — Other Ambulatory Visit: Payer: Self-pay | Admitting: Internal Medicine

## 2013-01-01 DIAGNOSIS — Z853 Personal history of malignant neoplasm of breast: Secondary | ICD-10-CM

## 2013-01-01 NOTE — Telephone Encounter (Signed)
Patient has questions regarding medications.

## 2013-01-02 ENCOUNTER — Encounter: Payer: PRIVATE HEALTH INSURANCE | Admitting: Physical Medicine and Rehabilitation

## 2013-01-02 NOTE — Telephone Encounter (Signed)
Patient says her insurance will no longer cover flexeril ($29) and she wants to get something else.  She is going to call her insurance to find out what they cover.

## 2013-01-08 ENCOUNTER — Other Ambulatory Visit: Payer: Self-pay | Admitting: Gastroenterology

## 2013-01-08 DIAGNOSIS — R1310 Dysphagia, unspecified: Secondary | ICD-10-CM

## 2013-01-09 ENCOUNTER — Encounter: Payer: Self-pay | Admitting: Physical Medicine and Rehabilitation

## 2013-01-09 ENCOUNTER — Encounter
Payer: PRIVATE HEALTH INSURANCE | Attending: Physical Medicine and Rehabilitation | Admitting: Physical Medicine and Rehabilitation

## 2013-01-09 VITALS — BP 123/64 | HR 72 | Resp 16 | Ht 64.0 in | Wt 136.6 lb

## 2013-01-09 DIAGNOSIS — M545 Low back pain, unspecified: Secondary | ICD-10-CM | POA: Insufficient documentation

## 2013-01-09 DIAGNOSIS — M7918 Myalgia, other site: Secondary | ICD-10-CM

## 2013-01-09 DIAGNOSIS — M47817 Spondylosis without myelopathy or radiculopathy, lumbosacral region: Secondary | ICD-10-CM | POA: Insufficient documentation

## 2013-01-09 DIAGNOSIS — IMO0001 Reserved for inherently not codable concepts without codable children: Secondary | ICD-10-CM | POA: Insufficient documentation

## 2013-01-09 DIAGNOSIS — M76899 Other specified enthesopathies of unspecified lower limb, excluding foot: Secondary | ICD-10-CM | POA: Insufficient documentation

## 2013-01-09 DIAGNOSIS — M7070 Other bursitis of hip, unspecified hip: Secondary | ICD-10-CM | POA: Insufficient documentation

## 2013-01-09 MED ORDER — METHOCARBAMOL 500 MG PO TABS: 500.0000 mg | ORAL_TABLET | Freq: Every evening | ORAL | Status: DC | PRN

## 2013-01-09 MED ORDER — TRAMADOL HCL 50 MG PO TABS: 50.0000 mg | ORAL_TABLET | Freq: Two times a day (BID) | ORAL | Status: DC | PRN

## 2013-01-09 NOTE — Patient Instructions (Signed)
I have reordered tramadol for you  I have also ordered Robaxin to take as needed at bedtime.  Please continue to use ice for your ischial  Bursitis   Please continue to do your stretches  Continue to use your foam pad for sitting.  follow up in 2-3 months.

## 2013-01-09 NOTE — Progress Notes (Signed)
Subjective:    Patient ID: Sabrina Sullivan, female    DOB: November 16, 1941, 72 y.o.   MRN: [id number]  HPI   72 yo woman with h/o breast cancer and chronic low back pain.   She is followed here at the [facility] PM[facility]R for    chronic pain complaints, predominantly related to her low back and hip    region. She carries diagnosis of lumbar facet arthropathy, myofascial    gluteal muscle dysfunction. She is status post a right knee replacement    and she also has had problems intermittently with trochanteric bursitis.   Her chief complaint today is not as much low back pain as it is pain in the region of her ischium bilaterally. Pain is worse when she's sitting.  There is no new numbness tingling weakness. No new falls or injuries. She's had this problem intermittently to various degrees over a last several years. She tribute status to a fall she had many years ago.  She participated in physical therapy with the last month attended 3 of 4 sessions( missed due to weather and illness.)  She is requesting a refill of tramadol which she takes once to twice a day as well as a muscle relaxer for at night.  She states her insurance company will not pay for flexeril 5 mg each bedtime.     Pain Inventory Average Pain 8 Pain Right Now 8 My pain is burning  In the last 24 hours, has pain interfered with the following? General activity 8 Relation with others 8 Enjoyment of life 9 What TIME of day is your pain at its worst? varies Sleep (in general) Fair  Pain is worse with: sitting Pain improves with: rest and heat/ice Relief from Meds: 6  Mobility walk without assistance Do you have any goals in this area?  no  Function Do you have any goals in this area?  no  Neuro/Psych depression anxiety  Prior Studies Any changes since last visit?  no  Physicians involved in your care Any changes since last visit?  no   Family History  Problem Relation Age of Onset  . Cancer Mother      uterine  . Cancer Maternal Uncle     lung  . Cancer Daughter     breast   History   Social History  . Marital Status: Divorced    Spouse Name: N/A    Number of Children: N/A  . Years of Education: N/A   Occupational History  . Retired/Disability    Social History Main Topics  . Smoking status: Former Smoker -- 0.50 packs/day    Quit date: 01/22/2008  . Smokeless tobacco: Never Used  . Alcohol Use: Yes  . Drug Use: No  . 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 None   Social History Narrative  . None   Past Surgical History  Procedure Laterality Date  . Cervical fusion    . Rotator cuff repair    . Replacement total knee    . Joint replacement    . Tonsillectomy    . Breast lumpectomy  02/02/2012    right  . Abdominal hysterectomy  1970    has ovaries   Past Medical History  Diagnosis Date  . Rosacea   . High cholesterol   . Low back pain   . Left arm pain   . Neck pain   . Thoracic back  pain   . Complication of anesthesia 2006    POST OP HALLUCINATIONS AND CONFUSIION /"MAYBE SOMETHING FOR PAIN "  . GERD (gastroesophageal reflux disease)   . 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 Ear disorder     TINNITIS  . Urine frequency     AND PT GETS UP AT NIGHT  . Dental crowns present   . Cancer     breast- right  . History of radiation therapy 03/22/12-04/19/12    right breast   BP 123/64  Pulse 72  Resp 16  Ht 5\' 4"  (1.626 m)  Wt 136 lb 9.6 oz (61.961 kg)  BMI 23.44 kg/m2  SpO2 95%     Review of Systems  Musculoskeletal: Positive for back pain.  Psychiatric/Behavioral: Positive for dysphoric mood. The patient is nervous/anxious.   All other systems reviewed and are negative.       Objective:   Physical Exam  Patient is a well-developed well-nourished woman who does not appear in any distress  She is oriented x3 speech is clear affect is bright she's alert cooperative and pleasant follows  commands with difficulty answers questions appropriately  Her reflexes are symmetric and intact in the lower extremities  There are no sensory deficits appreciated in the lower extremities  Motor strength is 5 over 5 at hip flexion knee extension knee flexion dorsiflexion and plantarflexion EHL  Straight leg raise negative  Transitions easily from sit to stand  Gait is nonantalgic symmetric  Heel toe walking normal tandem gait Romberg test normal  Flexion extension of lumbar spine reveals good motion without exacerbation of pain in low back with flexion or extension today.  Some mild tenderness over the trochanters bilaterally  More significant tenderness noted over bilateral ischium with palpation.     Assessment & Plan:  1. ischial  bursitis:  Plan: Icing 3-4 times a day for 10-15 minutes  Use foam pad when sitting  Continued to do lower extremity stretches  Voltaren gel as needed to area to 4 times a day  2. Lumbago with history of facet arthropathy:.improved, not main complaint today  Plan to continue use of gabapentin 100 mg twice a day. Seems to be helping.  Physical therapy completed last month LBP seems better Hold off on radiofrequency.   2. History of mild degenerative joint disease of hip.   3. Myofascial gluteal muscle dysfunction/ischial bursitis.   4. Left trochanteric bursitis, overall improved status post injection.   5. Status post cervical surgery, Dr. Newell Coral. The patient has    recently followed up with Dr. Yevette Edwards who saw her. A CT scan of the neck was done in August and Dr. Yevette Edwards recommended continued    use of tramadol and Mobic and consideration of left-sided C5    selective nerve root block should she have worsening pain in this    upper extremity/shoulder area.      She continues to use tramadol 1 tablet one time per day sometimes twice a day. Will refill tramadol 50 mg one per os twice a day when necessary   For burning a leg  pain and knee pain:    Will continue gabapentin 100 mg twice a day. However continue to use Voltaren gel on a when necessary basis.       F/u in 3 months            She understands the risks and benefits of both these medications and is trying to minimize use of the meloxicam  as much as she can.     Goals are to maintain/improve function, reduce pain.using physical rehabilitation management options.    Minimize use of narcotic pain medication.

## 2013-01-10 ENCOUNTER — Ambulatory Visit
Admission: RE | Admit: 2013-01-10 | Discharge: 2013-01-10 | Disposition: A | Discharge status: Home / Self Care | Payer: PRIVATE HEALTH INSURANCE | Source: Ambulatory Visit | Attending: Gastroenterology | Admitting: Gastroenterology

## 2013-01-10 DIAGNOSIS — R1310 Dysphagia, unspecified: Secondary | ICD-10-CM

## 2013-01-15 ENCOUNTER — Ambulatory Visit
Admission: RE | Admit: 2013-01-15 | Discharge: 2013-01-15 | Disposition: A | Discharge status: Home / Self Care | Payer: PRIVATE HEALTH INSURANCE | Source: Ambulatory Visit | Attending: Internal Medicine | Admitting: Internal Medicine

## 2013-01-15 DIAGNOSIS — Z853 Personal history of malignant neoplasm of breast: Secondary | ICD-10-CM

## 2013-01-17 ENCOUNTER — Ambulatory Visit (INDEPENDENT_AMBULATORY_CARE_PROVIDER_SITE_OTHER): Payer: PRIVATE HEALTH INSURANCE | Admitting: General Surgery

## 2013-01-17 VITALS — BP 140/82 | HR 97 | Temp 96.8°F | Resp 18 | Ht 64.0 in | Wt 137.2 lb

## 2013-01-17 DIAGNOSIS — C50419 Malignant neoplasm of upper-outer quadrant of unspecified female breast: Secondary | ICD-10-CM

## 2013-01-17 DIAGNOSIS — C50411 Malignant neoplasm of upper-outer quadrant of right female breast: Secondary | ICD-10-CM

## 2013-01-17 NOTE — Progress Notes (Signed)
HISTORY: Patient is a 72 year old female who is almost 1 year status post breast conservation treatment for right breast cancer. She had delayed cellulitis versus reaction to radiation. She did not respond to antibiotics but did respond to steroids. I saw her last October and this had resolved.  She is now doing much better overall. She sees physical therapy for back pain. She is taking raloxifene for chemoprevention.   PERTINENT REVIEW OF SYSTEMS: Otherwise negative times 11.   EXAM: Head: Normocephalic and atraumatic.  Eyes:  Conjunctivae are normal. Pupils are equal, round, and reactive to light. No scleral icterus.  Neck:  Normal range of motion. Neck supple. No tracheal deviation present. No thyromegaly present.  Resp: No respiratory distress, normal effort. Breast:  Thickening at scar, non tender, no erythema.   Abd:  Abdomen is soft, non distended and non tender. No masses are palpable.  There is no rebound and no guarding.  Neurological: Alert and oriented to person, place, and time. Coordination normal.  Skin: Skin is warm and dry. No rash noted. No diaphoretic. No erythema. No pallor.  Psychiatric: Normal mood and affect. Normal behavior. Judgment and thought content normal.      ASSESSMENT AND PLAN:   Cancer of upper-outer quadrant of female breast, Right [id number] No clinical evidence of disease.  Cellulitis resolved.  Follow up with me this summer, then with Dr. Welton Flakes next December.        Maudry Diego, MD Surgical Oncology, General & Endocrine Surgery [facility]  Ralene Ok, MD Ralene Ok, MD

## 2013-01-17 NOTE — Patient Instructions (Signed)
Follow up with me in 5 months.

## 2013-01-17 NOTE — Assessment & Plan Note (Signed)
No clinical evidence of disease.  Cellulitis resolved.  Follow up with me this summer, then with Dr. Welton Flakes next December.

## 2013-01-31 ENCOUNTER — Other Ambulatory Visit: Payer: Self-pay | Admitting: Gastroenterology

## 2013-02-14 ENCOUNTER — Other Ambulatory Visit: Payer: Self-pay | Admitting: Physical Medicine and Rehabilitation

## 2013-02-18 ENCOUNTER — Telehealth: Payer: Self-pay

## 2013-02-18 NOTE — Telephone Encounter (Signed)
Methocarbamol approved #[id number]

## 2013-03-15 ENCOUNTER — Other Ambulatory Visit: Payer: Self-pay | Admitting: Physical Medicine and Rehabilitation

## 2013-04-03 ENCOUNTER — Encounter: Payer: PRIVATE HEALTH INSURANCE | Admitting: Physical Medicine and Rehabilitation

## 2013-04-17 ENCOUNTER — Encounter: Payer: Self-pay | Admitting: Physical Medicine and Rehabilitation

## 2013-04-17 ENCOUNTER — Encounter
Payer: PRIVATE HEALTH INSURANCE | Attending: Physical Medicine and Rehabilitation | Admitting: Physical Medicine and Rehabilitation

## 2013-04-17 VITALS — BP 117/82 | HR 69 | Resp 14 | Ht 64.0 in | Wt 139.8 lb

## 2013-04-17 DIAGNOSIS — M76899 Other specified enthesopathies of unspecified lower limb, excluding foot: Secondary | ICD-10-CM

## 2013-04-17 DIAGNOSIS — IMO0001 Reserved for inherently not codable concepts without codable children: Secondary | ICD-10-CM

## 2013-04-17 DIAGNOSIS — G8929 Other chronic pain: Secondary | ICD-10-CM | POA: Insufficient documentation

## 2013-04-17 DIAGNOSIS — M47816 Spondylosis without myelopathy or radiculopathy, lumbar region: Secondary | ICD-10-CM

## 2013-04-17 DIAGNOSIS — M545 Low back pain, unspecified: Secondary | ICD-10-CM

## 2013-04-17 DIAGNOSIS — M47817 Spondylosis without myelopathy or radiculopathy, lumbosacral region: Secondary | ICD-10-CM

## 2013-04-17 DIAGNOSIS — M1610 Unilateral primary osteoarthritis, unspecified hip: Secondary | ICD-10-CM | POA: Insufficient documentation

## 2013-04-17 DIAGNOSIS — M7070 Other bursitis of hip, unspecified hip: Secondary | ICD-10-CM

## 2013-04-17 DIAGNOSIS — M79609 Pain in unspecified limb: Secondary | ICD-10-CM | POA: Insufficient documentation

## 2013-04-17 DIAGNOSIS — M7061 Trochanteric bursitis, right hip: Secondary | ICD-10-CM

## 2013-04-17 DIAGNOSIS — M25519 Pain in unspecified shoulder: Secondary | ICD-10-CM | POA: Insufficient documentation

## 2013-04-17 DIAGNOSIS — M7918 Myalgia, other site: Secondary | ICD-10-CM

## 2013-04-17 DIAGNOSIS — M169 Osteoarthritis of hip, unspecified: Secondary | ICD-10-CM | POA: Insufficient documentation

## 2013-04-17 MED ORDER — TRAMADOL-ACETAMINOPHEN 37.5-325 MG PO TABS: 1.0000 | ORAL_TABLET | Freq: Four times a day (QID) | ORAL | Status: DC | PRN

## 2013-04-17 NOTE — Progress Notes (Signed)
Subjective:    Patient ID: Sabrina Sullivan, female    DOB: 08-12-1941, 72 y.o.   MRN: [id number]  HPI 72 yo woman with h/o breast cancer and chronic low back pain.  She is followed here at the [facility] for  chronic pain complaints, predominantly related to her low back and hip  region. She carries diagnosis of lumbar facet arthropathy, myofascial  gluteal muscle dysfunction. She is status post a right knee replacement  and she also has had problems intermittently with trochanteric bursitis.  Her chief complaint today is not as much low back pain as it is pain in the region of her ischium bilaterally and right lateral hip (trochanteric bursitis) Pain is worse when she's sitting.  There is no new numbness tingling weakness. No new falls or injuries. She's had this problem intermittently to various degrees over a last several years. She tribute status to a fall she had many years ago.   He reports that she had a fall about a month ago onto her right knee (tripped on a CAT)  Saw Dr. Valentina Gu who ordered x-rays no fractures noted treated conservatively  Here today for refill of medications. She uses gabapentin twice a day. She uses tramadol when necessary. As Lidoderm which she uses on her right knee on occasion. Uses Voltaren gel when necessary as well.  Has participated in physical therapy as home exercise program.  Overall fairly active woman.  Pain is worse with prolonged sitting and transitional movements, notes stiffness especially after sitting.   Pain Inventory Average Pain 8 Pain Right Now 9 My pain is constant, sharp, burning, tingling and aching  In the last 24 hours, has pain interfered with the following? General activity 7 Relation with others 9 Enjoyment of life 9 What TIME of day is your pain at its worst? evening and night Sleep (in general) Fair  Pain is worse with: bending, sitting, standing and some activites Pain improves with: rest, heat/ice and  medication Relief from Meds: 6  Mobility walk without assistance ability to climb steps?  yes do you drive?  yes  Function Do you have any goals in this area?  no  Neuro/Psych No problems in this area  Prior Studies Any changes since last visit?  no  Physicians involved in your care Any changes since last visit?  no   Family History  Problem Relation Age of Onset  . Cancer Mother     uterine  . Cancer Maternal Uncle     lung  . Cancer Daughter     breast   History   Social History  . Marital Status: Divorced    Spouse Name: N/A    Number of Children: N/A  . Years of Education: N/A   Occupational History  . Retired/Disability    Social History Main Topics  . Smoking status: Former Smoker -- 0.50 packs/day    Quit date: 01/22/2008  . Smokeless tobacco: Never Used  . Alcohol Use: Yes  . Drug Use: No  . 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 None   Social History Narrative  . None   Past Surgical History  Procedure Laterality Date  . Cervical fusion    . Rotator cuff repair    . Replacement total knee    . Joint replacement    . Tonsillectomy    . Breast lumpectomy  02/02/2012    right  .  Abdominal hysterectomy  1970    has ovaries   Past Medical History  Diagnosis Date  . Rosacea   . High cholesterol   . Low back pain   . Left arm pain   . Neck pain   . Thoracic back pain   . Complication of anesthesia 2006    POST OP HALLUCINATIONS AND CONFUSIION /"MAYBE SOMETHING FOR PAIN "  . GERD (gastroesophageal reflux disease)   . 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 Ear disorder     TINNITIS  . Urine frequency     AND PT GETS UP AT NIGHT  . Dental crowns present   . Cancer     breast- right  . History of radiation therapy 03/22/12-04/19/12    right breast   BP 117/82  Pulse 69  Resp 14  Ht 5\' 4"  (1.626 m)  Wt 139 lb 12.8 oz (63.413 kg)  BMI 23.98 kg/m2  SpO2 96%     Review of  Systems  Musculoskeletal: Positive for back pain.  All other systems reviewed and are negative.       Objective:   Physical Exam  Patient is a well-developed well-nourished woman who does not appear in any distress  She is oriented x3 speech is clear affect is bright she's alert cooperative and pleasant follows commands with difficulty answers questions appropriately  Her reflexes are symmetric and intact in the lower extremities  There are no sensory deficits appreciated in the lower extremities  Motor strength is 5 over 5 at hip flexion knee extension knee flexion dorsiflexion and plantarflexion EHL  Straight leg raise negative  Transitions easily from sit to stand  Gait is nonantalgic symmetric  Heel toe walking normal tandem gait Romberg test normal  Flexion extension of lumbar spine reveals good motion without exacerbation of pain in low back with flexion or extension today.  Some mild tenderness over the trochanters bilaterally and down ITB.Tenderness noted in right pes anserine area.        Assessment & Plan:  1. Ischial bursitis Plan: Icing 3-4 times a day for 10-15 minutes  Use foam pad when sitting  Continued to do lower extremity stretches  Voltaren gel as needed to area to 4 times a day    2. Lumbago with history of facet arthropathy:.improved, not main complaint today  Plan to continue use of gabapentin 100 mg twice a day. Seems to be helping.  Physical therapy completed last month LBP seems better  Hold off on radiofrequency.  3. History of mild degenerative joint disease of hip.   4. Myofascial gluteal muscle dysfunction/ischial bursitis.   5. Left trochanteric bursitis, overall improved status post injection.    6. Status post cervical surgery, Dr. Newell Coral. The patient has  recently followed up with Dr. Yevette Edwards who saw her. A CT scan of the neck was done in August and Dr. Yevette Edwards recommended continued  use of tramadol and Mobic and consideration of  left-sided C5  selective nerve root block should she have worsening pain in this  upper extremity/shoulder area.    Will continue tramadol, will start Ultracet 4 times a day when necessary. We also discussed that she may use an extra strength Tylenol instead of Ultracet if she chooses up to 4 times a day as well. For burning a leg pain and knee pain:  Will continue gabapentin 100 mg twice a day. However continue to use Voltaren gel on a when necessary basis.  She understands the risks and  benefits of both these medications and is trying to minimize use of the meloxicam as much as she can.  Goals are to maintain/improve function, reduce pain.using physical rehabilitation management options.  Minimize use of narcotic pain medication.     F/u in 3 months

## 2013-04-17 NOTE — Patient Instructions (Addendum)
I have discontinued your tramadol  I am starting you on Ultracet which is a combination of tramadol and acetaminophen (Tylenol).  You can take this up to 4 times a day.  F/u in 3 months

## 2013-04-18 ENCOUNTER — Other Ambulatory Visit: Payer: Self-pay | Admitting: Physical Medicine and Rehabilitation

## 2013-04-19 ENCOUNTER — Encounter (INDEPENDENT_AMBULATORY_CARE_PROVIDER_SITE_OTHER): Payer: Self-pay | Admitting: General Surgery

## 2013-05-21 ENCOUNTER — Other Ambulatory Visit: Payer: Self-pay | Admitting: *Deleted

## 2013-05-21 DIAGNOSIS — C50411 Malignant neoplasm of upper-outer quadrant of right female breast: Secondary | ICD-10-CM

## 2013-05-21 MED ORDER — RALOXIFENE HCL 60 MG PO TABS: 60.0000 mg | ORAL_TABLET | Freq: Every day | ORAL | Status: DC

## 2013-06-21 ENCOUNTER — Encounter (INDEPENDENT_AMBULATORY_CARE_PROVIDER_SITE_OTHER): Payer: Self-pay | Admitting: General Surgery

## 2013-06-21 ENCOUNTER — Ambulatory Visit (INDEPENDENT_AMBULATORY_CARE_PROVIDER_SITE_OTHER): Payer: PRIVATE HEALTH INSURANCE | Admitting: General Surgery

## 2013-06-21 VITALS — BP 110/68 | HR 72 | Temp 97.9°F | Resp 14 | Ht 64.0 in | Wt 133.8 lb

## 2013-06-21 DIAGNOSIS — C50411 Malignant neoplasm of upper-outer quadrant of right female breast: Secondary | ICD-10-CM

## 2013-06-21 DIAGNOSIS — C50419 Malignant neoplasm of upper-outer quadrant of unspecified female breast: Secondary | ICD-10-CM

## 2013-06-21 NOTE — Progress Notes (Signed)
HISTORY: Patient is a 72 year old female who is 18 months status post breast conservation treatment for right breast cancer. She had delayed cellulitis versus reaction to radiation. She required steroids.  Since then, she has been doing better.  She continues to have a lot of sensitivity and soreness of her right breast, but no recurrent erythema. She does have issues with getting her bras to fit.  She denies feeling any new masses.    PERTINENT REVIEW OF SYSTEMS: Otherwise negative times 11.   EXAM: Head: Normocephalic and atraumatic.  Eyes:  Conjunctivae are normal. Pupils are equal, round, and reactive to light. No scleral icterus.  Neck:  Normal range of motion. Neck supple. No tracheal deviation present. No thyromegaly present.  Resp: No respiratory distress, normal effort. Breast:  Thickening at scar right breast.  Continues to have skin changes from radiation.  Right breast tender.  No erythema.   Left breast without masses, skin dimpling, nipple retraction.  No axillary, supraclavicular, infraclavicular, or cervical adenopathy Abd:  Abdomen is soft, non distended and non tender. No masses are palpable.  There is no rebound and no guarding.  Neurological: Alert and oriented to person, place, and time. Coordination normal.  Skin: Skin is warm and dry. No rash noted. No diaphoretic. No erythema. No pallor.  Psychiatric: Normal mood and affect. Normal behavior. Judgment and thought content normal.   Mammogram 12/2012 negative for malignancy.     ASSESSMENT AND PLAN:   Cancer of upper-outer quadrant of female breast, Right [id number] No clinical evidence of disease.  Continue raloxifene.  Follow up with me in 6 months.  Will message Dr. Welton Flakes and breast navigator to see when oncology follow up needed.   After next appt, will move to yearly follow up.         Maudry Diego, MD Surgical Oncology, General & Endocrine Surgery [facility]  Ralene Ok,  MD Ralene Ok, MD

## 2013-06-21 NOTE — Assessment & Plan Note (Signed)
No clinical evidence of disease.  Continue raloxifene.  Follow up with me in 6 months.  Will message Dr. Welton Flakes and breast navigator to see when oncology follow up needed.   After next appt, will move to yearly follow up.

## 2013-06-21 NOTE — Patient Instructions (Signed)
Follow up with me in 6 months.    I will send message to Dr. Welton Flakes about follow up.

## 2013-07-08 ENCOUNTER — Encounter: Payer: Self-pay | Admitting: Physical Medicine & Rehabilitation

## 2013-07-08 ENCOUNTER — Encounter: Payer: PRIVATE HEALTH INSURANCE | Attending: Physical Medicine and Rehabilitation

## 2013-07-08 ENCOUNTER — Ambulatory Visit (HOSPITAL_BASED_OUTPATIENT_CLINIC_OR_DEPARTMENT_OTHER): Payer: PRIVATE HEALTH INSURANCE | Admitting: Physical Medicine & Rehabilitation

## 2013-07-08 VITALS — BP 122/83 | HR 66 | Resp 14 | Ht 64.0 in | Wt 131.0 lb

## 2013-07-08 DIAGNOSIS — G8929 Other chronic pain: Secondary | ICD-10-CM | POA: Insufficient documentation

## 2013-07-08 DIAGNOSIS — M25519 Pain in unspecified shoulder: Secondary | ICD-10-CM | POA: Insufficient documentation

## 2013-07-08 DIAGNOSIS — M545 Low back pain, unspecified: Secondary | ICD-10-CM | POA: Insufficient documentation

## 2013-07-08 DIAGNOSIS — IMO0001 Reserved for inherently not codable concepts without codable children: Secondary | ICD-10-CM

## 2013-07-08 DIAGNOSIS — M76899 Other specified enthesopathies of unspecified lower limb, excluding foot: Secondary | ICD-10-CM

## 2013-07-08 DIAGNOSIS — M169 Osteoarthritis of hip, unspecified: Secondary | ICD-10-CM | POA: Insufficient documentation

## 2013-07-08 DIAGNOSIS — Z79899 Other long term (current) drug therapy: Secondary | ICD-10-CM

## 2013-07-08 DIAGNOSIS — M47817 Spondylosis without myelopathy or radiculopathy, lumbosacral region: Secondary | ICD-10-CM

## 2013-07-08 DIAGNOSIS — Z5181 Encounter for therapeutic drug level monitoring: Secondary | ICD-10-CM

## 2013-07-08 DIAGNOSIS — M79609 Pain in unspecified limb: Secondary | ICD-10-CM | POA: Insufficient documentation

## 2013-07-08 DIAGNOSIS — M1610 Unilateral primary osteoarthritis, unspecified hip: Secondary | ICD-10-CM | POA: Insufficient documentation

## 2013-07-08 MED ORDER — DIAZEPAM 10 MG PO TABS: 5.0000 mg | ORAL_TABLET | Freq: Once | ORAL | Status: DC

## 2013-07-08 NOTE — Patient Instructions (Signed)
Medial branch blocks next visit 3 injection's on the left and 3 injection's on the right. If you respond to these we may need to repeat the radiofrequency procedure for a longer lasting results

## 2013-07-08 NOTE — Progress Notes (Signed)
Subjective:    Patient ID: Sabrina Sullivan, female    DOB: [birth year], 73 y.o.   MRN: [id number]  HPI 72 yo woman with h/o breast cancer and chronic low back pain.  She is followed here at the [facility] for  chronic pain complaints, predominantly related to her low back and hip  region. She carries diagnosis of lumbar facet arthropathy, myofascial  gluteal muscle dysfunction. She is status post a right knee replacement  and she also has had problems intermittently with trochanteric bursitis.  Her chief complaint today is not as much low back pain as it is pain in the region of her ischium bilaterally and right lateral hip (trochanteric bursitis) Pain is worse when she's sitting.  There is no new numbness tingling weakness. No new falls or injuries. She's had this problem intermittently to various degrees over a last several years. She tribute status to a fall she had many years ago.   Here today for refill of medications. She uses gabapentin twice a day. She uses tramadol when necessary. As Lidoderm which she uses on her right knee on occasion. Uses Voltaren gel when necessary as well.  Has participated in physical therapy as home exercise program.  Overall fairly active woman.  Pain is worse with prolonged sitting and transitional movements, notes stiffness especially after sitting. Pain Inventory Average Pain 8 Pain Right Now 9 My pain is constant, sharp, burning, tingling and aching  In the last 24 hours, has pain interfered with the following? General activity 8 Relation with others 8 Enjoyment of life 9 What TIME of day is your pain at its worst? morning evening night Sleep (in general) Fair  Pain is worse with: walking, sitting, standing and some activites Pain improves with: rest and heat/ice Relief from Meds: 6  Mobility walk without assistance ability to climb steps?  yes do you drive?  yes  Function disabled: date disabled  .  Neuro/Psych depression anxiety  Prior Studies Any changes since last visit?  no  Physicians involved in your care Any changes since last visit?  no   Family History  Problem Relation Age of Onset  . Cancer Mother     uterine  . Cancer Maternal Uncle     lung  . Cancer Daughter     breast   History   Social History  . Marital Status: Divorced    Spouse Name: N/A    Number of Children: N/A  . Years of Education: N/A   Occupational History  . Retired/Disability    Social History Main Topics  . Smoking status: Former Smoker -- 0.50 packs/day    Quit date: 01/22/2008  . Smokeless tobacco: Never Used  . Alcohol Use: Yes  . Drug Use: No  . 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 None   Social History Narrative  . None   Past Surgical History  Procedure Laterality Date  . Cervical fusion    . Rotator cuff repair    . Replacement total knee    . Joint replacement    . Tonsillectomy    . Breast lumpectomy  02/02/2012    right  . Abdominal hysterectomy  1970    has ovaries   Past Medical History  Diagnosis Date  . Rosacea   . High cholesterol   . Low back pain   . Left arm pain   . Neck pain   .  Thoracic back pain   . Complication of anesthesia 2006    POST OP HALLUCINATIONS AND CONFUSIION /"MAYBE SOMETHING FOR PAIN "  . GERD (gastroesophageal reflux disease)   . 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 Ear disorder     TINNITIS  . Urine frequency     AND PT GETS UP AT NIGHT  . Dental crowns present   . Cancer     breast- right  . History of radiation therapy 03/22/12-04/19/12    right breast   BP 122/83  Pulse 66  Resp 14  Ht 5\' 4"  (1.626 m)  Wt 131 lb (59.421 kg)  BMI 22.47 kg/m2  SpO2 97%    Review of Systems  Gastrointestinal: Positive for abdominal pain.  Musculoskeletal: Positive for back pain.  All other systems reviewed and are negative.       Objective:   Physical  Exam  General no acute distress Mood and affect are appropriate Extremities show no clubbing cyanosis or edema Normal range of motion at the shoulders elbows wrists hips knees and ankles No tenderness palpation in the neck area Positive tenderness to palpation over the PSIS area as well as the initial tuberosities as well as the greater trochanter is as well as the gluteus maximus area. Motor strength is 5/5 in bilateral deltoid, bicep, tricep, grip, hip flexors, knee extensors, ankle dorsiflexors and plantar flexors Deep tendon reflexes are normal in both upper and lower extremities      Assessment & Plan:  1. Ischial bursitis Plan: Icing 3-4 times a day for 10-15 minutes  Use foam pad when sitting  Continued to do lower extremity stretches  Voltaren gel as needed to area to 4 times a day    2. Lumbago with history of facet arthropathy this has been increasing. Her last radiofrequency procedure was 5 years ago. She has numerous other pain complaints, therefore one or 2 sets of medial branch blocks may be helpful in further identifying this as the source of her current complaints.  3. History of mild degenerative joint disease of hip. Good range of motion  4. Myofascial gluteal muscle dysfunction/ischial bursitis.   5. Left trochanteric bursitis, overall improved status post injection.    6. Status post cervical surgery, Dr. Newell Coral. The patient has  recently followed up with Dr. Yevette Edwards who saw her. A CT scan of the neck was done in August and Dr. Yevette Edwards recommended continued  use of tramadol and Mobic and consideration of left-sided C5  selective nerve root block should she have worsening pain in this  upper extremity/shoulder area.   Ultracet 4 times a day when necessary. We also discussed that she may use an extra strength Tylenol instead of Ultracet if she chooses up to 4 times a day as well. For burning a leg pain and knee pain:  Will continue gabapentin 100 mg twice a  day. However continue to use Voltaren gel on a when necessary basis.  She understands the risks and benefits of both these medications,  meloxicam has been used very infrequently  Goals are to maintain/improve function, reduce pain.using physical rehabilitation management options.  Minimize use of narcotic pain medication.   Over half of the 25 min visit was spent counseling and coordinating care.

## 2013-07-19 ENCOUNTER — Other Ambulatory Visit: Payer: Self-pay | Admitting: Internal Medicine

## 2013-07-19 ENCOUNTER — Ambulatory Visit
Admission: RE | Admit: 2013-07-19 | Discharge: 2013-07-19 | Disposition: A | Discharge status: Home / Self Care | Payer: PRIVATE HEALTH INSURANCE | Source: Ambulatory Visit | Attending: Internal Medicine | Admitting: Internal Medicine

## 2013-07-29 ENCOUNTER — Telehealth: Payer: Self-pay

## 2013-07-29 NOTE — Telephone Encounter (Signed)
Patient had questions regarding valium before injections.  Advised patient to take half tablet 30 min before procedure.  Patient understands.

## 2013-07-30 ENCOUNTER — Encounter: Payer: Self-pay | Admitting: Physical Medicine & Rehabilitation

## 2013-07-30 ENCOUNTER — Ambulatory Visit (HOSPITAL_BASED_OUTPATIENT_CLINIC_OR_DEPARTMENT_OTHER): Payer: PRIVATE HEALTH INSURANCE | Admitting: Physical Medicine & Rehabilitation

## 2013-07-30 ENCOUNTER — Encounter: Payer: PRIVATE HEALTH INSURANCE | Attending: Physical Medicine and Rehabilitation

## 2013-07-30 VITALS — HR 84 | Resp 14 | Ht 64.0 in | Wt 129.0 lb

## 2013-07-30 DIAGNOSIS — G8929 Other chronic pain: Secondary | ICD-10-CM | POA: Insufficient documentation

## 2013-07-30 DIAGNOSIS — M79609 Pain in unspecified limb: Secondary | ICD-10-CM | POA: Insufficient documentation

## 2013-07-30 DIAGNOSIS — M545 Low back pain, unspecified: Secondary | ICD-10-CM | POA: Insufficient documentation

## 2013-07-30 DIAGNOSIS — M47816 Spondylosis without myelopathy or radiculopathy, lumbar region: Secondary | ICD-10-CM

## 2013-07-30 DIAGNOSIS — M1610 Unilateral primary osteoarthritis, unspecified hip: Secondary | ICD-10-CM | POA: Insufficient documentation

## 2013-07-30 DIAGNOSIS — M76899 Other specified enthesopathies of unspecified lower limb, excluding foot: Secondary | ICD-10-CM | POA: Insufficient documentation

## 2013-07-30 DIAGNOSIS — M169 Osteoarthritis of hip, unspecified: Secondary | ICD-10-CM | POA: Insufficient documentation

## 2013-07-30 DIAGNOSIS — M25519 Pain in unspecified shoulder: Secondary | ICD-10-CM | POA: Insufficient documentation

## 2013-07-30 DIAGNOSIS — M47817 Spondylosis without myelopathy or radiculopathy, lumbosacral region: Secondary | ICD-10-CM

## 2013-07-30 NOTE — Patient Instructions (Signed)

## 2013-07-30 NOTE — Progress Notes (Signed)
  PROCEDURE RECORD [facility]   Name: Sabrina Sullivan DOB:02-Sep-1941 MRN: [id number]  Date:07/30/2013  Physician: Claudette Laws, MD    Nurse/CMA: Kelli Churn, CMA  Allergies:  Allergies  Allergen Reactions  . Hydrocodone Itching    All over the body  . Morphine   . Morphine And Related Itching    All over the body All over the body    Consent Signed: yes  Is patient diabetic? no    Pregnant: no LMP: No LMP recorded. Patient has had a hysterectomy. (age 3-55)  Anticoagulants: no Anti-inflammatory: no Antibiotics: no  Procedure: Medial branch block  Position: Prone Start Time:  135 End Time:  150 Fluoro Time: 43  RN/CMA Sabrina Sullivan CMA Sabrina Sullivan, CMA    Time 132 154    BP 114/74 122/79    Pulse 84 73    Respirations 14 14    O2 Sat 96 98    S/S 6 6    Pain Level 9/10 0/10     D/C home with Carol-Friend, patient A & O X 3, D/C instructions reviewed, and sits independently.

## 2013-07-30 NOTE — Progress Notes (Signed)

## 2013-08-02 ENCOUNTER — Other Ambulatory Visit: Payer: Self-pay | Admitting: Physical Medicine and Rehabilitation

## 2013-08-09 ENCOUNTER — Encounter: Payer: Self-pay | Admitting: *Deleted

## 2013-08-09 ENCOUNTER — Other Ambulatory Visit: Payer: Self-pay | Admitting: Physical Medicine and Rehabilitation

## 2013-08-09 NOTE — Progress Notes (Signed)
[facility] Advance Directives Clinical Social Work  Clinical Social Work was referred by patient  to review and complete healthcare advance directives. Sabrina Sullivan has previously completed directives with CSW before, but would like to make changes, therefore, will complete a new document. Clinical Child psychotherapist met with patientin CSW office.  The patient designated Darius Bump, daughter, as their primary healthcare agent and Maretta Bees, daughter, as their secondary agent.  Patient also completed healthcare living will.    Clinical Social Worker notarized documents and made copies for patient/family. Clinical Social Worker will send documents to medical records to be scanned into patient's chart. Clinical Social Worker encouraged patient/family to contact with any additional questions or concerns.   Patient currently experiencing family conflicts.  CSW encouraged patient to seek counseling for support, patient agreed.  CSW will contact patient with counseling referral in the community.  [name], MSW, LCSW Clinical Social Worker [facility] [phone]

## 2013-08-26 ENCOUNTER — Encounter: Payer: PRIVATE HEALTH INSURANCE | Attending: Physical Medicine and Rehabilitation

## 2013-08-26 ENCOUNTER — Encounter: Payer: Self-pay | Admitting: Physical Medicine & Rehabilitation

## 2013-08-26 ENCOUNTER — Ambulatory Visit (HOSPITAL_BASED_OUTPATIENT_CLINIC_OR_DEPARTMENT_OTHER): Payer: PRIVATE HEALTH INSURANCE | Admitting: Physical Medicine & Rehabilitation

## 2013-08-26 VITALS — BP 96/62 | HR 70 | Resp 14 | Ht 64.0 in | Wt 129.0 lb

## 2013-08-26 DIAGNOSIS — M47816 Spondylosis without myelopathy or radiculopathy, lumbar region: Secondary | ICD-10-CM

## 2013-08-26 DIAGNOSIS — M1610 Unilateral primary osteoarthritis, unspecified hip: Secondary | ICD-10-CM | POA: Insufficient documentation

## 2013-08-26 DIAGNOSIS — M76899 Other specified enthesopathies of unspecified lower limb, excluding foot: Secondary | ICD-10-CM

## 2013-08-26 DIAGNOSIS — M7061 Trochanteric bursitis, right hip: Secondary | ICD-10-CM

## 2013-08-26 DIAGNOSIS — M169 Osteoarthritis of hip, unspecified: Secondary | ICD-10-CM | POA: Insufficient documentation

## 2013-08-26 DIAGNOSIS — M25519 Pain in unspecified shoulder: Secondary | ICD-10-CM | POA: Insufficient documentation

## 2013-08-26 DIAGNOSIS — M545 Low back pain, unspecified: Secondary | ICD-10-CM | POA: Insufficient documentation

## 2013-08-26 DIAGNOSIS — M47817 Spondylosis without myelopathy or radiculopathy, lumbosacral region: Secondary | ICD-10-CM

## 2013-08-26 DIAGNOSIS — M79609 Pain in unspecified limb: Secondary | ICD-10-CM | POA: Insufficient documentation

## 2013-08-26 DIAGNOSIS — G8929 Other chronic pain: Secondary | ICD-10-CM | POA: Insufficient documentation

## 2013-08-26 MED ORDER — MELOXICAM 7.5 MG PO TABS: 7.5000 mg | ORAL_TABLET | Freq: Every day | ORAL | Status: DC | PRN

## 2013-08-26 MED ORDER — TRAMADOL HCL 50 MG PO TABS: 50.0000 mg | ORAL_TABLET | Freq: Two times a day (BID) | ORAL | Status: DC

## 2013-08-26 NOTE — Progress Notes (Signed)
Subjective:    Patient ID: Sabrina Sullivan, female    DOB: 03-06-1941, 72 y.o.   MRN: [id number]  HPI More sore in back hips and knees since September 9 medial branch blocks. No fevers or chills. Sometimes walks further distances and sometimes is limited by pain. Has had right knee checked out by orthopedics in the past for total knee replacement. Her replacement was x-rayed in no shift.  Difficulty laying on side. Pain wakes her up at night. Right hip hurts more than left. Has responded in the past 2 trochanteric bursa injections.  Also has been on as needed Mobic in the past. This has been helpful. No GI upset currently. Pain Inventory Average Pain 9 Pain Right Now 9 My pain is constant, burning, dull, tingling and aching  In the last 24 hours, has pain interfered with the following? General activity 9 Relation with others 9 Enjoyment of life 10 What TIME of day is your pain at its worst? evening and night Sleep (in general) Fair  Pain is worse with: walking, sitting, standing and some activites Pain improves with: rest Relief from Meds: 5  Mobility how many minutes can you walk? varies ability to climb steps?  yes do you drive?  yes Do you have any goals in this area?  no  Function retired  Neuro/Psych bowel control problems  Prior Studies Any changes since last visit?  no  Physicians involved in your care Any changes since last visit?  no   Family History  Problem Relation Age of Onset  . Cancer Mother     uterine  . Cancer Maternal Uncle     lung  . Cancer Daughter     breast   History   Social History  . Marital Status: Divorced    Spouse Name: N/A    Number of Children: N/A  . Years of Education: N/A   Occupational History  . Retired/Disability    Social History Main Topics  . Smoking status: Former Smoker -- 0.50 packs/day    Quit date: 01/22/2008  . Smokeless tobacco: Never Used  . Alcohol Use: Yes  . Drug Use: No  . 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 None   Social History Narrative  . None   Past Surgical History  Procedure Laterality Date  . Cervical fusion    . Rotator cuff repair    . Replacement total knee    . Joint replacement    . Tonsillectomy    . Breast lumpectomy  02/02/2012    right  . Abdominal hysterectomy  1970    has ovaries   Past Medical History  Diagnosis Date  . Rosacea   . High cholesterol   . Low back pain   . Left arm pain   . Neck pain   . Thoracic back pain   . Complication of anesthesia 2006    POST OP HALLUCINATIONS AND CONFUSIION /"MAYBE SOMETHING FOR PAIN "  . GERD (gastroesophageal reflux disease)   . 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 Ear disorder     TINNITIS  . Urine frequency     AND PT GETS UP AT NIGHT  . Dental crowns present   . Cancer     breast- right  . History of radiation therapy 03/22/12-04/19/12    right breast   BP 96/62  Pulse 70  Resp 14  Ht 5\' 4"  (  1.626 m)  Wt 129 lb (58.514 kg)  BMI 22.13 kg/m2  SpO2 99%     Review of Systems  Gastrointestinal: Positive for constipation.  Musculoskeletal: Positive for back pain.  All other systems reviewed and are negative.       Objective:   Physical Exam  Tenderness over the right greater than left trochanteric bursa Hip range of motion is pain-free. Lumbar spine mild tenderness paraspinals. No pain with flexion. Pain with extension. No pain with lateral bending. Lower extremity strength is normal Deep tendon reflexes are normal.      Assessment & Plan:  1. Trochanteric bursitis) the left will repeat trochanteric bursa injection  2. Lumbar spondylosis lateral bending has improved after injections but nothing else. May try as needed Mobic on days when the pain is worse. Also low dose tramadol which is not tolerated well because of constipation. Discussed trying Senokot S. Which combines stool softener L. As well as a  stimulant  Trochanteric bursa injection  without ultrasound guidance  Indication Trochanteric bursitis. Exam has tenderness over the greater trochanter of the hip. Pain has not responded to conservative care such as exercise therapy and oral medications. Pain interferes with sleep or with mobility Informed consent was obtained after describing risks and benefits of the procedure with the patient these include bleeding bruising and infection. Patient has signed written consent form. Patient placed in a lateral decubitus position with the affected hip superior. Point of maximal pain was palpated marked and prepped with Betadine and entered with a needle to bone contact. Needle slightly withdrawn then 6mg  of betamethasone with 4 cc 1% lidocaine were injected. Patient tolerated procedure well. Post procedure instructions given.

## 2013-08-26 NOTE — Patient Instructions (Signed)
Try Senokot S1 or 2 tablets twice a day for constipation  Mobic 7.5 mg use only when pain is more severe, not everyday, take with food  Today we will do a trochanteric bursa injection on the right side.  Refills on tramadol

## 2013-09-08 ENCOUNTER — Other Ambulatory Visit: Payer: Self-pay | Admitting: Physical Medicine and Rehabilitation

## 2013-09-17 ENCOUNTER — Ambulatory Visit: Payer: PRIVATE HEALTH INSURANCE | Admitting: Physical Therapy

## 2013-09-19 ENCOUNTER — Other Ambulatory Visit: Payer: Self-pay | Admitting: Gastroenterology

## 2013-09-19 DIAGNOSIS — R109 Unspecified abdominal pain: Secondary | ICD-10-CM

## 2013-09-25 ENCOUNTER — Ambulatory Visit
Admission: RE | Admit: 2013-09-25 | Discharge: 2013-09-25 | Disposition: A | Discharge status: Home / Self Care | Payer: 59 | Source: Ambulatory Visit | Attending: Gastroenterology | Admitting: Gastroenterology

## 2013-09-25 DIAGNOSIS — R109 Unspecified abdominal pain: Secondary | ICD-10-CM

## 2013-09-25 MED ORDER — IOHEXOL 300 MG/ML  SOLN
100.0000 mL | Freq: Once | INTRAMUSCULAR | Status: AC | PRN
  Administered 2013-09-25: 100 mL via INTRAVENOUS

## 2013-10-07 ENCOUNTER — Other Ambulatory Visit: Payer: Self-pay | Admitting: Physical Medicine and Rehabilitation

## 2013-10-21 ENCOUNTER — Encounter: Payer: Self-pay | Admitting: Physical Medicine & Rehabilitation

## 2013-10-21 ENCOUNTER — Encounter: Payer: PRIVATE HEALTH INSURANCE | Attending: Physical Medicine and Rehabilitation

## 2013-10-21 ENCOUNTER — Ambulatory Visit (HOSPITAL_BASED_OUTPATIENT_CLINIC_OR_DEPARTMENT_OTHER): Payer: PRIVATE HEALTH INSURANCE | Admitting: Physical Medicine & Rehabilitation

## 2013-10-21 VITALS — BP 108/71 | HR 76 | Resp 14 | Ht 64.0 in | Wt 126.6 lb

## 2013-10-21 DIAGNOSIS — G5700 Lesion of sciatic nerve, unspecified lower limb: Secondary | ICD-10-CM

## 2013-10-21 DIAGNOSIS — G8929 Other chronic pain: Secondary | ICD-10-CM | POA: Insufficient documentation

## 2013-10-21 DIAGNOSIS — M25519 Pain in unspecified shoulder: Secondary | ICD-10-CM | POA: Insufficient documentation

## 2013-10-21 DIAGNOSIS — M545 Low back pain, unspecified: Secondary | ICD-10-CM | POA: Insufficient documentation

## 2013-10-21 DIAGNOSIS — M79609 Pain in unspecified limb: Secondary | ICD-10-CM | POA: Insufficient documentation

## 2013-10-21 DIAGNOSIS — M76899 Other specified enthesopathies of unspecified lower limb, excluding foot: Secondary | ICD-10-CM | POA: Insufficient documentation

## 2013-10-21 DIAGNOSIS — G5702 Lesion of sciatic nerve, left lower limb: Secondary | ICD-10-CM

## 2013-10-21 DIAGNOSIS — M1610 Unilateral primary osteoarthritis, unspecified hip: Secondary | ICD-10-CM | POA: Insufficient documentation

## 2013-10-21 DIAGNOSIS — M169 Osteoarthritis of hip, unspecified: Secondary | ICD-10-CM | POA: Insufficient documentation

## 2013-10-21 MED ORDER — TIZANIDINE HCL 4 MG PO TABS: 4.0000 mg | ORAL_TABLET | Freq: Every day | ORAL | Status: DC

## 2013-10-21 NOTE — Progress Notes (Addendum)
Subjective:    Patient ID: Sabrina Sullivan, female    DOB: [birth year], 72 y.o.   MRN: [id number]  HPI No benefit from September 9 medial branch blocks.   Sometimes walks further distances and sometimes is limited by pain.   Difficulty laying on side. Pain wakes her up at night. Left box pain. Feels like it is a bone.Also has been on as needed Mobic in the past. This has been helpful. No GI upset currently.  Pain Inventory Average Pain 8 Pain Right Now 9 My pain is burning, dull, tingling and aching  In the last 24 hours, has pain interfered with the following? General activity 8 Relation with others 8 Enjoyment of life 9 What TIME of day is your pain at its worst? day evening and night Sleep (in general) Poor  Pain is worse with: walking, sitting, standing and some activites Pain improves with: rest and heat/ice Relief from Meds: 5  Mobility walk without assistance ability to climb steps?  yes do you drive?  yes  Function disabled: date disabled .  Neuro/Psych No problems in this area  Prior Studies Any changes since last visit?  no  Physicians involved in your care Any changes since last visit?  no   Family History  Problem Relation Age of Onset  . Cancer Mother     uterine  . Cancer Maternal Uncle     lung  . Cancer Daughter     breast   History   Social History  . Marital Status: Divorced    Spouse Name: N/A    Number of Children: N/A  . Years of Education: N/A   Occupational History  . Retired/Disability    Social History Main Topics  . Smoking status: Former Smoker -- 0.50 packs/day    Quit date: 01/22/2008  . Smokeless tobacco: Never Used  . Alcohol Use: Yes  . Drug Use: No  . 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 None   Social History Narrative  . None   Past Surgical History  Procedure Laterality Date  . Cervical fusion    . Rotator cuff repair    . Replacement total knee    .  Joint replacement    . Tonsillectomy    . Breast lumpectomy  02/02/2012    right  . Abdominal hysterectomy  1970    has ovaries   Past Medical History  Diagnosis Date  . Rosacea   . High cholesterol   . Low back pain   . Left arm pain   . Neck pain   . Thoracic back pain   . Complication of anesthesia 2006    POST OP HALLUCINATIONS AND CONFUSIION /"MAYBE SOMETHING FOR PAIN "  . GERD (gastroesophageal reflux disease)   . 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 Ear disorder     TINNITIS  . Urine frequency     AND PT GETS UP AT NIGHT  . Dental crowns present   . Cancer     breast- right  . History of radiation therapy 03/22/12-04/19/12    right breast   BP 108/71  Pulse 76  Resp 14  Ht 5\' 4"  (1.626 m)  Wt 126 lb 9.6 oz (57.425 kg)  BMI 21.72 kg/m2  SpO2 95%     Review of Systems  Musculoskeletal: Positive for back pain.  All other systems reviewed and are negative.  Objective:   Physical Exam  No tenderness over the greater trochanter Good hip range of motion. Lumbar range of motion is good. No pain with lumbar range of motion No tenderness in the lumbar paraspinals. Tenderness over the midway point between PSIS and greater trochanter.  Motor strength is 5/5 bilateral upper and lower extremities Ambulation is normal      Assessment & Plan:  1. 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 She does have some paresthesias in the legs as well. Post set her up for ultrasound guided left piriformis injection with lidocaine and Celestone. Have given her stretches and strengthening exercises for this. Discussed diagnosis. Used a anatomic model  Switched from methocarbamol to Zanaflex 4 mg each bedtime because of pharmacy formulary

## 2013-10-21 NOTE — Patient Instructions (Signed)
Piriformis Syndrome with Rehab Piriformis syndrome is a condition the affects the nervous system in the area of the hip, and is characterized by pain and possibly a loss of feeling in the backside (posterior) thigh that may extend down the entire length of the leg. The symptoms are caused by an increase in pressure on the sciatic nerve by the piriformis muscle, which is on the back of the hip and is responsible for externally rotating the hip. The sciatic nerve and its branches connect to much of the leg. Normally the sciatic nerve runs between the piriformis muscle and other muscles. However, in certain individuals the nerve runs through the muscle, which causes an increase in pressure on the nerve and results in the symptoms of piriformis syndrome. SYMPTOMS   Pain, tingling, numbness, or burning in the back of the thigh that may also extend down the entire leg.  Occasionally, tenderness in the buttock.  Loss of function of the leg.  Pain that worsens when using the piriformis muscle (running, jumping, or stairs).  Pain that increases with prolonged sitting.  Pain that is lessened by laying flat on the back. CAUSES   Piriformis syndrome is the result of an increase in pressure placed on the sciatic nerve. Often times piriformis syndrome is an overuse injury.  Stress placed on the nerve from a sudden increase in the intensity, frequency, or duration of training.  Compensation of other extremity injuries. RISK INCREASES WITH:  Sports that involve the piriformis muscle (running, walking or jumping).  You are born with (congenital) a defect in which the sciatic nerve passes through the muscle. PREVENTION  Warm up and stretch properly before activity.  Allow for adequate recovery between workouts.  Maintain physical fitness:  Strength, flexibility, and endurance.  Cardiovascular fitness. PROGNOSIS  If treated properly, then the symptoms of piriformis syndrome usually resolve in 2  to 6 weeks. RELATED COMPLICATIONS   Persistent and possibly permanent pain and numbness in the lower extremity.  Weakness of the extremity that may progress to disability and inability to compete. TREATMENT  The most effective treatment for piriformis syndrome is rest from any activities that aggravate the symptoms. Ice and pain medication may help reduce pain and inflammation. The use of strengthening and stretching exercises may help reduce pain with activity. These exercises may be performed at home or with a therapist. A referral to a therapist may be given for further evaluation and treatment, such as ultrasound. Corticosteroid injections may be given to reduce inflammation that is causing pressure to be placed on the sciatic nerve. If non-surgical (conservative) treatment is unsuccessful, then surgery may be recommended.  MEDICATION   If pain medication is necessary, then nonsteroidal anti-inflammatory medications, such as aspirin and ibuprofen, or other minor pain relievers, such as acetaminophen, are often recommended.  Do not take pain medication for 7 days before surgery.  Prescription pain relievers may be given if deemed necessary by your caregiver. Use only as directed and only as much as you need.  Corticosteroid injections may be given by your caregiver. These injections should be reserved for the most serious cases, because they may only be given a certain number of times. HEAT AND COLD:   Cold treatment (icing) relieves pain and reduces inflammation. Cold treatment should be applied for 10 to 15 minutes every 2 to 3 hours for inflammation and pain and immediately after any activity that aggravates your symptoms. Use ice packs or massage the area with a piece of ice (ice massage).    Heat treatment may be used prior to performing the stretching and strengthening activities prescribed by your caregiver, physical therapist, or athletic trainer. Use a heat pack or soak the injury in  warm water. SEEK IMMEDIATE MEDICAL CARE IF:  Treatment seems to offer no benefit, or the condition worsens.  Any medications produce adverse side effects. EXERCISES RANGE OF MOTION (ROM) AND STRETCHING EXERCISES - Piriformis Syndrome These exercises may help you when beginning to rehabilitate your injury. Your symptoms may resolve with or without further involvement from your physician, physical therapist or athletic trainer. While completing these exercises, remember:   Restoring tissue flexibility helps normal motion to return to the joints. This allows healthier, less painful movement and activity.  An effective stretch should be held for at least 30 seconds.  A stretch should never be painful. You should only feel a gentle lengthening or release in the stretched tissue. STRETCH - Hip Rotators  Lie on your back on a firm surface. Grasp your right / left knee with your right / left hand and your ankle with your opposite hand.  Keeping your hips and shoulders firmly planted, gently pull your right / left knee and rotate your lower leg toward your opposite shoulder until you feel a stretch in your buttocks.  Hold this stretch for __________ seconds. Repeat this stretch __________ times. Complete this stretch __________ times per day. STRETCH  Iliotibial Band  On the floor or bed, lie on your side so your right / left leg is on top. Bend your knee and grab your ankle.  Slowly bring your knee back so that your thigh is in line with your trunk. Keep your heel at your buttocks and gently arch your back so your head, shoulders and hips line up.  Slowly lower your leg so that your knee approaches the floor/bed until you feel a gentle stretch on the outside of your right / left thigh. If you do not feel a stretch and your knee will not fall farther, place the heel of your opposite foot on top of your knee and pull your thigh down farther.  Hold this stretch for __________ seconds. Repeat  __________ times. Complete __________ times per day. STRENGTHENING EXERCISES - Piriformis Syndrome  These are some of the caregiver again or until your symptoms are resolved. Remember:   Strong muscles with good endurance tolerate stress better.  Do the exercises as initially prescribed by your caregiver. Progress slowly with each exercise, gradually increasing the number of repetitions and weight used under their guidance. STRENGTH - Hip Abductors, Straight Leg Raises Be aware of your form throughout the entire exercise so that you exercise the correct muscles. Sloppy form means that you are not strengthening the correct muscles.  Lie on your side so that your head, shoulders, knee and hip line up. You may bend your lower knee to help maintain your balance. Your right / left leg should be on top.  Roll your hips slightly forward, so that your hips are stacked directly over each other and your right / left knee is facing forward.  Lift your top leg up 4-6 inches, leading with your heel. Be sure that your foot does not drift forward or that your knee does not roll toward the ceiling.  Hold this position for __________ seconds. You should feel the muscles in your outer hip lifting (you may not notice this until your leg begins to tire).  Slowly lower your leg to the starting position. Allow the muscles to   fully relax before beginning the next repetition. Repeat __________ times. Complete this exercise __________ times per day.  STRENGTH - Hip Abductors, Quadriped  On a firm, lightly padded surface, position yourself on your hands and knees. Your hands should be directly below your shoulders and your knees should be directly below your hips.  Keeping your right / left knee bent, lift your leg out to the side. Keep your legs level and in line with your shoulders.  Position yourself on your hands and knees.  Hold for __________ seconds.  Keeping your trunk steady and your hips level, slowly  lower your leg to the starting position. Repeat __________ times. Complete this exercise __________ times per day.  STRENGTH - Hip Abductors, Standing  Tie one end of a rubber exercise band/tubing to a secure surface (table, pole) and tie a loop at the other end.  Place the loop around your right / left ankle. Keeping your ankle with the band directly opposite of the secured end, step away until there is tension in the tube/band.  Hold onto a chair as needed for balance.  Keeping your back upright, your shoulders over your hips, and your toes pointing forward, lift your right / left leg out to your side. Be sure to lift your leg with your hip muscles. Do not "throw" your leg or tip your body to lift your leg.  Slowly and with control, return to the starting position. Repeat exercise __________ times. Complete this exercise __________ times per day.  Document Released: 11/07/2005 Document Revised: 05/08/2012 Document Reviewed: 02/19/2009 [employer] Patient Information 2014 [employer].  

## 2013-10-24 ENCOUNTER — Other Ambulatory Visit (HOSPITAL_BASED_OUTPATIENT_CLINIC_OR_DEPARTMENT_OTHER): Payer: PRIVATE HEALTH INSURANCE | Admitting: Lab

## 2013-10-24 ENCOUNTER — Ambulatory Visit (HOSPITAL_BASED_OUTPATIENT_CLINIC_OR_DEPARTMENT_OTHER): Payer: PRIVATE HEALTH INSURANCE | Admitting: Oncology

## 2013-10-24 ENCOUNTER — Telehealth: Payer: Self-pay | Admitting: Oncology

## 2013-10-24 ENCOUNTER — Encounter: Payer: Self-pay | Admitting: Oncology

## 2013-10-24 VITALS — BP 123/86 | HR 79 | Temp 97.9°F | Resp 18 | Ht 64.0 in | Wt 127.6 lb

## 2013-10-24 DIAGNOSIS — Z170 Estrogen receptor positive status [ER+]: Secondary | ICD-10-CM

## 2013-10-24 DIAGNOSIS — C50419 Malignant neoplasm of upper-outer quadrant of unspecified female breast: Secondary | ICD-10-CM

## 2013-10-24 DIAGNOSIS — C50411 Malignant neoplasm of upper-outer quadrant of right female breast: Secondary | ICD-10-CM

## 2013-10-24 LAB — COMPREHENSIVE METABOLIC PANEL (CC13)
AST: 23 U/L (ref 5–34)
Albumin: 4 g/dL (ref 3.5–5.0)
Alkaline Phosphatase: 51 U/L (ref 40–150)
BUN: 22.3 mg/dL (ref 7.0–26.0)
Chloride: 108 mEq/L (ref 98–109)
Creatinine: 0.9 mg/dL (ref 0.6–1.1)
Potassium: 4.5 mEq/L (ref 3.5–5.1)
Sodium: 144 mEq/L (ref 136–145)

## 2013-10-24 LAB — CBC WITH DIFFERENTIAL/PLATELET
Basophils Absolute: 0 10*3/uL (ref 0.0–0.1)
EOS%: 1.9 % (ref 0.0–7.0)
HCT: 43.5 % (ref 34.8–46.6)
HGB: 14.2 g/dL (ref 11.6–15.9)
MCH: 31.9 pg (ref 25.1–34.0)
MCHC: 32.6 g/dL (ref 31.5–36.0)
MCV: 97.8 fL (ref 79.5–101.0)
MONO%: 9.5 % (ref 0.0–14.0)
NEUT%: 62.4 % (ref 38.4–76.8)
RDW: 13.6 % (ref 11.2–14.5)

## 2013-10-24 NOTE — Progress Notes (Signed)
OFFICE PROGRESS NOTE  CC  MOREIRA,ROY, MD [address] [place] [address] Dr. Almond Lint Dr. 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 She underwent a partial mastectomy with sentinel node procedure. 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 2 sentinel nodes were negative for metastatic disease. Tumor was ER +65% PR receptor negative HER-2/neu negative proliferation marker 12%.  #2 patient is now status post radiation therapy to the right breast administered by Dr. Antony Blackbird. She completed on 04/26/2012.  #3 patient has declined any antiestrogen therapy adjuvantly.  CURRENT THERAPY:Observation  INTERVAL HISTORY: Sabrina Sullivan 72 y.o. female returns for Followup visit today. From oncology perspective patient is doing well. She has no evidence of recurrent disease. 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 She is seen in the pain clinic for this. She denies any fevers chills night sweats headaches shortness of breath chest pains palpitations no myalgias and arthralgias. She does tell me that her surgical site is still touchy. She occasionally does get sharp shooting pains. Remainder of the 10 point review of systems is negative  MEDICAL HISTORY: Past Medical History  Diagnosis Date  . Rosacea   . High cholesterol   . Low back pain   . Left arm pain   . Neck pain   . Thoracic back pain   . Complication of anesthesia 2006    POST OP HALLUCINATIONS AND CONFUSIION /"MAYBE SOMETHING FOR PAIN "  . GERD (gastroesophageal reflux disease)   . 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 Ear disorder     TINNITIS  . Urine frequency     AND PT GETS UP AT  NIGHT  . Dental crowns present   . Cancer     breast- right  . History of radiation therapy 03/22/12-04/19/12    right breast    ALLERGIES:  is allergic to hydrocodone; morphine; and morphine and related.  MEDICATIONS:  Current Outpatient Prescriptions  Medication Sig Dispense Refill  . ALPRAZolam (XANAX) 0.5 MG tablet Take 0.5 mg by mouth 2 (two) times daily.      Marland Kitchen aluminum hydroxide-magnesium carbonate (GAVISCON) 95-358 MG/15ML SUSP Take by mouth. Take 30 mL by mouth Four (4) times daily after meals and at bedtime.      . calcium citrate (CALCITRATE - DOSED IN MG ELEMENTAL CALCIUM) 950 MG tablet Take 1 tablet by mouth daily.      . cholecalciferol (VITAMIN D) 1000 UNITS tablet Take 2,000 Units by mouth daily.       . diclofenac sodium (VOLTAREN) 1 % GEL Apply 1 application topically daily as needed. For inflammation on knee      . doxycycline (VIBRAMYCIN) 50 MG capsule Take 50 mg by mouth every other day. FOR ROSACEA      . esomeprazole (NEXIUM) 40 MG capsule Take 40 mg by mouth daily as needed.       . fish oil-omega-3 fatty acids 1000 MG capsule Take 2 g by mouth daily.       Marland Kitchen gabapentin (NEURONTIN) 100 MG capsule TAKE 1 CAPSULE BY MOUTH THREE TIMES DAILY  90 capsule  0  . Magnesium 500 MG CAPS Take 1,000 mg by mouth daily.      Marland Kitchen  meloxicam (MOBIC) 7.5 MG tablet Take 1 tablet (7.5 mg total) by mouth daily as needed. For pain  30 tablet  1  . omeprazole (PRILOSEC) 20 MG capsule       . pravastatin (PRAVACHOL) 20 MG tablet Take 20 mg by mouth daily.      . Probiotic Product (PROBIOTIC FORMULA PO) Take 1 each by mouth daily.      . raloxifene (EVISTA) 60 MG tablet Take 1 tablet (60 mg total) by mouth daily.  90 tablet  1  . tiZANidine (ZANAFLEX) 4 MG tablet Take 1 tablet (4 mg total) by mouth at bedtime.  30 tablet  3  . traMADol (ULTRAM) 50 MG tablet Take 1 tablet (50 mg total) by mouth 2 (two) times daily.  60 tablet  5  . traMADol (ULTRAM) 50 MG tablet Take 1 tablet (50 mg total) by  mouth 2 (two) times daily.  60 tablet  5  . vitamin B-12 (CYANOCOBALAMIN) 1000 MCG tablet Take 1,000 mcg by mouth daily.       No current facility-administered medications for this visit.    SURGICAL HISTORY:  Past Surgical History  Procedure Laterality Date  . Cervical fusion    . Rotator cuff repair    . Replacement total knee    . Joint replacement    . Tonsillectomy    . Breast lumpectomy  02/02/2012    right  . Abdominal hysterectomy  1970    has ovaries    REVIEW OF SYSTEMS:  Pertinent items are noted in HPI.   PHYSICAL EXAMINATION: General appearance: alert, cooperative and appears stated age Neck: no adenopathy, no carotid bruit, no JVD, supple, symmetrical, trachea midline and thyroid not enlarged, symmetric, no tenderness/mass/nodules Lymph nodes: Cervical, supraclavicular, and axillary nodes normal. Resp: clear to auscultation bilaterally and normal percussion bilaterally Back: symmetric, no curvature. ROM normal. No CVA tenderness. Cardio: regular rate and rhythm, S1, S2 normal, no murmur, click, rub or gallop GI: soft, non-tender; bowel sounds normal; no masses,  no organomegaly Extremities: extremities normal, atraumatic, no cyanosis or edema Neurologic: Grossly normal Bilateral breast examination left breast no masses nipple discharge no skin changes. Right breast reveals a well-healed incisional scar no masses no nodularity no nipple discharge or inversion. No other rashes. ECOG PERFORMANCE STATUS: 0 - Asymptomatic  Blood pressure 123/86, pulse 79, temperature 97.9 F (36.6 C), temperature source Oral, resp. rate 18, height 5\' 4"  (1.626 m), weight 127 lb 9.6 oz (57.879 kg).  LABORATORY DATA: Lab Results  Component Value Date   WBC 4.5 10/24/2013   HGB 14.2 10/24/2013   HCT 43.5 10/24/2013   MCV 97.8 10/24/2013   PLT 121* 10/24/2013      Chemistry      Component Value Date/Time   NA 142 10/24/2012 1255   NA 136 06/16/2012 1200   K 4.6 10/24/2012 1255   K 4.4  06/16/2012 1200   CL 106 10/24/2012 1255   CL 101 06/16/2012 1200   CO2 25 10/24/2012 1255   CO2 23 06/16/2012 1200   BUN 15.0 10/24/2012 1255   BUN 20 06/16/2012 1200   CREATININE 1.2* 10/24/2012 1255   CREATININE 0.98 06/16/2012 1200      Component Value Date/Time   CALCIUM 9.6 10/24/2012 1255   CALCIUM 9.9 06/16/2012 1200   ALKPHOS 61 10/24/2012 1255   ALKPHOS 47 01/04/2012 1149   AST 24 10/24/2012 1255   AST 20 01/04/2012 1149   ALT 18 10/24/2012 1255   ALT 13  01/04/2012 1149   BILITOT 0.39 10/24/2012 1255   BILITOT 0.4 01/04/2012 1149       RADIOGRAPHIC STUDIES:  No results found.  ASSESSMENT: 72 year old female with stage I invasive ductal carcinoma that was low grade ER positive PR negative HER-2/neu negative. Patient is status post partial mastectomy with sentinel node biopsy. The final pathology did show a 0.7 cm tumor 2 sentinel nodes were negative. She is now status post radiation therapy adjuvantly. Patient is currently taking Evista. But she is not on any other type of therapy. Is no evidence of recurrent disease.  PLAN:   #1 I will continue to see her once a year. Patient is also seeing Dr. Everardo Beals. She will also see her once a year and we will plan on staggering our appointments so that she is seeing one of [place] every 6 months.     All questions were answered. The patient knows to call the clinic with any problems, questions or concerns. We can certainly see the patient much sooner if necessary.  I spent 15 minutes counseling the patient face to face. The total time spent in the appointment was 30 minutes.    Drue Second, MD Medical/Oncology [facility] [phone] (beeper) [phone] (Office)  10/24/2013, 12:35 PM

## 2013-11-04 ENCOUNTER — Other Ambulatory Visit: Payer: Self-pay

## 2013-11-04 MED ORDER — GABAPENTIN 100 MG PO CAPS: ORAL_CAPSULE | ORAL | Status: DC

## 2013-11-15 ENCOUNTER — Other Ambulatory Visit: Payer: Self-pay | Admitting: *Deleted

## 2013-11-15 DIAGNOSIS — C50411 Malignant neoplasm of upper-outer quadrant of right female breast: Secondary | ICD-10-CM

## 2013-11-15 MED ORDER — RALOXIFENE HCL 60 MG PO TABS: 60.0000 mg | ORAL_TABLET | Freq: Every day | ORAL | Status: AC

## 2013-11-22 ENCOUNTER — Ambulatory Visit (INDEPENDENT_AMBULATORY_CARE_PROVIDER_SITE_OTHER): Payer: PRIVATE HEALTH INSURANCE | Admitting: General Surgery

## 2013-11-26 ENCOUNTER — Encounter: Payer: Self-pay | Admitting: Physical Medicine & Rehabilitation

## 2013-11-26 ENCOUNTER — Ambulatory Visit (HOSPITAL_BASED_OUTPATIENT_CLINIC_OR_DEPARTMENT_OTHER): Payer: PRIVATE HEALTH INSURANCE | Admitting: Physical Medicine & Rehabilitation

## 2013-11-26 ENCOUNTER — Encounter: Payer: PRIVATE HEALTH INSURANCE | Attending: Physical Medicine and Rehabilitation

## 2013-11-26 VITALS — BP 103/63 | HR 72 | Resp 14 | Ht 64.0 in | Wt 127.0 lb

## 2013-11-26 DIAGNOSIS — G5700 Lesion of sciatic nerve, unspecified lower limb: Secondary | ICD-10-CM

## 2013-11-26 DIAGNOSIS — M545 Low back pain, unspecified: Secondary | ICD-10-CM | POA: Insufficient documentation

## 2013-11-26 DIAGNOSIS — M79609 Pain in unspecified limb: Secondary | ICD-10-CM | POA: Insufficient documentation

## 2013-11-26 DIAGNOSIS — G8929 Other chronic pain: Secondary | ICD-10-CM | POA: Insufficient documentation

## 2013-11-26 DIAGNOSIS — M76899 Other specified enthesopathies of unspecified lower limb, excluding foot: Secondary | ICD-10-CM | POA: Insufficient documentation

## 2013-11-26 DIAGNOSIS — G5702 Lesion of sciatic nerve, left lower limb: Secondary | ICD-10-CM

## 2013-11-26 DIAGNOSIS — M25519 Pain in unspecified shoulder: Secondary | ICD-10-CM | POA: Insufficient documentation

## 2013-11-26 DIAGNOSIS — M169 Osteoarthritis of hip, unspecified: Secondary | ICD-10-CM | POA: Insufficient documentation

## 2013-11-26 DIAGNOSIS — M1610 Unilateral primary osteoarthritis, unspecified hip: Secondary | ICD-10-CM | POA: Insufficient documentation

## 2013-11-26 NOTE — Progress Notes (Signed)
Subjective:    Patient ID: Sabrina Sullivan, female    DOB: 11-May-1941, 73 y.o.   MRN: [id number]  HPI  Pain Inventory Average Pain 9 Pain Right Now 9 My pain is constant, sharp, burning, dull, stabbing, tingling and aching  In the last 24 hours, has pain interfered with the following? General activity 8 Relation with others 8 Enjoyment of life 9 What TIME of day is your pain at its worst? evening Sleep (in general) Poor  Pain is worse with: walking, bending, sitting, standing and some activites Pain improves with: rest and heat/ice Relief from Meds: 5  Mobility ability to climb steps?  yes do you drive?  yes Do you have any goals in this area?  no  Function retired Do you have any goals in this area?  no  Neuro/Psych No problems in this area  Prior Studies Any changes since last visit?  no  Physicians involved in your care Any changes since last visit?  no   Family History  Problem Relation Age of Onset  . Cancer Mother     uterine  . Cancer Maternal Uncle     lung  . Cancer Daughter     breast   History   Social History  . Marital Status: Divorced    Spouse Name: N/A    Number of Children: N/A  . Years of Education: N/A   Occupational History  . Retired/Disability    Social History Main Topics  . Smoking status: Former Smoker -- 0.50 packs/day    Quit date: 01/22/2008  . Smokeless tobacco: Never Used  . Alcohol Use: Yes  . Drug Use: No  . 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 None   Social History Narrative  . None   Past Surgical History  Procedure Laterality Date  . Cervical fusion    . Rotator cuff repair    . Replacement total knee    . Joint replacement    . Tonsillectomy    . Breast lumpectomy  02/02/2012    right  . Abdominal hysterectomy  1970    has ovaries   Past Medical History  Diagnosis Date  . Rosacea   . High cholesterol   . Low back pain   . Left arm pain   .  Neck pain   . Thoracic back pain   . Complication of anesthesia 2006    POST OP HALLUCINATIONS AND CONFUSIION /"MAYBE SOMETHING FOR PAIN "  . GERD (gastroesophageal reflux disease)   . 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 Ear disorder     TINNITIS  . Urine frequency     AND PT GETS UP AT NIGHT  . Dental crowns present   . Cancer     breast- right  . History of radiation therapy 03/22/12-04/19/12    right breast   BP 103/63  Pulse 72  Resp 14  Ht _0  (1.626 m)  Wt 127 lb (57.607 kg)  BMI 21.79 kg/m2  SpO2 99%     Review of Systems  Musculoskeletal: Positive for back pain.  All other systems reviewed and are negative.       Objective:   Physical Exam        Assessment & Plan:  Left piriformis injection under ultrasound guidance  Indication left piriformis syndrome which has not responded to physical therapy or medication management  Informed consent was obtained  after the patient reviewed and anatomy and potential risks as well as potential benefits. She  has given written consent Patient placed prone on exam table area pre-scan and with curvilinear 4 Hz transducer starting at the PSIS then identifying the PI IS and then orienting transducer along the long axis of the piriformis muscle. Area was marked and prepped with Betadine and draped in a sterile manner. 3 cc of lidocaine were infiltrate into the skin and subcutaneous tissue using a 25-gauge 1.5 inch needle. Then a 80 mm echo block needle was inserted under ultrasound guidance targeting the piriformis muscle. Needle tip entered piriformis muscle then a solution containing one ML of Celestone 6 mg +4 ML of 1% lidocaine was injected. Patient tolerated procedure well

## 2013-11-26 NOTE — Patient Instructions (Signed)
Left piriformis injection Lidocaine  Will make the area numb for several hours Celestone is and anti-inflammatory and will take effect tomorrow  Return to clinic one month Will reexamine and review effects of injection. Decide if any further testing is needed.

## 2013-12-11 ENCOUNTER — Other Ambulatory Visit: Payer: Self-pay | Admitting: Emergency Medicine

## 2013-12-13 ENCOUNTER — Telehealth: Payer: Self-pay | Admitting: Oncology

## 2013-12-13 NOTE — Telephone Encounter (Signed)
s.w. pt and advised on March appt....pt ok and aware °

## 2013-12-26 ENCOUNTER — Encounter: Payer: Self-pay | Admitting: Physical Medicine & Rehabilitation

## 2013-12-26 ENCOUNTER — Encounter: Payer: PRIVATE HEALTH INSURANCE | Attending: Physical Medicine and Rehabilitation

## 2013-12-26 ENCOUNTER — Ambulatory Visit (HOSPITAL_BASED_OUTPATIENT_CLINIC_OR_DEPARTMENT_OTHER): Payer: PRIVATE HEALTH INSURANCE | Admitting: Physical Medicine & Rehabilitation

## 2013-12-26 ENCOUNTER — Encounter (INDEPENDENT_AMBULATORY_CARE_PROVIDER_SITE_OTHER): Payer: Self-pay

## 2013-12-26 VITALS — BP 116/68 | HR 71 | Resp 14 | Ht 64.0 in | Wt 125.0 lb

## 2013-12-26 DIAGNOSIS — M169 Osteoarthritis of hip, unspecified: Secondary | ICD-10-CM | POA: Insufficient documentation

## 2013-12-26 DIAGNOSIS — M545 Low back pain, unspecified: Secondary | ICD-10-CM | POA: Insufficient documentation

## 2013-12-26 DIAGNOSIS — M76899 Other specified enthesopathies of unspecified lower limb, excluding foot: Secondary | ICD-10-CM

## 2013-12-26 DIAGNOSIS — G8929 Other chronic pain: Secondary | ICD-10-CM | POA: Insufficient documentation

## 2013-12-26 DIAGNOSIS — M7060 Trochanteric bursitis, unspecified hip: Secondary | ICD-10-CM

## 2013-12-26 DIAGNOSIS — M79609 Pain in unspecified limb: Secondary | ICD-10-CM | POA: Insufficient documentation

## 2013-12-26 DIAGNOSIS — G5702 Lesion of sciatic nerve, left lower limb: Secondary | ICD-10-CM | POA: Insufficient documentation

## 2013-12-26 DIAGNOSIS — M7070 Other bursitis of hip, unspecified hip: Secondary | ICD-10-CM

## 2013-12-26 DIAGNOSIS — M25519 Pain in unspecified shoulder: Secondary | ICD-10-CM | POA: Insufficient documentation

## 2013-12-26 DIAGNOSIS — M1610 Unilateral primary osteoarthritis, unspecified hip: Secondary | ICD-10-CM | POA: Insufficient documentation

## 2013-12-26 DIAGNOSIS — G5700 Lesion of sciatic nerve, unspecified lower limb: Secondary | ICD-10-CM

## 2013-12-26 NOTE — Progress Notes (Signed)
Subjective:    Patient ID: Sabrina Sullivan, female    DOB: 03-27-1941, 73 y.o.   MRN: [id number] 1/6/2015Left piriformis injection under ultrasound guidance, resulted in 50% pain relief   HPI Has pain over left ischial area Having right hip pain which increases during sleep when landing on her right side Taking tramadol Vicente Males  needed basis not every day Also taking gabapentin twice a day rather than 3 times a day Also taking meloxicam on an as-needed basis Pain Inventory Average Pain 8 Pain Right Now 7 My pain is sharp, burning, tingling and aching  In the last 24 hours, has pain interfered with the following? General activity 6 Relation with others 6 Enjoyment of life 8 What TIME of day is your pain at its worst? evening Sleep (in general) Fair  Pain is worse with: walking, bending, sitting, standing and some activites Pain improves with: rest, heat/ice and injections Relief from Meds: 6  Mobility Do you have any goals in this area?  no  Function retired Do you have any goals in this area?  no  Neuro/Psych No problems in this area  Prior Studies Any changes since last visit?  no  Physicians involved in your care Any changes since last visit?  no   Family History  Problem Relation Age of Onset  . Cancer Mother     uterine  . Cancer Maternal Uncle     lung  . Cancer Daughter     breast   History   Social History  . Marital Status: Divorced    Spouse Name: N/A    Number of Children: N/A  . Years of Education: N/A   Occupational History  . Retired/Disability    Social History Main Topics  . Smoking status: Former Smoker -- 0.50 packs/day    Quit date: 01/22/2008  . Smokeless tobacco: Never Used  . Alcohol Use: Yes  . Drug Use: No  . 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 None   Social History Narrative  . None   Past Surgical History  Procedure Laterality Date  . Cervical fusion    . Rotator  cuff repair    . Replacement total knee    . Joint replacement    . Tonsillectomy    . Breast lumpectomy  02/02/2012    right  . Abdominal hysterectomy  1970    has ovaries   Past Medical History  Diagnosis Date  . Rosacea   . High cholesterol   . Low back pain   . Left arm pain   . Neck pain   . Thoracic back pain   . Complication of anesthesia 2006    POST OP HALLUCINATIONS AND CONFUSIION /"MAYBE SOMETHING FOR PAIN "  . GERD (gastroesophageal reflux disease)   . 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 Ear disorder     TINNITIS  . Urine frequency     AND PT GETS UP AT NIGHT  . Dental crowns present   . Cancer     breast- right  . History of radiation therapy 03/22/12-04/19/12    right breast   BP 116/68  Pulse 71  Resp 14  Ht _0  (1.626 m)  Wt 125 lb (56.7 kg)  BMI 21.45 kg/m2  SpO2 96%  Opioid Risk Score:   Fall Risk Score: Moderate Fall Risk (6-13 points) (patient educated handout given)   Review of Systems  Gastrointestinal: Positive for diarrhea.  Musculoskeletal: Positive for back pain.  All other systems reviewed and are negative.       Objective:   Physical Exam Tenderness to palpation right greater trochanter No tenderness to palpation left greater trochanter Tenderness to palpation left ischial tuberosity No tenderness to palpation over the left piriformis area Straight leg raising test is negative Motor strength is normal in both lower limbs       Assessment & Plan:  1. Piriformis syndrome improved after ultrasound guided injection, May reinject in 2 months if needed 2. Left ischial bursitis continue stretching, under injections under ultrasound guidance if worsening #3. Right trochanteric bursitis, stretching program given. May need to inject if no better or worse Return to clinic 3 months for followup

## 2013-12-26 NOTE — Patient Instructions (Signed)

## 2013-12-30 ENCOUNTER — Other Ambulatory Visit: Payer: Self-pay | Admitting: Physical Medicine & Rehabilitation

## 2014-01-29 ENCOUNTER — Other Ambulatory Visit: Payer: Self-pay | Admitting: Physical Medicine & Rehabilitation

## 2014-01-29 ENCOUNTER — Encounter: Payer: Self-pay | Admitting: Oncology

## 2014-01-29 ENCOUNTER — Telehealth: Payer: Self-pay | Admitting: *Deleted

## 2014-01-29 ENCOUNTER — Other Ambulatory Visit (HOSPITAL_BASED_OUTPATIENT_CLINIC_OR_DEPARTMENT_OTHER): Payer: PRIVATE HEALTH INSURANCE

## 2014-01-29 ENCOUNTER — Ambulatory Visit (HOSPITAL_BASED_OUTPATIENT_CLINIC_OR_DEPARTMENT_OTHER): Payer: PRIVATE HEALTH INSURANCE | Admitting: Oncology

## 2014-01-29 VITALS — BP 98/66 | HR 76 | Temp 98.5°F | Resp 18 | Ht 64.0 in | Wt 122.6 lb

## 2014-01-29 DIAGNOSIS — C50419 Malignant neoplasm of upper-outer quadrant of unspecified female breast: Secondary | ICD-10-CM

## 2014-01-29 DIAGNOSIS — C50411 Malignant neoplasm of upper-outer quadrant of right female breast: Secondary | ICD-10-CM

## 2014-01-29 LAB — CBC WITH DIFFERENTIAL/PLATELET
BASO%: 0.9 % (ref 0.0–2.0)
Basophils Absolute: 0 10*3/uL (ref 0.0–0.1)
EOS%: 4.3 % (ref 0.0–7.0)
Eosinophils Absolute: 0.2 10*3/uL (ref 0.0–0.5)
HCT: 42.5 % (ref 34.8–46.6)
HGB: 14 g/dL (ref 11.6–15.9)
LYMPH#: 1.2 10*3/uL (ref 0.9–3.3)
LYMPH%: 23.9 % (ref 14.0–49.7)
MCH: 30.7 pg (ref 25.1–34.0)
MCHC: 32.9 g/dL (ref 31.5–36.0)
MCV: 93.5 fL (ref 79.5–101.0)
MONO#: 0.6 10*3/uL (ref 0.1–0.9)
MONO%: 11.6 % (ref 0.0–14.0)
NEUT#: 3.1 10*3/uL (ref 1.5–6.5)
NEUT%: 59.3 % (ref 38.4–76.8)
PLATELETS: 123 10*3/uL — AB (ref 145–400)
RBC: 4.54 10*6/uL (ref 3.70–5.45)
RDW: 13.1 % (ref 11.2–14.5)
WBC: 5.2 10*3/uL (ref 3.9–10.3)

## 2014-01-29 LAB — COMPREHENSIVE METABOLIC PANEL (CC13)
ALT: 38 U/L (ref 0–55)
ANION GAP: 10 meq/L (ref 3–11)
AST: 39 U/L — AB (ref 5–34)
Albumin: 4.1 g/dL (ref 3.5–5.0)
Alkaline Phosphatase: 53 U/L (ref 40–150)
BILIRUBIN TOTAL: 0.5 mg/dL (ref 0.20–1.20)
BUN: 24.3 mg/dL (ref 7.0–26.0)
CO2: 26 meq/L (ref 22–29)
CREATININE: 1 mg/dL (ref 0.6–1.1)
Calcium: 10.5 mg/dL — ABNORMAL HIGH (ref 8.4–10.4)
Chloride: 106 mEq/L (ref 98–109)
Glucose: 89 mg/dl (ref 70–140)
Potassium: 4.8 mEq/L (ref 3.5–5.1)
SODIUM: 142 meq/L (ref 136–145)
TOTAL PROTEIN: 7.2 g/dL (ref 6.4–8.3)

## 2014-01-29 NOTE — Telephone Encounter (Signed)
appts made and printed...td 

## 2014-01-29 NOTE — Progress Notes (Signed)
OFFICE PROGRESS NOTE  CC  MOREIRA,ROY, MD [address] [place] [address] Dr. Stark Klein Dr. 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 She underwent a partial mastectomy with sentinel node procedure. 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 2 sentinel nodes were negative for metastatic disease. Tumor was ER +65% PR receptor negative HER-2/neu negative proliferation marker 12%.  #2 patient is now status post radiation therapy to the right breast administered by Dr. Gery Pray. She completed on 04/26/2012.  #3 patient has declined any antiestrogen therapy adjuvantly.  CURRENT THERAPY:Observation  INTERVAL HISTORY: Sabrina Sullivan 73 y.o. female returns for Followup visit today. From oncology perspective patient is doing well. She has no evidence of recurrent disease. 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 She is seen in the pain clinic for this. She denies any fevers chills night sweats headaches shortness of breath chest pains palpitations no myalgias and arthralgias. She does tell me that her surgical site is still touchy. She occasionally does get sharp shooting pains. Remainder of the 10 point review of systems is negative  MEDICAL HISTORY: Past Medical History  Diagnosis Date  . Rosacea   . High cholesterol   . Low back pain   . Left arm pain   . Neck pain   . Thoracic back pain   . Complication of anesthesia 2006    POST OP HALLUCINATIONS AND CONFUSIION /"MAYBE SOMETHING FOR PAIN "  . GERD (gastroesophageal reflux disease)   . 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 Ear disorder     TINNITIS  . Urine frequency     AND PT GETS UP AT  NIGHT  . Dental crowns present   . Cancer     breast- right  . History of radiation therapy 03/22/12-04/19/12    right breast    ALLERGIES:  is allergic to hydrocodone; morphine; and morphine and related.  MEDICATIONS:  Current Outpatient Prescriptions  Medication Sig Dispense Refill  . ALPRAZolam (XANAX) 0.5 MG tablet Take 0.5 mg by mouth 2 (two) times daily.      Marland Kitchen aluminum hydroxide-magnesium carbonate (GAVISCON) 95-358 MG/15ML SUSP Take by mouth. Take 30 mL by mouth Four (4) times daily after meals and at bedtime.      . calcium citrate (CALCITRATE - DOSED IN MG ELEMENTAL CALCIUM) 950 MG tablet Take 1 tablet by mouth daily.      . cholecalciferol (VITAMIN D) 1000 UNITS tablet Take 2,000 Units by mouth daily.       . diclofenac sodium (VOLTAREN) 1 % GEL Apply 1 application topically daily as needed. For inflammation on knee      . doxycycline (VIBRAMYCIN) 50 MG capsule Take 50 mg by mouth every other day. FOR ROSACEA      . fish oil-omega-3 fatty acids 1000 MG capsule Take 2 g by mouth daily.       Marland Kitchen gabapentin (NEURONTIN) 100 MG capsule TAKE 1 CAPSULE BY MOUTH THREE TIMES DAILY  90 capsule  0  . Magnesium 500 MG CAPS Take 1,000 mg by mouth daily.      . meloxicam (MOBIC) 7.5 MG tablet Take 1 tablet (7.5 mg total) by mouth daily as needed.  For pain  30 tablet  1  . omeprazole (PRILOSEC) 20 MG capsule       . pravastatin (PRAVACHOL) 20 MG tablet Take 20 mg by mouth daily.      . Probiotic Product (PROBIOTIC FORMULA PO) Take 1 each by mouth daily.      . raloxifene (EVISTA) 60 MG tablet Take 1 tablet (60 mg total) by mouth daily.  90 tablet  3  . tiZANidine (ZANAFLEX) 4 MG tablet Take 1 tablet (4 mg total) by mouth at bedtime.  30 tablet  3  . traMADol (ULTRAM) 50 MG tablet Take 1 tablet (50 mg total) by mouth 2 (two) times daily.  60 tablet  5  . vitamin B-12 (CYANOCOBALAMIN) 1000 MCG tablet Take 1,000 mcg by mouth daily.      Marland Kitchen esomeprazole (NEXIUM) 40 MG capsule Take 40 mg by mouth  daily as needed.       . methocarbamol (ROBAXIN) 500 MG tablet        No current facility-administered medications for this visit.    SURGICAL HISTORY:  Past Surgical History  Procedure Laterality Date  . Cervical fusion    . Rotator cuff repair    . Replacement total knee    . Joint replacement    . Tonsillectomy    . Breast lumpectomy  02/02/2012    right  . Abdominal hysterectomy  1970    has ovaries    REVIEW OF SYSTEMS:  Pertinent items are noted in HPI.   PHYSICAL EXAMINATION: General appearance: alert, cooperative and appears stated age Neck: no adenopathy, no carotid bruit, no JVD, supple, symmetrical, trachea midline and thyroid not enlarged, symmetric, no tenderness/mass/nodules Lymph nodes: Cervical, supraclavicular, and axillary nodes normal. Resp: clear to auscultation bilaterally and normal percussion bilaterally Back: symmetric, no curvature. ROM normal. No CVA tenderness. Cardio: regular rate and rhythm, S1, S2 normal, no murmur, click, rub or gallop GI: soft, non-tender; bowel sounds normal; no masses,  no organomegaly Extremities: extremities normal, atraumatic, no cyanosis or edema Neurologic: Grossly normal Bilateral breast examination left breast no masses nipple discharge no skin changes. Right breast reveals a well-healed incisional scar no masses no nodularity no nipple discharge or inversion. No other rashes. ECOG PERFORMANCE STATUS: 0 - Asymptomatic  Blood pressure 98/66, pulse 76, temperature 98.5 F (36.9 C), temperature source Oral, resp. rate 18, height 5' 4"  (1.626 m), weight 122 lb 9.6 oz (55.611 kg).  LABORATORY DATA: Lab Results  Component Value Date   WBC 5.2 01/29/2014   HGB 14.0 01/29/2014   HCT 42.5 01/29/2014   MCV 93.5 01/29/2014   PLT 123* 01/29/2014      Chemistry      Component Value Date/Time   NA 142 01/29/2014 1415   NA 136 06/16/2012 1200   K 4.8 01/29/2014 1415   K 4.4 06/16/2012 1200   CL 106 10/24/2012 1255   CL 101  06/16/2012 1200   CO2 26 01/29/2014 1415   CO2 23 06/16/2012 1200   BUN 24.3 01/29/2014 1415   BUN 20 06/16/2012 1200   CREATININE 1.0 01/29/2014 1415   CREATININE 0.98 06/16/2012 1200      Component Value Date/Time   CALCIUM 10.5* 01/29/2014 1415   CALCIUM 9.9 06/16/2012 1200   ALKPHOS 53 01/29/2014 1415   ALKPHOS 47 01/04/2012 1149   AST 39* 01/29/2014 1415   AST 20 01/04/2012 1149   ALT 38 01/29/2014 1415   ALT 13 01/04/2012 1149   BILITOT 0.50 01/29/2014 1415  BILITOT 0.4 01/04/2012 1149       RADIOGRAPHIC STUDIES:  No results found.  ASSESSMENT: 73 year old female with stage I invasive ductal carcinoma that was low grade ER positive PR negative HER-2/neu negative. Patient is status post partial mastectomy with sentinel node biopsy. The final pathology did show a 0.7 cm tumor 2 sentinel nodes were negative. She is now status post radiation therapy adjuvantly. Patient is currently taking Evista. But she is not on any other type of therapy. Is no evidence of recurrent disease.  PLAN:   #1 I will continue to see her once a year. Patient is also seeing Dr. Theda Sers. She will also see her once a year and we will plan on staggering our appointments so that she is seeing one of [place] every 6 months.     All questions were answered. The patient knows to call the clinic with any problems, questions or concerns. We can certainly see the patient much sooner if necessary.  I spent 15 minutes counseling the patient face to face. The total time spent in the appointment was 30 minutes.    Marcy Panning, MD Medical/Oncology [facility] [phone] (beeper) [phone] (Office)  01/29/2014, 3:44 PM

## 2014-02-05 ENCOUNTER — Other Ambulatory Visit: Payer: Self-pay | Admitting: Physical Medicine & Rehabilitation

## 2014-02-12 ENCOUNTER — Other Ambulatory Visit: Payer: Self-pay | Admitting: Oncology

## 2014-02-12 DIAGNOSIS — Z853 Personal history of malignant neoplasm of breast: Secondary | ICD-10-CM

## 2014-02-19 NOTE — Progress Notes (Signed)
[facility]  Sent to scan

## 2014-02-26 ENCOUNTER — Ambulatory Visit
Admission: RE | Admit: 2014-02-26 | Discharge: 2014-02-26 | Disposition: A | Discharge status: Home / Self Care | Payer: PRIVATE HEALTH INSURANCE | Source: Ambulatory Visit | Attending: Oncology | Admitting: Oncology

## 2014-02-26 DIAGNOSIS — Z853 Personal history of malignant neoplasm of breast: Secondary | ICD-10-CM

## 2014-02-27 ENCOUNTER — Other Ambulatory Visit: Payer: Self-pay | Admitting: Obstetrics and Gynecology

## 2014-02-27 DIAGNOSIS — M8580 Other specified disorders of bone density and structure, unspecified site: Secondary | ICD-10-CM

## 2014-03-04 ENCOUNTER — Other Ambulatory Visit: Payer: Self-pay | Admitting: Physical Medicine & Rehabilitation

## 2014-03-07 ENCOUNTER — Other Ambulatory Visit: Payer: Self-pay | Admitting: Physical Medicine & Rehabilitation

## 2014-03-24 ENCOUNTER — Ambulatory Visit: Payer: PRIVATE HEALTH INSURANCE | Admitting: Physical Medicine & Rehabilitation

## 2014-03-25 ENCOUNTER — Encounter (HOSPITAL_COMMUNITY): Payer: Self-pay

## 2014-03-25 ENCOUNTER — Encounter (HOSPITAL_COMMUNITY): Payer: Self-pay | Admitting: Emergency Medicine

## 2014-03-25 ENCOUNTER — Emergency Department (HOSPITAL_COMMUNITY)
Admission: EM | Admit: 2014-03-25 | Discharge: 2014-03-25 | Disposition: A | Discharge status: Home / Self Care | Payer: PRIVATE HEALTH INSURANCE | Attending: Emergency Medicine | Admitting: Emergency Medicine

## 2014-03-25 ENCOUNTER — Encounter (HOSPITAL_COMMUNITY)
Admission: EM | Disposition: A | Discharge status: Home / Self Care | Payer: Self-pay | Source: Home / Self Care | Attending: Emergency Medicine

## 2014-03-25 ENCOUNTER — Emergency Department (HOSPITAL_COMMUNITY): Payer: PRIVATE HEALTH INSURANCE

## 2014-03-25 ENCOUNTER — Ambulatory Visit (HOSPITAL_COMMUNITY): Admit: 2014-03-25 | Payer: Self-pay | Admitting: Gastroenterology

## 2014-03-25 DIAGNOSIS — Z8669 Personal history of other diseases of the nervous system and sense organs: Secondary | ICD-10-CM | POA: Insufficient documentation

## 2014-03-25 DIAGNOSIS — R1310 Dysphagia, unspecified: Secondary | ICD-10-CM | POA: Diagnosis present

## 2014-03-25 DIAGNOSIS — Z792 Long term (current) use of antibiotics: Secondary | ICD-10-CM | POA: Insufficient documentation

## 2014-03-25 DIAGNOSIS — R1033 Periumbilical pain: Secondary | ICD-10-CM | POA: Diagnosis not present

## 2014-03-25 DIAGNOSIS — K449 Diaphragmatic hernia without obstruction or gangrene: Secondary | ICD-10-CM | POA: Diagnosis not present

## 2014-03-25 DIAGNOSIS — Z872 Personal history of diseases of the skin and subcutaneous tissue: Secondary | ICD-10-CM | POA: Diagnosis not present

## 2014-03-25 DIAGNOSIS — Z87891 Personal history of nicotine dependence: Secondary | ICD-10-CM | POA: Insufficient documentation

## 2014-03-25 DIAGNOSIS — Z923 Personal history of irradiation: Secondary | ICD-10-CM | POA: Insufficient documentation

## 2014-03-25 DIAGNOSIS — Z79899 Other long term (current) drug therapy: Secondary | ICD-10-CM | POA: Diagnosis not present

## 2014-03-25 DIAGNOSIS — M1990 Unspecified osteoarthritis, unspecified site: Secondary | ICD-10-CM | POA: Diagnosis not present

## 2014-03-25 DIAGNOSIS — K219 Gastro-esophageal reflux disease without esophagitis: Secondary | ICD-10-CM | POA: Insufficient documentation

## 2014-03-25 DIAGNOSIS — Z853 Personal history of malignant neoplasm of breast: Secondary | ICD-10-CM | POA: Insufficient documentation

## 2014-03-25 DIAGNOSIS — E7800 Pure hypercholesterolemia, unspecified: Secondary | ICD-10-CM | POA: Diagnosis not present

## 2014-03-25 LAB — CBC WITH DIFFERENTIAL/PLATELET
BASOS ABS: 0 10*3/uL (ref 0.0–0.1)
Basophils Relative: 0 % (ref 0–1)
EOS ABS: 0.1 10*3/uL (ref 0.0–0.7)
Eosinophils Relative: 2 % (ref 0–5)
HCT: 38.4 % (ref 36.0–46.0)
Hemoglobin: 12.7 g/dL (ref 12.0–15.0)
LYMPHS ABS: 1.1 10*3/uL (ref 0.7–4.0)
LYMPHS PCT: 23 % (ref 12–46)
MCH: 31.1 pg (ref 26.0–34.0)
MCHC: 33.1 g/dL (ref 30.0–36.0)
MCV: 94.1 fL (ref 78.0–100.0)
Monocytes Absolute: 0.5 10*3/uL (ref 0.1–1.0)
Monocytes Relative: 10 % (ref 3–12)
Neutro Abs: 3.1 10*3/uL (ref 1.7–7.7)
Neutrophils Relative %: 65 % (ref 43–77)
Platelets: 111 10*3/uL — ABNORMAL LOW (ref 150–400)
RBC: 4.08 MIL/uL (ref 3.87–5.11)
RDW: 13.2 % (ref 11.5–15.5)
WBC: 4.8 10*3/uL (ref 4.0–10.5)

## 2014-03-25 LAB — BASIC METABOLIC PANEL
BUN: 15 mg/dL (ref 6–23)
CO2: 25 mEq/L (ref 19–32)
Calcium: 10.2 mg/dL (ref 8.4–10.5)
Chloride: 102 mEq/L (ref 96–112)
Creatinine, Ser: 1.19 mg/dL — ABNORMAL HIGH (ref 0.50–1.10)
GFR calc Af Amer: 51 mL/min — ABNORMAL LOW (ref >90)
GFR, EST NON AFRICAN AMERICAN: 44 mL/min — AB (ref >90)
Glucose, Bld: 85 mg/dL (ref 70–99)
POTASSIUM: 4.6 meq/L (ref 3.7–5.3)
Sodium: 140 mEq/L (ref 137–147)

## 2014-03-25 SURGERY — EGD (ESOPHAGOGASTRODUODENOSCOPY)
Anesthesia: Moderate Sedation

## 2014-03-25 NOTE — ED Provider Notes (Signed)
CSN: [id number]     Arrival date & time 03/25/14  1618 History   First MD Initiated Contact with Patient 03/25/14 1655     Chief Complaint  Patient presents with  . post op/ difficulty swallowing      (Consider location/radiation/quality/duration/timing/severity/associated sxs/prior Treatment) HPI Comments: 73 year old female who presents to the hospital after noticing that she had difficulty swallowing last night. She was recently intubated for a cystoscopy with a distention of the bladder he, and he was discharged on Friday, he seemed to do well on Saturday and Sunday except for a hoarse voice which was expected but noticed last night while eating broccoli and ground [employer] she felt as if the food would not go down, was caught in her chest and she points at her lower sternal area.  This morning she was able to chew up toast very fine like a paste and tolerate a small amount of tea which she endorses was very difficult to do however she was able to keep that down. Since that time she has had no success in swallowing anything including liquids or solids and has continually vomited when attempting to do this. Nothing seems to make this better or worse, no associated fevers chills or diarrhea. She called her gastroenterologist at [facility], a nurse recommended she come to the hospital immediately. Her local gastroenterologist is Dr. Earle Gell.  The patient does have a history of right breast cancer, treated with lumpectomy and radiation therapy. She also has a history of a hiatal hernia and acid reflux with reflux esophagitis.  The history is provided by the patient and a friend.    Past Medical History  Diagnosis Date  . Rosacea   . High cholesterol   . Low back pain   . Left arm pain   . Neck pain   . Thoracic back pain   . Complication of anesthesia 2006    POST OP HALLUCINATIONS AND CONFUSIION /"MAYBE SOMETHING FOR PAIN "  . GERD (gastroesophageal reflux disease)    . 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 Ear disorder     TINNITIS  . Urine frequency     AND PT GETS UP AT NIGHT  . Dental crowns present   . Cancer     breast- right  . History of radiation therapy 03/22/12-04/19/12    right breast   Past Surgical History  Procedure Laterality Date  . Cervical fusion    . Rotator cuff repair    . Replacement total knee    . Joint replacement    . Tonsillectomy    . Breast lumpectomy  02/02/2012    right  . Abdominal hysterectomy  1970    has ovaries   Family History  Problem Relation Age of Onset  . Cancer Mother     uterine  . Cancer Maternal Uncle     lung  . Cancer Daughter     breast   History  Substance Use Topics  . Smoking status: Former Smoker -- 0.50 packs/day    Quit date: 01/22/2008  . Smokeless tobacco: Never Used  . Alcohol Use: Yes   OB History   Grav Para Term Preterm Abortions TAB SAB Ect Mult Living                 Review of Systems  All other systems reviewed and are negative.     Allergies  Hydrocodone; Morphine; and Morphine and related  Home Medications   Prior  to Admission medications   Medication Sig Start Date End Date Taking? Authorizing Provider  ALPRAZolam Duanne Moron) 0.5 MG tablet Take 0.5 mg by mouth 2 (two) times daily.    Historical Provider, MD  aluminum hydroxide-magnesium carbonate (GAVISCON) 95-358 MG/15ML SUSP Take by mouth. Take 30 mL by mouth Four (4) times daily after meals and at bedtime. 05/15/13   Historical Provider, MD  calcium citrate (CALCITRATE - DOSED IN MG ELEMENTAL CALCIUM) 950 MG tablet Take 1 tablet by mouth daily.    Historical Provider, MD  cholecalciferol (VITAMIN D) 1000 UNITS tablet Take 2,000 Units by mouth daily.     Historical Provider, MD  diclofenac sodium (VOLTAREN) 1 % GEL Apply 1 application topically daily as needed. For inflammation on knee    Historical Provider, MD  doxycycline (VIBRAMYCIN) 50 MG capsule Take 50 mg by mouth every other day. FOR ROSACEA     Historical Provider, MD  esomeprazole (NEXIUM) 40 MG capsule Take 40 mg by mouth daily as needed.     Historical Provider, MD  fish oil-omega-3 fatty acids 1000 MG capsule Take 2 g by mouth daily.     Historical Provider, MD  gabapentin (NEURONTIN) 100 MG capsule TAKE ONE CAPSULE BY MOUTH THREE TIMES DAILY 03/04/14   Charlett Blake, MD  Magnesium 500 MG CAPS Take 1,000 mg by mouth daily.    Historical Provider, MD  meloxicam (MOBIC) 7.5 MG tablet TAKE 1 TABLET BY MOUTH DAILY AS NEEDED FOR PAIN 02/05/14   Charlett Blake, MD  methocarbamol (ROBAXIN) 500 MG tablet  10/09/13   Historical Provider, MD  omeprazole (PRILOSEC) 20 MG capsule  12/22/12   Historical Provider, MD  pravastatin (PRAVACHOL) 20 MG tablet Take 20 mg by mouth daily.    Historical Provider, MD  Probiotic Product (PROBIOTIC FORMULA PO) Take 1 each by mouth daily.    Historical Provider, MD  raloxifene (EVISTA) 60 MG tablet Take 1 tablet (60 mg total) by mouth daily. 11/15/13   Deatra Robinson, MD  tiZANidine (ZANAFLEX) 4 MG tablet TAKE 1 TABLET BY MOUTH AT BEDTIME 03/07/14   Charlett Blake, MD  traMADol (ULTRAM) 50 MG tablet Take 1 tablet (50 mg total) by mouth 2 (two) times daily. 08/26/13   Charlett Blake, MD  vitamin B-12 (CYANOCOBALAMIN) 1000 MCG tablet Take 1,000 mcg by mouth daily.    Historical Provider, MD   BP 119/75  Pulse 115  Temp(Src) 98.4 F (36.9 C) (Oral)  Resp 14  Ht 5\' 4"  (1.626 m)  Wt 120 lb (54.432 kg)  BMI 20.59 kg/m2  SpO2 96% Physical Exam  Nursing note and vitals reviewed. Constitutional: She appears well-developed and well-nourished. No distress.  HENT:  Head: Normocephalic and atraumatic.  Mouth/Throat: Oropharynx is clear and moist. No oropharyngeal exudate.  Oropharynx is clear, patent and moist, no swelling exudate hypertrophy or erythema.  Eyes: Conjunctivae and EOM are normal. Pupils are equal, round, and reactive to light. Right eye exhibits no discharge. Left eye exhibits no  discharge. No scleral icterus.  Neck: Normal range of motion. Neck supple. No JVD present. No thyromegaly present.  Cardiovascular: Normal rate, regular rhythm, normal heart sounds and intact distal pulses.  Exam reveals no gallop and no friction rub.   No murmur heard. Pulmonary/Chest: Effort normal and breath sounds normal. No respiratory distress. She has no wheezes. She has no rales.  Abdominal: Soft. Bowel sounds are normal. She exhibits no distension and no mass. There is no tenderness.  Abdomen is  very soft, minimal periumbilical tenderness which she states is related to her surgery.  Musculoskeletal: Normal range of motion. She exhibits no edema and no tenderness.  Lymphadenopathy:    She has no cervical adenopathy.  Neurological: She is alert. Coordination normal.  Skin: Skin is warm and dry. No rash noted. No erythema.  Psychiatric: She has a normal mood and affect. Her behavior is normal.    ED Course  Procedures (including critical care time) Labs Review Labs Reviewed  CBC WITH DIFFERENTIAL - Abnormal; Notable for the following:    Platelets 111 (*)    All other components within normal limits  BASIC METABOLIC PANEL - Abnormal; Notable for the following:    Creatinine, Ser 1.19 (*)    GFR calc non Af Amer 44 (*)    GFR calc Af Amer 51 (*)    All other components within normal limits    Imaging Review Dg Chest 2 View  03/25/2014   CLINICAL DATA:  Lower chest pain, dysphagia  EXAM: CHEST  2 VIEW  COMPARISON:  09/25/2013  FINDINGS: Cardiomediastinal silhouette is unremarkable. Mild hyperinflation. Surgical clips are noted in right breast and right axilla. There is moderate size probable paraesophageal hiatal hernia with air-fluid level measures 6 by 5.8 cm. Osteopenia and degenerative changes thoracic spine. Mild thoracic dextroscoliosis. No acute infiltrate or pulmonary edema.  IMPRESSION: No acute infiltrate or pulmonary edema. Moderate-sized probable paraesophageal hiatal  hernia with air-fluid level.   Electronically Signed   By: Lahoma Crocker M.D.   On: 03/25/2014 17:43      MDM   Final diagnoses:  Dysphagia  Hiatal hernia    The patient reports that she has a history of requiring an esophageal dilation years ago for a stricture, she has only had that done one time. Her vital signs are unremarkable except for mild tachycardia, I would consider that she has recurrence of her stricture, this could also be related to the medications which she has recently started for her urinary symptoms after the procedure, will discuss with gastroenterology.  7:30 PM, discussed with gastroenterologist Dr. Paulita Fujita, patient has been able to tolerate oral fluids in front of me without vomiting, she is not having a distress, she is able to tolerate her own secretions. At this time it has been agreed upon that the patient can followup in the morning at the office, the patient is also in agreement with this plan and will follow a fluid only diet this evening. On repeat exam the patient appears stable for discharge.    Johnna Acosta, MD 03/25/14 [phone]

## 2014-03-25 NOTE — ED Notes (Addendum)
Pt reports on 5/1 she had biopsy for hydo-bladder distention and intersticial cystitis at [facility]. Pt reports she was intubated for biopsy discharged home same day. Had foley catheter since Monday. Pt has been hoarse since then and last night pt started not being able to swallowing and having pain, occasionally can swallow food if it is baby food consistency. Pt reports feels like there is something stuck in her throat. Pt denies pain.

## 2014-03-25 NOTE — Discharge Instructions (Signed)
Liquid only diet tonight, call office in the morning  Return if you are persistently vomiting

## 2014-03-25 NOTE — ED Notes (Signed)
Endo RN at bedside, states that endo is ordered for pt.  [facility] states that endo was ordered for pt per Dr. Paulita Fujita but was cancelled.

## 2014-03-25 NOTE — ED Notes (Signed)
Pt is resting comfortably with family at bedside.  Was drinking water, keeping it down.

## 2014-04-01 ENCOUNTER — Other Ambulatory Visit: Payer: PRIVATE HEALTH INSURANCE

## 2014-04-06 NOTE — H&P (Signed)
  Problem: Esophageal dysphagia and microscopic colitis  History: The patient is a 73 year old female born 1941-11-15 who intermittently experiences solid food dysphagia associated with a normal esophagogastroduodenoscopy in June 2010, normal barium esophagram with barium tablet swallow in February 2014, and normal esophagogastroduodenoscopy with esophageal biopsies in March 2014.  The patient was seen in the [facility] emergency room feeling as though food was stuck in her esophagus. The patient was released from the emergency room when her esophageal obstructive symptoms resolved before having a diagnostic esophagogastroduodenoscopy.  On 03/27/2014, the patient underwent a diagnostic esophagogastroduodenoscopy which showed a medium-sized hiatal hernia associated with linear erosions in the gastric body at the diaphragmatic hiatus. No esophageal obstruction was present.  The patient is scheduled to undergo a diagnostic esophageal manometry.  Past medical history: July 2006 normal screening colonoscopy. Colon biopsies showed microscopic colitis. Left colonic diverticulosis. Hysterectomy. Appendectomy. Endometriosis. Abdominal laparoscopy with adhesion lysis in 1991 and 1994. Irritable bowel syndrome. 1997 air-contrast barium enema showed colonic diverticulosis. Cervical spine surgery. Osteoarthritis. Left knee arthroscopy in 2005. Right rotator cuff surgery in 1996. Normal CT scan of the abdomen and pelvis in November 2007. Normal CT scan of the abdomen and pelvis in August 1994. Moderate sized hiatal hernia by upper GI x-ray series in November 2001. Normal abdominal ultrasound in November 2001.  Medication allergies: Morphine  Plan: Proceed with diagnostic high-resolution manometry. Esophagogastric junction was noted at 36 cm from the incisor teeth by esophagogastroduodenoscopy. Moderate sized hiatal hernia.

## 2014-04-07 ENCOUNTER — Ambulatory Visit (HOSPITAL_COMMUNITY)
Admission: RE | Admit: 2014-04-07 | Discharge: 2014-04-07 | Disposition: A | Discharge status: Home / Self Care | Payer: PRIVATE HEALTH INSURANCE | Source: Ambulatory Visit | Attending: Gastroenterology | Admitting: Gastroenterology

## 2014-04-07 ENCOUNTER — Encounter (HOSPITAL_COMMUNITY)
Admission: RE | Disposition: A | Discharge status: Home / Self Care | Payer: Self-pay | Source: Ambulatory Visit | Attending: Gastroenterology

## 2014-04-07 DIAGNOSIS — Z885 Allergy status to narcotic agent status: Secondary | ICD-10-CM | POA: Insufficient documentation

## 2014-04-07 DIAGNOSIS — R1310 Dysphagia, unspecified: Secondary | ICD-10-CM | POA: Insufficient documentation

## 2014-04-07 DIAGNOSIS — Z8719 Personal history of other diseases of the digestive system: Secondary | ICD-10-CM | POA: Insufficient documentation

## 2014-04-07 HISTORY — PX: ESOPHAGEAL MANOMETRY: SHX5429

## 2014-04-07 SURGERY — MANOMETRY, ESOPHAGUS

## 2014-04-07 MED ORDER — LIDOCAINE VISCOUS 2 % MT SOLN
OROMUCOSAL | Status: AC
  Filled 2014-04-07: qty 15

## 2014-04-07 SURGICAL SUPPLY — 1 items: FACESHIELD LNG OPTICON STERILE (SAFETY) IMPLANT

## 2014-04-08 ENCOUNTER — Encounter (HOSPITAL_COMMUNITY): Payer: Self-pay | Admitting: Gastroenterology

## 2014-04-08 NOTE — Op Note (Signed)
Procedure: High-resolution esophageal manometry  Findings:  #1. Upper esophageal sphincter: Normal basal pressure and relaxation  #2. Lower esophageal sphincter: Normal basal pressure with incomplete lower esophageal sphincter relaxation  #3. Esophageal body function: Esophageal body contractions were peristaltic with normal amplitude. 20% of swallows were associated with incomplete bolus clearance.  Assessment: Incomplete lower esophageal sphincter relaxation with 20% incomplete esophageal bolus clearance. Peristaltic contractions of normal amplitude of the esophageal body.

## 2014-04-13 ENCOUNTER — Other Ambulatory Visit: Payer: Self-pay | Admitting: Physical Medicine & Rehabilitation

## 2014-04-25 ENCOUNTER — Ambulatory Visit: Payer: PRIVATE HEALTH INSURANCE | Admitting: Physical Medicine & Rehabilitation

## 2014-05-13 ENCOUNTER — Other Ambulatory Visit: Payer: Self-pay | Admitting: Physical Medicine & Rehabilitation

## 2014-06-09 ENCOUNTER — Encounter: Payer: PRIVATE HEALTH INSURANCE | Attending: Physical Medicine & Rehabilitation

## 2014-06-09 ENCOUNTER — Ambulatory Visit (HOSPITAL_BASED_OUTPATIENT_CLINIC_OR_DEPARTMENT_OTHER): Payer: PRIVATE HEALTH INSURANCE | Admitting: Physical Medicine & Rehabilitation

## 2014-06-09 ENCOUNTER — Encounter: Payer: Self-pay | Admitting: Physical Medicine & Rehabilitation

## 2014-06-09 VITALS — BP 118/70 | HR 83 | Resp 14 | Ht 64.0 in | Wt 118.0 lb

## 2014-06-09 DIAGNOSIS — G5700 Lesion of sciatic nerve, unspecified lower limb: Secondary | ICD-10-CM

## 2014-06-09 DIAGNOSIS — G5702 Lesion of sciatic nerve, left lower limb: Secondary | ICD-10-CM

## 2014-06-09 DIAGNOSIS — M545 Low back pain, unspecified: Secondary | ICD-10-CM | POA: Diagnosis not present

## 2014-06-09 DIAGNOSIS — Z87891 Personal history of nicotine dependence: Secondary | ICD-10-CM | POA: Diagnosis not present

## 2014-06-09 DIAGNOSIS — N3010 Interstitial cystitis (chronic) without hematuria: Secondary | ICD-10-CM | POA: Diagnosis not present

## 2014-06-09 DIAGNOSIS — N3289 Other specified disorders of bladder: Secondary | ICD-10-CM | POA: Diagnosis not present

## 2014-06-09 DIAGNOSIS — Z96659 Presence of unspecified artificial knee joint: Secondary | ICD-10-CM | POA: Diagnosis not present

## 2014-06-09 MED ORDER — TRAMADOL HCL 50 MG PO TABS: 50.0000 mg | ORAL_TABLET | Freq: Two times a day (BID) | ORAL | Status: DC | PRN

## 2014-06-09 NOTE — Patient Instructions (Addendum)
preauthorization for botox, if not authorized then can do injection with cortisone again

## 2014-06-09 NOTE — Progress Notes (Signed)
Subjective:    Patient ID: Sabrina Sullivan, female    DOB: [birth year], 73 y.o.   MRN: [id number]  HPI Spastic bladder-IC, takes [place] Took some hydrocodone from urologist for post urologic procedure, fell as well as became confused  Constipation with tramadol and every other narcotic analgesic Pain Inventory Average Pain 8 Pain Right Now 8 My pain is constant, burning, dull, tingling and aching  In the last 24 hours, has pain interfered with the following? General activity 8 Relation with others 8 Enjoyment of life 9 What TIME of day is your pain at its worst? evening Sleep (in general) Fair  Pain is worse with: sitting Pain improves with: rest and heat/ice Relief from Meds: 6  Mobility walk without assistance ability to climb steps?  yes do you drive?  yes Do you have any goals in this area?  no  Function disabled: date disabled . Do you have any goals in this area?  no  Neuro/Psych trouble walking  Prior Studies Any changes since last visit?  yes  Physicians involved in your care Sabrina Sullivan 3   Family History  Problem Relation Age of Onset  . Cancer Mother     uterine  . Cancer Maternal Uncle     lung  . Cancer Daughter     breast   History   Social History  . Marital Status: Divorced    Spouse Name: N/A    Number of Children: N/A  . Years of Education: N/A   Occupational History  . Retired/Disability    Social History Main Topics  . Smoking status: Former Smoker -- 0.50 packs/day    Quit date: 01/22/2008  . Smokeless tobacco: Never Used  . Alcohol Use: Yes  . Drug Use: No  . 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 None   Social History Narrative  . None   Past Surgical History  Procedure Laterality Date  . Cervical fusion    . Rotator cuff repair    . Replacement total knee    . Joint replacement    . Tonsillectomy    . Breast lumpectomy  02/02/2012    right  . Abdominal  hysterectomy  1970    has ovaries  . Esophageal manometry N/A 04/07/2014    Procedure: ESOPHAGEAL MANOMETRY (EM);  Surgeon: Garlan Fair, MD;  Location: [facility];  Service: Endoscopy;  Laterality: N/A;   Past Medical History  Diagnosis Date  . Rosacea   . High cholesterol   . Low back pain   . Left arm pain   . Neck pain   . Thoracic back pain   . Complication of anesthesia 2006    POST OP HALLUCINATIONS AND CONFUSIION /"MAYBE SOMETHING FOR PAIN "  . GERD (gastroesophageal reflux disease)   . 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 Ear disorder     TINNITIS  . Urine frequency     AND PT GETS UP AT NIGHT  . Dental crowns present   . Cancer     breast- right  . History of radiation therapy 03/22/12-04/19/12    right breast   BP 118/70  Pulse 83  Resp 14  Ht 5' 4" (1.626 m)  Wt 118 lb (53.524 kg)  BMI 20.24 kg/m2  SpO2 97%  Opioid Risk Score:   Fall Risk Score: High Fall Risk (>13 points) (patient educated handout declined)   Review of  Systems  Musculoskeletal: Positive for back pain.  All other systems reviewed and are negative.      Objective:   Physical Exam  Tenderness over the left piriformis. Negative straight leg raising Good range of motion with piriformis stretching bilaterally. Hip range of motion normal bilaterally ,LE. strength is normal bilaterally  No tenderness over the hip greater trochanters    Assessment & Plan:  1. Left piriformis syndrome not having any sciatic irritation however. Would repeat piriformis injection. Will check for insurance preauthorization for Botox 2. Interstitial cystitis following up with urology. 

## 2014-06-14 ENCOUNTER — Other Ambulatory Visit: Payer: Self-pay | Admitting: Physical Medicine & Rehabilitation

## 2014-07-09 ENCOUNTER — Telehealth: Payer: Self-pay | Admitting: Hematology and Oncology

## 2014-07-09 NOTE — Telephone Encounter (Signed)
, °

## 2014-07-24 ENCOUNTER — Other Ambulatory Visit: Payer: Self-pay

## 2014-07-24 DIAGNOSIS — C50419 Malignant neoplasm of upper-outer quadrant of unspecified female breast: Secondary | ICD-10-CM

## 2014-07-25 ENCOUNTER — Telehealth: Payer: Self-pay | Admitting: Hematology and Oncology

## 2014-07-25 ENCOUNTER — Encounter: Payer: Self-pay | Admitting: Hematology and Oncology

## 2014-07-25 ENCOUNTER — Ambulatory Visit (HOSPITAL_BASED_OUTPATIENT_CLINIC_OR_DEPARTMENT_OTHER): Payer: PRIVATE HEALTH INSURANCE | Admitting: Hematology and Oncology

## 2014-07-25 ENCOUNTER — Other Ambulatory Visit (HOSPITAL_BASED_OUTPATIENT_CLINIC_OR_DEPARTMENT_OTHER): Payer: PRIVATE HEALTH INSURANCE

## 2014-07-25 VITALS — BP 132/82 | HR 67 | Temp 97.9°F | Resp 18 | Ht 64.0 in | Wt 119.4 lb

## 2014-07-25 DIAGNOSIS — C50419 Malignant neoplasm of upper-outer quadrant of unspecified female breast: Secondary | ICD-10-CM

## 2014-07-25 DIAGNOSIS — Z853 Personal history of malignant neoplasm of breast: Secondary | ICD-10-CM

## 2014-07-25 LAB — COMPREHENSIVE METABOLIC PANEL (CC13)
ALT: 32 U/L (ref 0–55)
AST: 35 U/L — ABNORMAL HIGH (ref 5–34)
Albumin: 3.9 g/dL (ref 3.5–5.0)
Alkaline Phosphatase: 50 U/L (ref 40–150)
Anion Gap: 6 mEq/L (ref 3–11)
BUN: 21.9 mg/dL (ref 7.0–26.0)
CALCIUM: 10.1 mg/dL (ref 8.4–10.4)
CHLORIDE: 108 meq/L (ref 98–109)
CO2: 28 mEq/L (ref 22–29)
CREATININE: 0.9 mg/dL (ref 0.6–1.1)
Glucose: 83 mg/dl (ref 70–140)
Potassium: 4.6 mEq/L (ref 3.5–5.1)
Sodium: 142 mEq/L (ref 136–145)
Total Bilirubin: 0.6 mg/dL (ref 0.20–1.20)
Total Protein: 6.7 g/dL (ref 6.4–8.3)

## 2014-07-25 LAB — CBC WITH DIFFERENTIAL/PLATELET
BASO%: 1.5 % (ref 0.0–2.0)
Basophils Absolute: 0.1 10*3/uL (ref 0.0–0.1)
EOS%: 3.3 % (ref 0.0–7.0)
Eosinophils Absolute: 0.1 10*3/uL (ref 0.0–0.5)
HCT: 41.9 % (ref 34.8–46.6)
HGB: 13.5 g/dL (ref 11.6–15.9)
LYMPH#: 1.5 10*3/uL (ref 0.9–3.3)
LYMPH%: 33.5 % (ref 14.0–49.7)
MCH: 30.6 pg (ref 25.1–34.0)
MCHC: 32.2 g/dL (ref 31.5–36.0)
MCV: 95.3 fL (ref 79.5–101.0)
MONO#: 0.6 10*3/uL (ref 0.1–0.9)
MONO%: 12.9 % (ref 0.0–14.0)
NEUT#: 2.1 10*3/uL (ref 1.5–6.5)
NEUT%: 48.8 % (ref 38.4–76.8)
Platelets: 144 10*3/uL — ABNORMAL LOW (ref 145–400)
RBC: 4.4 10*6/uL (ref 3.70–5.45)
RDW: 13.8 % (ref 11.2–14.5)
WBC: 4.4 10*3/uL (ref 3.9–10.3)

## 2014-07-25 NOTE — Telephone Encounter (Signed)
per pof to sch pt appt-sch pt mamma-gave pt copy of sch

## 2014-07-25 NOTE — Progress Notes (Signed)
Patient Care Team: Jilda Panda, MD as PCP - General (Internal Medicine)  DIAGNOSIS: Cancer of upper-outer quadrant of female breast, Right T1aN0M0   Primary site: Breast (Right)   Staging method: AJCC 7th Edition   Clinical: Stage IA (T1a, N0, cM0)   Summary: Stage IA (T1a, N0, cM0)   Clinical comments: Staged in Breast Conference 01/04/12   SUMMARY OF ONCOLOGIC HISTORY:   Cancer of upper-outer quadrant of female breast, Right T1aN0M0   01/04/2012 Initial Diagnosis Cancer of upper-outer quadrant of female breast, Right T1aN0M0   02/02/2012 Surgery Right breast lumpectomy with SLN biopsy, IDC low-grade 0.7 cm with intermediate grade DCIS, 2 SLN negative ER 65%, PR negative, HER-2 negative, Ki-67 12%   03/26/2012 - 04/26/2012 Radiation Therapy Radiation therapy to lumpectomy site by Dr. Gery Pray    Anti-estrogen oral therapy Patient declined antiestrogen therapy    CHIEF COMPLIANT: Occasional breast tenderness on the right side  INTERVAL HISTORY: Sabrina Sullivan is a 73 year old Caucasian lady with above-mentioned history of right breast cancer treated with lumpectomy radiation. Even though she refuses antiestrogen therapy, she has been on Evista for osteoporosis which also has antiestrogen properties. She is staying fairly active. She wishes she gained a little weight. Occasional right breast tenderness for otherwise no new complaints   REVIEW OF SYSTEMS:   Constitutional: Denies fevers, chills or abnormal weight loss Eyes: Denies blurriness of vision Ears, nose, mouth, throat, and face: Denies mucositis or sore throat Respiratory: Denies cough, dyspnea or wheezes Cardiovascular: Denies palpitation, chest discomfort or lower extremity swelling Gastrointestinal:  Denies nausea, heartburn or change in bowel habits Skin: Denies abnormal skin rashes Lymphatics: Denies new lymphadenopathy or easy bruising Neurological:Denies numbness, tingling or new weaknesses Behavioral/Psych: Mood is stable, no  new changes  Breast:  denies any pain or lumps or nodules in either breasts All other systems were reviewed with the patient and are negative.  I have reviewed the past medical history, past surgical history, social history and family history with the patient and they are unchanged from previous note.  ALLERGIES:  is allergic to hydrocodone; morphine; and morphine and related.  MEDICATIONS:  Current Outpatient Prescriptions  Medication Sig Dispense Refill  . ALPRAZolam (XANAX) 0.5 MG tablet Take 0.5 mg by mouth 2 (two) times daily.      Marland Kitchen aluminum hydroxide-magnesium carbonate (GAVISCON) 95-358 MG/15ML SUSP Take by mouth. Take 30 mL by mouth Four (4) times daily after meals and at bedtime.      . calcium citrate (CALCITRATE - DOSED IN MG ELEMENTAL CALCIUM) 950 MG tablet Take 1 tablet by mouth daily.      . cholecalciferol (VITAMIN D) 1000 UNITS tablet Take 2,000 Units by mouth daily.       Marland Kitchen doxycycline (VIBRAMYCIN) 50 MG capsule Take 50 mg by mouth as needed (roseacea). FOR ROSACEA      . ELMIRON 100 MG capsule       . fish oil-omega-3 fatty acids 1000 MG capsule Take 2 g by mouth daily.       Marland Kitchen gabapentin (NEURONTIN) 100 MG capsule Take 100 mg by mouth 2 (two) times daily.      . Magnesium 500 MG CAPS Take 1,000 mg by mouth daily.      . meloxicam (MOBIC) 7.5 MG tablet Take 7.5 mg by mouth daily as needed for pain.      Marland Kitchen omeprazole (PRILOSEC) 20 MG capsule Take 20 mg by mouth as needed (acid reflux).       . pravastatin (  PRAVACHOL) 20 MG tablet Take 20 mg by mouth daily.      . Probiotic Product (PROBIOTIC FORMULA PO) Take 1 each by mouth daily.      . raloxifene (EVISTA) 60 MG tablet Take 1 tablet (60 mg total) by mouth daily.  90 tablet  3  . tiZANidine (ZANAFLEX) 4 MG tablet TAKE 1 TABLET BY MOUTH EVERY NIGHT AT BEDTIME  30 tablet  0  . traMADol (ULTRAM) 50 MG tablet Take 1 tablet (50 mg total) by mouth every 12 (twelve) hours as needed.  60 tablet  5  . traZODone (DESYREL) 50 MG  tablet       . esomeprazole (NEXIUM) 40 MG capsule Take 40 mg by mouth as needed (acid reflux).       . vitamin B-12 (CYANOCOBALAMIN) 1000 MCG tablet Take 1,000 mcg by mouth daily.       No current facility-administered medications for this visit.    PHYSICAL EXAMINATION: ECOG PERFORMANCE STATUS: 0 - Asymptomatic  Filed Vitals:   07/25/14 1014  BP: 132/82  Pulse: 67  Temp: 97.9 F (36.6 C)  Resp: 18   Filed Weights   07/25/14 1014  Weight: 119 lb 7 oz (54.176 kg)    GENERAL:alert, no distress and comfortable SKIN: skin color, texture, turgor are normal, no rashes or significant lesions EYES: normal, Conjunctiva are pink and non-injected, sclera clear OROPHARYNX:no exudate, no erythema and lips, buccal mucosa, and tongue normal  NECK: supple, thyroid normal size, non-tender, without nodularity LYMPH:  no palpable lymphadenopathy in the cervical, axillary or inguinal LUNGS: clear to auscultation and percussion with normal breathing effort HEART: regular rate & rhythm and no murmurs and no lower extremity edema: Occasional skipped beats ABDOMEN:abdomen soft, non-tender and normal bowel sounds Musculoskeletal:no cyanosis of digits and no clubbing  NEURO: alert & oriented x 3 with fluent speech, no focal motor/sensory deficits BREAST: No palpable masses lungs or nodules in either right or left breasts. No palpable axillary supraclavicular or infraclavicular adenopathy no breast tenderness or nipple discharge. Right breast scar is well healed mild tenderness to deep palpation   LABORATORY DATA:  I have reviewed the data as listed   Chemistry      Component Value Date/Time   NA 142 07/25/2014 1004   NA 140 03/25/2014 1855   K 4.6 07/25/2014 1004   K 4.6 03/25/2014 1855   CL 102 03/25/2014 1855   CL 106 10/24/2012 1255   CO2 28 07/25/2014 1004   CO2 25 03/25/2014 1855   BUN 21.9 07/25/2014 1004   BUN 15 03/25/2014 1855   CREATININE 0.9 07/25/2014 1004   CREATININE 1.19* 03/25/2014 1855       Component Value Date/Time   CALCIUM 10.1 07/25/2014 1004   CALCIUM 10.2 03/25/2014 1855   ALKPHOS 50 07/25/2014 1004   ALKPHOS 47 01/04/2012 1149   AST 35* 07/25/2014 1004   AST 20 01/04/2012 1149   ALT 32 07/25/2014 1004   ALT 13 01/04/2012 1149   BILITOT 0.60 07/25/2014 1004   BILITOT 0.4 01/04/2012 1149       Lab Results  Component Value Date   WBC 4.4 07/25/2014   HGB 13.5 07/25/2014   HCT 41.9 07/25/2014   MCV 95.3 07/25/2014   PLT 144* 07/25/2014   NEUTROABS 2.1 07/25/2014     RADIOGRAPHIC STUDIES: I have personally reviewed the radiology reports and agreed with their findings. No results found. mammograms done in April 2015 were reviewed and there were found to be  normal  ASSESSMENT & PLAN:  Cancer of upper-outer quadrant of female breast, Right T1aN0M0 Right breast cancer T1 A. N0 M0 stage IA status post lumpectomy and radiation: Patient has been on Evista which has antiestrogen properties and she has remained on it ever since. She reports no new problems or concerns. Her mammograms in April were reviewed in today's breast exam was also normal. Return to clinic in May 2016 after undergoing mammograms. I reviewed her blood work which was normal.  Discussed the importance of physical exercise in decreasing the likelihood of breast cancer recurrence. Recommended 30 mins daily 6 days a week of either brisk walking or cycling or swimming. Encouraged patient to eat more fruits and vegetables and decrease red meat.      Orders Placed This Encounter  Procedures  . MM Digital Diagnostic Bilat    Standing Status: Future     Number of Occurrences:      Standing Expiration Date: 08/29/2015    Order Specific Question:  Reason for Exam (SYMPTOM  OR DIAGNOSIS REQUIRED)    Answer:  History of breast cancer annual follow    Order Specific Question:  Preferred imaging location?    Answer:  [facility]  . CBC & Diff and Retic    Standing Status: Future     Number of Occurrences:      Standing  Expiration Date: 08/29/2015  . Comprehensive metabolic panel    Standing Status: Future     Number of Occurrences:      Standing Expiration Date: 08/29/2015   The patient has a good understanding of the overall plan. she agrees with it. She will call with any problems that may develop before her next visit here.  I spent 25 minutes counseling the patient face to face. The total time spent in the appointment was 30 minutes and more than 50% was on counseling and review of test results    Rulon Eisenmenger, MD 07/25/2014 10:57 AM

## 2014-07-25 NOTE — Assessment & Plan Note (Signed)
Right breast cancer T1 A. N0 M0 stage IA status post lumpectomy and radiation: Patient has been on Evista which has antiestrogen properties and she has remained on it ever since. She reports no new problems or concerns. Her mammograms in April were reviewed in today's breast exam was also normal. Return to clinic in May 2016 after undergoing mammograms. I reviewed her blood work which was normal.  Discussed the importance of physical exercise in decreasing the likelihood of breast cancer recurrence. Recommended 30 mins daily 6 days a week of either brisk walking or cycling or swimming. Encouraged patient to eat more fruits and vegetables and decrease red meat.

## 2014-07-29 ENCOUNTER — Other Ambulatory Visit: Payer: Self-pay | Admitting: Physical Medicine & Rehabilitation

## 2014-08-01 ENCOUNTER — Ambulatory Visit: Payer: PRIVATE HEALTH INSURANCE | Admitting: Oncology

## 2014-08-01 ENCOUNTER — Other Ambulatory Visit: Payer: PRIVATE HEALTH INSURANCE

## 2014-08-06 ENCOUNTER — Telehealth: Payer: Self-pay | Admitting: *Deleted

## 2014-08-06 NOTE — Telephone Encounter (Signed)
Pt left VM for nurse states she needs someone to call her back so she can update [place] with new address and phone number.  Her call back [phone]

## 2014-08-07 ENCOUNTER — Encounter: Payer: Self-pay | Admitting: Hematology and Oncology

## 2014-08-07 NOTE — Progress Notes (Signed)
Called the patient to update address and phone. We already have it updated.

## 2014-08-26 ENCOUNTER — Other Ambulatory Visit: Payer: Self-pay | Admitting: Physical Medicine & Rehabilitation

## 2014-09-25 ENCOUNTER — Other Ambulatory Visit: Payer: Self-pay | Admitting: Physical Medicine & Rehabilitation

## 2014-10-27 ENCOUNTER — Other Ambulatory Visit: Payer: PRIVATE HEALTH INSURANCE

## 2014-10-27 ENCOUNTER — Ambulatory Visit: Payer: PRIVATE HEALTH INSURANCE | Admitting: Oncology

## 2014-10-28 ENCOUNTER — Other Ambulatory Visit: Payer: Self-pay | Admitting: Physical Medicine & Rehabilitation

## 2014-11-23 ENCOUNTER — Other Ambulatory Visit: Payer: Self-pay | Admitting: Physical Medicine & Rehabilitation

## 2014-11-28 ENCOUNTER — Ambulatory Visit (HOSPITAL_BASED_OUTPATIENT_CLINIC_OR_DEPARTMENT_OTHER): Payer: Medicare Other | Admitting: Physical Medicine & Rehabilitation

## 2014-11-28 ENCOUNTER — Encounter: Payer: Self-pay | Admitting: Physical Medicine & Rehabilitation

## 2014-11-28 ENCOUNTER — Encounter: Payer: Medicare Other | Attending: Physical Medicine & Rehabilitation

## 2014-11-28 VITALS — BP 122/78 | HR 60 | Resp 14

## 2014-11-28 DIAGNOSIS — M25559 Pain in unspecified hip: Secondary | ICD-10-CM | POA: Insufficient documentation

## 2014-11-28 DIAGNOSIS — M7918 Myalgia, other site: Secondary | ICD-10-CM

## 2014-11-28 DIAGNOSIS — G5702 Lesion of sciatic nerve, left lower limb: Secondary | ICD-10-CM | POA: Insufficient documentation

## 2014-11-28 DIAGNOSIS — M791 Myalgia: Secondary | ICD-10-CM | POA: Insufficient documentation

## 2014-11-28 NOTE — Patient Instructions (Addendum)
Piriformis injection done under ultrasound guidance today using Celestone and lidocaine. We will see you back in 2 months if the injection wore off quickly and you have recurrent pain would recommend repeating injection using Botox  Continue stretching exercises Please resume walking

## 2014-11-28 NOTE — Progress Notes (Signed)
Subjective:    Patient ID: Sabrina Sullivan, female    DOB: 1941-02-04, 74 y.o.   MRN: [id number]  HPI Pain throughout the hip and buttock area. Has been moving from her home to another home and has not had much help from her children. She has not had any falls or any trauma. She points to her buttocks area and lateral hip area as well as her groin area.  Other significant past medical history is for interstitial cystitis as well as lumbar spondylosis  She has responded to piriformis injection under ultrasound guidance performed one year ago with Celestone and lidocaine. This duration of response was at least one month but was worn off by 6 months. She cannot recall exactly how long it helped her for.   Pain Inventory Average Pain 9 Pain Right Now 9 My pain is constant, sharp, burning, stabbing, tingling and aching  In the last 24 hours, has pain interfered with the following? General activity 7 Relation with others 7 Enjoyment of life 10 What TIME of day is your pain at its worst? evening and night Sleep (in general) Fair  Pain is worse with: walking, bending, sitting, standing and some activites Pain improves with: heat/ice Relief from Meds: 6  Mobility walk without assistance ability to climb steps?  yes do you drive?  yes  Function retired  Neuro/Psych spasms  Prior Studies Any changes since last visit?  no  Physicians involved in your care Any changes since last visit?  no   Family History  Problem Relation Age of Onset  . Cancer Mother     uterine  . Cancer Maternal Uncle     lung  . Cancer Daughter     breast   History   Social History  . Marital Status: Divorced    Spouse Name: N/A    Number of Children: N/A  . Years of Education: N/A   Occupational History  . Retired/Disability    Social History Main Topics  . Smoking status: Former Smoker -- 0.50 packs/day    Quit date: 01/22/2008  . Smokeless tobacco: Never Used  . Alcohol Use: Yes  .  Drug Use: No  . 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 None   Social History Narrative   Past Surgical History  Procedure Laterality Date  . Cervical fusion    . Rotator cuff repair    . Replacement total knee    . Joint replacement    . Tonsillectomy    . Breast lumpectomy  02/02/2012    right  . Abdominal hysterectomy  1970    has ovaries  . Esophageal manometry N/A 04/07/2014    Procedure: ESOPHAGEAL MANOMETRY (EM);  Surgeon: Garlan Fair, MD;  Location: [facility];  Service: Endoscopy;  Laterality: N/A;   Past Medical History  Diagnosis Date  . Rosacea   . High cholesterol   . Low back pain   . Left arm pain   . Neck pain   . Thoracic back pain   . Complication of anesthesia 2006    POST OP HALLUCINATIONS AND CONFUSIION /"MAYBE SOMETHING FOR PAIN "  . GERD (gastroesophageal reflux disease)   . 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 Ear disorder     TINNITIS  . Urine frequency     AND PT GETS UP AT NIGHT  . Dental crowns present   . Cancer  breast- right  . History of radiation therapy 03/22/12-04/19/12    right breast   BP 122/78 mmHg  Pulse 60  Resp 14  SpO2 99%  Opioid Risk Score:   Fall Risk Score: Low Fall Risk (0-5 points)  Review of Systems  Constitutional: Positive for unexpected weight change.       Weight loss  HENT: Negative.   Eyes: Negative.   Respiratory: Negative.   Cardiovascular: Negative.   Gastrointestinal: Negative.   Endocrine: Negative.   Genitourinary: Negative.   Musculoskeletal: Positive for myalgias, back pain and arthralgias.  Skin: Negative.   Allergic/Immunologic: Negative.   Neurological: Negative.   Hematological: Negative.   Psychiatric/Behavioral: Positive for dysphoric mood.       Objective:   Physical Exam  Tenderness to palpation bilateral greater trochanters. No pain with hip range of motion Tenderness palpation left piriformis area Tenderness to  palpation in the left ischial tuberosity.      Assessment & Plan:  1. Diffuse pelvic pain she has a history of interstitial cystitis, bilateral trochanteric bursitis left issue bursitis as well as piriformis syndrome of the left side. She is to continue her tramadol and  gabapentin And Zanaflex 2. Piriformis syndrome without Sciatic component Given flareup related to increased activity level will reinject piriformis  Indication left piriformis syndrome which has not responded to physical therapy or medication management  Informed consent was obtained after the patient reviewed and anatomy and potential risks as well as potential benefits. She  has given written consent Patient placed prone on exam table area pre-scan and with curvilinear 4 Hz transducer starting at the PSIS then identifying the PI IS and then orienting transducer along the long axis of the piriformis muscle. Area was marked and prepped with Betadine and draped in a sterile manner. 3 cc of lidocaine were infiltrate into the skin and subcutaneous tissue using a 25-gauge 1.5 inch needle. Then a 80 mm echo block needle was inserted under ultrasound guidance targeting the piriformis muscle. Needle tip entered piriformis muscle then a solution containing one ML of Celestone 6 mg +4 ML of 1% lidocaine was injected. Patient tolerated procedure well  Post procedure noted immediate reduction in left buttocks pain

## 2014-12-04 ENCOUNTER — Encounter (HOSPITAL_COMMUNITY): Payer: Self-pay | Admitting: Gastroenterology

## 2014-12-04 ENCOUNTER — Other Ambulatory Visit: Payer: Self-pay | Admitting: *Deleted

## 2014-12-05 ENCOUNTER — Other Ambulatory Visit: Payer: Self-pay | Admitting: Physical Medicine & Rehabilitation

## 2014-12-11 ENCOUNTER — Ambulatory Visit: Payer: PRIVATE HEALTH INSURANCE | Admitting: Physical Medicine & Rehabilitation

## 2014-12-16 ENCOUNTER — Other Ambulatory Visit: Payer: Self-pay | Admitting: *Deleted

## 2014-12-16 ENCOUNTER — Other Ambulatory Visit: Payer: Self-pay | Admitting: Physical Medicine & Rehabilitation

## 2014-12-16 MED ORDER — TRAMADOL HCL 50 MG PO TABS: 50.0000 mg | ORAL_TABLET | Freq: Two times a day (BID) | ORAL | Status: DC | PRN

## 2014-12-16 NOTE — Telephone Encounter (Signed)
recvd electronic refill request for Tramadol 50mg  #60 #RF 2.  Called into the pharmacy

## 2014-12-26 ENCOUNTER — Encounter: Payer: Medicare Other | Attending: Physical Medicine & Rehabilitation

## 2014-12-26 ENCOUNTER — Encounter: Payer: Self-pay | Admitting: Physical Medicine & Rehabilitation

## 2014-12-26 ENCOUNTER — Ambulatory Visit (HOSPITAL_BASED_OUTPATIENT_CLINIC_OR_DEPARTMENT_OTHER): Payer: Medicare Other | Admitting: Physical Medicine & Rehabilitation

## 2014-12-26 VITALS — BP 134/66 | HR 76 | Resp 14

## 2014-12-26 DIAGNOSIS — M791 Myalgia: Secondary | ICD-10-CM | POA: Insufficient documentation

## 2014-12-26 DIAGNOSIS — G5702 Lesion of sciatic nerve, left lower limb: Secondary | ICD-10-CM | POA: Insufficient documentation

## 2014-12-26 DIAGNOSIS — M25559 Pain in unspecified hip: Secondary | ICD-10-CM | POA: Insufficient documentation

## 2014-12-26 DIAGNOSIS — M7061 Trochanteric bursitis, right hip: Secondary | ICD-10-CM

## 2014-12-26 DIAGNOSIS — M7062 Trochanteric bursitis, left hip: Secondary | ICD-10-CM

## 2014-12-26 NOTE — Patient Instructions (Signed)
Trochanteric Bursitis You have hip pain due to trochanteric bursitis. Bursitis means that the sack near the outside of the hip is filled with fluid and inflamed. This sack is made up of protective soft tissue. The pain from trochanteric bursitis can be severe and keep you from sleep. It can radiate to the buttocks or down the outside of the thigh to the knee. The pain is almost always worse when rising from the seated or lying position and with walking. Pain can improve after you take a few steps. It happens more often in people with hip joint and lumbar spine problems, such as arthritis or previous surgery. Very rarely the trochanteric bursa can become infected, and antibiotics and/or surgery may be needed. Treatment often includes an injection of local anesthetic mixed with cortisone medicine. This medicine is injected into the area where it is most tender over the hip. Repeat injections may be necessary if the response to treatment is slow. You can apply ice packs over the tender area for 30 minutes every 2 hours for the next few days. Anti-inflammatory and/or narcotic pain medicine may also be helpful. Limit your activity for the next few days if the pain continues. See your caregiver in 5-10 days if you are not greatly improved.  SEEK IMMEDIATE MEDICAL CARE IF:  You develop severe pain, fever, or increased redness.  You have pain that radiates below the knee. EXERCISES STRETCHING EXERCISES - Trochanteric Bursitis  These exercises may help you when beginning to rehabilitate your injury. Your symptoms may resolve with or without further involvement from your physician, physical therapist, or athletic trainer. While completing these exercises, remember:   Restoring tissue flexibility helps normal motion to return to the joints. This allows healthier, less painful movement and activity.  An effective stretch should be held for at least 30 seconds.  A stretch should never be painful. You should only  feel a gentle lengthening or release in the stretched tissue. STRETCH - Iliotibial Band  On the floor or bed, lie on your side so your injured leg is on top. Bend your knee and grab your ankle.  Slowly bring your knee back so that your thigh is in line with your trunk. Keep your heel at your buttocks and gently arch your back so your head, shoulders and hips line up.  Slowly lower your leg so that your knee approaches the floor/bed until you feel a gentle stretch on the outside of your thigh. If you do not feel a stretch and your knee will not fall farther, place the heel of your opposite foot on top of your knee and pull your thigh down farther.  Hold this stretch for __________ seconds.  Repeat __________ times. Complete this exercise __________ times per day. STRETCH - Hamstrings, Supine   Lie on your back. Loop a belt or towel over the ball of your foot as shown.  Straighten your knee and slowly pull on the belt to raise your injured leg. Do not allow the knee to bend. Keep your opposite leg flat on the floor.  Raise the leg until you feel a gentle stretch behind your knee or thigh. Hold this position for __________ seconds.  Repeat __________ times. Complete this stretch __________ times per day. STRETCH - Quadriceps, Prone   Lie on your stomach on a firm surface, such as a bed or padded floor.  Bend your knee and grasp your ankle. If you are unable to reach your ankle or pant leg, use a belt   around your foot to lengthen your reach.  Gently pull your heel toward your buttocks. Your knee should not slide out to the side. You should feel a stretch in the front of your thigh and/or knee.  Hold this position for __________ seconds.  Repeat __________ times. Complete this stretch __________ times per day. STRETCHING - Hip Flexors, Lunge Half kneel with your knee on the floor and your opposite knee bent and directly over your ankle.  Keep good posture with your head over your  shoulders. Tighten your buttocks to point your tailbone downward; this will prevent your back from arching too much.  You should feel a gentle stretch in the front of your thigh and/or hip. If you do not feel any resistance, slightly slide your opposite foot forward and then slowly lunge forward so your knee once again lines up over your ankle. Be sure your tailbone remains pointed downward.  Hold this stretch for __________ seconds.  Repeat __________ times. Complete this stretch __________ times per day. STRETCH - Adductors, Lunge  While standing, spread your legs.  Lean away from your injured leg by bending your opposite knee. You may rest your hands on your thigh for balance.  You should feel a stretch in your inner thigh. Hold for __________ seconds.  Repeat __________ times. Complete this exercise __________ times per day. Document Released: 12/15/2004 Document Revised: 03/24/2014 Document Reviewed: 02/19/2009 Exit[employer] Patient Information 2015 [employer]. This information is not intended to replace advice given to you by your health care provider. Make sure you discuss any questions you have with your health care provider.  

## 2014-12-26 NOTE — Progress Notes (Signed)
Subjective:    Patient ID: Sabrina Sullivan, female    DOB: May 03, 1941, 74 y.o.   MRN: [id number]  HPI   Pain Inventory Average Pain 8 Pain Right Now 8 My pain is constant, sharp, burning and aching  In the last 24 hours, has pain interfered with the following? General activity 7 Relation with others 7 Enjoyment of life 9 What TIME of day is your pain at its worst? daytime, evening, night Sleep (in general) Poor  Pain is worse with: walking, sitting, standing and some activites Pain improves with: heat/ice, medication and injections Relief from Meds: 6  Mobility walk without assistance how many minutes can you walk? varies ability to climb steps?  yes do you drive?  yes  Function disabled: date disabled . retired  Neuro/Psych depression  Prior Studies Any changes since last visit?  no  Physicians involved in your care Any changes since last visit?  no   Family History  Problem Relation Age of Onset  . Cancer Mother     uterine  . Cancer Maternal Uncle     lung  . Cancer Daughter     breast   History   Social History  . Marital Status: Divorced    Spouse Name: N/A    Number of Children: N/A  . Years of Education: N/A   Occupational History  . Retired/Disability    Social History Main Topics  . Smoking status: Former Smoker -- 0.50 packs/day    Quit date: 01/22/2008  . Smokeless tobacco: Never Used  . Alcohol Use: Yes  . Drug Use: No  . 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 None   Social History Narrative   Past Surgical History  Procedure Laterality Date  . Cervical fusion    . Rotator cuff repair    . Replacement total knee    . Joint replacement    . Tonsillectomy    . Breast lumpectomy  02/02/2012    right  . Abdominal hysterectomy  1970    has ovaries  . Esophageal manometry N/A 04/07/2014    Procedure: ESOPHAGEAL MANOMETRY (EM);  Surgeon: Garlan Fair, MD;  Location: [facility];  Service: Endoscopy;  Laterality: N/A;   Past Medical History  Diagnosis Date  . Rosacea   . High cholesterol   . Low back pain   . Left arm pain   . Neck pain   . Thoracic back pain   . Complication of anesthesia 2006    POST OP HALLUCINATIONS AND CONFUSIION /"MAYBE SOMETHING FOR PAIN "  . GERD (gastroesophageal reflux disease)   . 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 Ear disorder     TINNITIS  . Urine frequency     AND PT GETS UP AT NIGHT  . Dental crowns present   . Cancer     breast- right  . History of radiation therapy 03/22/12-04/19/12    right breast   BP 134/66 mmHg  Pulse 76  Resp 14  SpO2 98%  Opioid Risk Score:   Fall Risk Score: Low Fall Risk (0-5 points) (patient previously  educated)  Review of Systems  Psychiatric/Behavioral: Positive for dysphoric mood.  All other systems reviewed and are negative.      Objective:   Physical Exam        Assessment & Plan:    Trochanteric bursa injection-Bilateral Without ultrasound guidance  Indication Trochanteric  bursitis. Exam has tenderness over the greater trochanter of the hip. Pain has not responded to conservative care such as exercise therapy and oral medications. Pain interferes with sleep or with mobility Informed consent was obtained after describing risks and benefits of the procedure with the patient these include bleeding bruising and infection. Patient has signed written consent form. Patient placed in a lateral decubitus position with the affected hip superior. Point of maximal pain was palpated marked and prepped with Betadine and entered with a needle to bone contact. Needle slightly withdrawn then 22m of betamethasone with 4 cc 1% lidocaine were injected. Patient tolerated procedure well. Post procedure instructions given. Bilateral procedure

## 2015-01-02 ENCOUNTER — Other Ambulatory Visit: Payer: Self-pay | Admitting: Physical Medicine & Rehabilitation

## 2015-02-18 ENCOUNTER — Other Ambulatory Visit: Payer: Self-pay | Admitting: Gastroenterology

## 2015-02-23 ENCOUNTER — Ambulatory Visit (HOSPITAL_COMMUNITY): Payer: Medicare Other | Admitting: Certified Registered Nurse Anesthetist

## 2015-02-23 ENCOUNTER — Encounter (HOSPITAL_COMMUNITY)
Admission: RE | Disposition: A | Discharge status: Home / Self Care | Payer: Self-pay | Source: Ambulatory Visit | Attending: Gastroenterology

## 2015-02-23 ENCOUNTER — Ambulatory Visit (HOSPITAL_COMMUNITY)
Admission: RE | Admit: 2015-02-23 | Discharge: 2015-02-23 | Disposition: A | Discharge status: Home / Self Care | Payer: Medicare Other | Source: Ambulatory Visit | Attending: Gastroenterology | Admitting: Gastroenterology

## 2015-02-23 ENCOUNTER — Encounter (HOSPITAL_COMMUNITY): Payer: Self-pay | Admitting: Certified Registered Nurse Anesthetist

## 2015-02-23 DIAGNOSIS — K224 Dyskinesia of esophagus: Secondary | ICD-10-CM | POA: Diagnosis not present

## 2015-02-23 DIAGNOSIS — K589 Irritable bowel syndrome without diarrhea: Secondary | ICD-10-CM | POA: Insufficient documentation

## 2015-02-23 DIAGNOSIS — K219 Gastro-esophageal reflux disease without esophagitis: Secondary | ICD-10-CM | POA: Insufficient documentation

## 2015-02-23 DIAGNOSIS — K449 Diaphragmatic hernia without obstruction or gangrene: Secondary | ICD-10-CM | POA: Insufficient documentation

## 2015-02-23 DIAGNOSIS — M179 Osteoarthritis of knee, unspecified: Secondary | ICD-10-CM | POA: Diagnosis not present

## 2015-02-23 DIAGNOSIS — R1310 Dysphagia, unspecified: Secondary | ICD-10-CM | POA: Diagnosis present

## 2015-02-23 HISTORY — PX: BOTOX INJECTION: SHX5754

## 2015-02-23 HISTORY — PX: ESOPHAGOGASTRODUODENOSCOPY: SHX5428

## 2015-02-23 SURGERY — EGD (ESOPHAGOGASTRODUODENOSCOPY)
Anesthesia: Monitor Anesthesia Care

## 2015-02-23 MED ORDER — ONABOTULINUMTOXINA 100 UNITS IJ SOLR
100.0000 [IU] | Freq: Once | INTRAMUSCULAR | Status: AC
  Administered 2015-02-23: 100 [IU] via SUBMUCOSAL
  Filled 2015-02-23: qty 100

## 2015-02-23 MED ORDER — PROPOFOL 10 MG/ML IV BOLUS
INTRAVENOUS | Status: DC | PRN
  Administered 2015-02-23: 20 mg via INTRAVENOUS
  Administered 2015-02-23 (×2): 10 mg via INTRAVENOUS

## 2015-02-23 MED ORDER — ONDANSETRON HCL 4 MG/2ML IJ SOLN
INTRAMUSCULAR | Status: DC | PRN
  Administered 2015-02-23: 4 mg via INTRAVENOUS

## 2015-02-23 MED ORDER — PROPOFOL INFUSION 10 MG/ML OPTIME
INTRAVENOUS | Status: DC | PRN
  Administered 2015-02-23: 160 ug/kg/min via INTRAVENOUS

## 2015-02-23 MED ORDER — LACTATED RINGERS IV SOLN
INTRAVENOUS | Status: DC
  Administered 2015-02-23: 13:00:00 via INTRAVENOUS

## 2015-02-23 MED ORDER — PROPOFOL 10 MG/ML IV BOLUS
INTRAVENOUS | Status: AC
  Filled 2015-02-23: qty 20

## 2015-02-23 MED ORDER — LIDOCAINE HCL (CARDIAC) 20 MG/ML IV SOLN
INTRAVENOUS | Status: DC | PRN
  Administered 2015-02-23: 40 mg via INTRAVENOUS

## 2015-02-23 MED ORDER — BUTAMBEN-TETRACAINE-BENZOCAINE 2-2-14 % EX AERO
INHALATION_SPRAY | CUTANEOUS | Status: DC | PRN
  Administered 2015-02-23: 1 via TOPICAL

## 2015-02-23 MED ORDER — LIDOCAINE HCL (CARDIAC) 20 MG/ML IV SOLN
INTRAVENOUS | Status: AC
  Filled 2015-02-23: qty 5

## 2015-02-23 MED ORDER — ONDANSETRON HCL 4 MG/2ML IJ SOLN
INTRAMUSCULAR | Status: AC
  Filled 2015-02-23: qty 2

## 2015-02-23 NOTE — Discharge Instructions (Signed)
Esophagogastroduodenoscopy °Care After °Refer to this sheet in the next few weeks. These instructions provide you with information on caring for yourself after your procedure. Your caregiver may also give you more specific instructions. Your treatment has been planned according to current medical practices, but problems sometimes occur. Call your caregiver if you have any problems or questions after your procedure.  °HOME CARE INSTRUCTIONS °· Do not eat or drink anything until the numbing medicine (local anesthetic) has worn off and your gag reflex has returned. You will know that the local anesthetic has worn off when you can swallow comfortably. °· Do not drive for 12 hours after the procedure or as directed by your caregiver. °· Only take medicines as directed by your caregiver. °SEEK MEDICAL CARE IF:  °· You cannot stop coughing. °· You are not urinating at all or less than usual. °SEEK IMMEDIATE MEDICAL CARE IF: °· You have difficulty swallowing. °· You cannot eat or drink. °· You have worsening throat or chest pain. °· You have dizziness, lightheadedness, or you faint. °· You have nausea or vomiting. °· You have chills. °· You have a fever. °· You have severe abdominal pain. °· You have black, tarry, or bloody stools. °Document Released: 10/24/2012 Document Reviewed: 10/24/2012 °ExitCare® Patient Information ©2015 [employer]. This information is not intended to replace advice given to you by your health care provider. Make sure you discuss any questions you have with your health care provider. ° °

## 2015-02-23 NOTE — H&P (Signed)
  Problem: Esophageal dysphagia due to esophageal dysmotility  History: The patient is a 75 year old female born [birth year]. Intermittently, she experiences solid food esophageal dysphagia. She underwent a normal esophagogastroduodenoscopy in June 2010. She underwent a normal barium esophagram with barium tablet swallow in February 2014. She underwent a normal esophagogastroduodenoscopy with esophageal biopsies in March 2014.  On 03/27/2014 she underwent a diagnostic esophagogastroduodenoscopy which showed a medium sized hiatal hernia associated with linear erosions in the gastric body appetite asthmatic hiatus.  On 04/08/2014 she underwent a high resolution esophageal manometry which showed normal upper esophageal sphincter function, incomplete relaxation of the lower esophageal sphincter with normal basal pressure, and 20% of swallows associated with incomplete bolus clearance from the esophagus.  She was prescribed when necessary sublingual nitroglycerin which controlled her esophageal dysphagia for a time.  She is scheduled to undergo esophagogastroduodenoscopy with Botox injection into the lower esophageal sphincter muscle today.  Past medical history: 06/20/2005 normal colonoscopy; random colonic mucosal biopsies showed microscopic colitis. Left colonic diverticulosis. Hysterectomy. Appendectomy. Endometriosis. Abdominal laparoscopy with adhesion lysis in 1991 and 1994. Irritable bowel syndrome. Hysterectomy. Osteoarthritis of the knee. Arthroscopic right knee surgery. Right rotator cuff surgery. Cervical disc surgery.  Medication allergies: Morphine  Exam: The patient is alert and lying comfortably on the endoscopy stretcher. Abdomen is soft and nontender to palpation. Lungs are clear to auscultation. Cardiac exam reveals a regular rhythm.  Plan: Proceed with esophagogastroduodenoscopy and Botox injection into the lower esophageal sphincter muscle to treat esophageal dysmotility.

## 2015-02-23 NOTE — Transfer of Care (Signed)
Immediate Anesthesia Transfer of Care Note  Patient: Sabrina Sullivan  Procedure(s) Performed: Procedure(s) (LRB): ESOPHAGOGASTRODUODENOSCOPY (EGD) (N/A) BOTOX INJECTION (N/A)  Patient Location: PACU  Anesthesia Type: MAC  Level of Consciousness: sedated, patient cooperative and responds to stimulation  Airway & Oxygen Therapy: Patient Spontanous Breathing and Patient connected to face mask oxgen  Post-op Assessment: Report given to PACU RN and Post -op Vital signs reviewed and stable  Post vital signs: Reviewed and stable  Complications: No apparent anesthesia complications

## 2015-02-23 NOTE — Anesthesia Postprocedure Evaluation (Signed)
  Anesthesia Post-op Note  Patient: Sabrina Sullivan  Procedure(s) Performed: Procedure(s) (LRB): ESOPHAGOGASTRODUODENOSCOPY (EGD) (N/A) BOTOX INJECTION (N/A)  Patient Location: PACU  Anesthesia Type: MAC  Level of Consciousness: awake and alert   Airway and Oxygen Therapy: Patient Spontanous Breathing  Post-op Pain: mild  Post-op Assessment: Post-op Vital signs reviewed, Patient's Cardiovascular Status Stable, Respiratory Function Stable, Patent Airway and No signs of Nausea or vomiting  Last Vitals:  Filed Vitals:   02/23/15 1317  BP: 158/87  Pulse: 76  Temp: 36.7 C  Resp: 14    Post-op Vital Signs: stable   Complications: No apparent anesthesia complications

## 2015-02-23 NOTE — Op Note (Signed)
Problem: Esophageal dysphagia associated with esophageal dysmotility. 03/27/2014 diagnostic esophagogastroduodenoscopy showed a moderate sized hiatal hernia associated with linear erosions in the gastric body at the diaphragmatic hiatus. Esophageal biopsies performed in March 2014 did not show eosinophilic esophagitis. 04/08/2014 high resolution esophageal manometry showed normal upper esophageal sphincter function, incomplete relaxation of the lower esophageal sphincter with normal basal pressure, and 20% of swallows associated with incomplete esophageal bolus clearance.  Endoscopist: Earle Gell  Premedication: Propofol administered by anesthesia  Procedure: Diagnostic esophagogastroduodenoscopy with Botox injection into the lower esophageal sphincter muscle The patient was placed in the left lateral decubitus position. The Pentax gastroscope was passed through the posterior hypopharynx into the proximal esophagus without difficulty. The hypopharynx, larynx, and vocal cords appeared completely normal.  Esophagoscopy: The proximal, mid, and lower segments of the esophageal mucosa appeared normal. The squamocolumnar junction was noted at 35 cm from the incisor teeth. I felt some resistance with passage of the Pentax gastroscope through the esophagogastric junction into the proximal stomach. Using the esophageal balloon dilator, the balloon was inflated from 15 mm to 18 mm without mucosal disruption indicating the absence of an esophageal stricture at the esophagogastric junction. A total of 100 units of Botox was injected into the lower esophageal sphincter; four quadrant injections with 25 units each.  Gastroscopy: There was a moderate sized hiatal hernia. Retroflexed view of the gastric cardia and fundus was normal. The gastric body, antrum, and pylorus appeared normal.  Duodenoscopy: The duodenal bulb and descending duodenum appeared normal.  Assessment: Esophageal dysphagia due to esophageal  dysmotility. 100 units of Botox was injected into the lower esophageal sphincter muscle.

## 2015-02-23 NOTE — Anesthesia Preprocedure Evaluation (Addendum)
Anesthesia Evaluation  Patient identified by MRN, date of birth, ID band Patient awake    Reviewed: Allergy & Precautions, NPO status , Patient's Chart, lab work & pertinent test results  History of Anesthesia Complications (+) history of anesthetic complications  Airway Mallampati: II  TM Distance: >3 FB Neck ROM: Full    Dental no notable dental hx. (+) Dental Advisory Given   Pulmonary former smoker,  breath sounds clear to auscultation  Pulmonary exam normal       Cardiovascular negative cardio ROS  Rhythm:Regular Rate:Normal     Neuro/Psych PSYCHIATRIC DISORDERS Anxiety negative neurological ROS     GI/Hepatic Neg liver ROS, GERD-  Medicated and Controlled,  Endo/Other  negative endocrine ROS  Renal/GU negative Renal ROS  negative genitourinary   Musculoskeletal  (+) Arthritis -, Osteoarthritis,    Abdominal   Peds negative pediatric ROS (+)  Hematology negative hematology ROS (+)   Anesthesia Other Findings   Reproductive/Obstetrics negative OB ROS                            Anesthesia Physical Anesthesia Plan  ASA: II  Anesthesia Plan: MAC   Post-op Pain Management:    Induction: Intravenous  Airway Management Planned: Nasal Cannula  Additional Equipment:   Intra-op Plan:   Post-operative Plan:   Informed Consent: I have reviewed the patients History and Physical, chart, labs and discussed the procedure including the risks, benefits and alternatives for the proposed anesthesia with the patient or authorized representative who has indicated his/her understanding and acceptance.   Dental advisory given  Plan Discussed with: CRNA  Anesthesia Plan Comments:         Anesthesia Quick Evaluation

## 2015-02-24 ENCOUNTER — Ambulatory Visit: Payer: Medicare Other | Admitting: Physical Medicine & Rehabilitation

## 2015-02-24 ENCOUNTER — Encounter (HOSPITAL_COMMUNITY): Payer: Self-pay | Admitting: Gastroenterology

## 2015-02-25 ENCOUNTER — Telehealth: Payer: Self-pay | Admitting: *Deleted

## 2015-02-25 MED ORDER — TIZANIDINE HCL 4 MG PO TABS: 4.0000 mg | ORAL_TABLET | Freq: Every day | ORAL | Status: DC

## 2015-02-25 NOTE — Telephone Encounter (Signed)
Patient called stating that the [facility] in [place] sent in 2 refill requests for her Baclofen. Checked the refill requests and we have NOT recd a refill request.  Will call pharmacy and give verbal auth for refill.  Spoke with pharmacist and filled with 2 refills.

## 2015-03-02 ENCOUNTER — Other Ambulatory Visit: Payer: Self-pay | Admitting: Hematology and Oncology

## 2015-03-02 ENCOUNTER — Ambulatory Visit
Admission: RE | Admit: 2015-03-02 | Discharge: 2015-03-02 | Disposition: A | Discharge status: Home / Self Care | Payer: Medicare Other | Source: Ambulatory Visit | Attending: Hematology and Oncology | Admitting: Hematology and Oncology

## 2015-03-02 ENCOUNTER — Ambulatory Visit
Admission: RE | Admit: 2015-03-02 | Discharge: 2015-03-02 | Disposition: A | Discharge status: Home / Self Care | Payer: Medicare Other | Source: Ambulatory Visit | Attending: Obstetrics and Gynecology | Admitting: Obstetrics and Gynecology

## 2015-03-02 DIAGNOSIS — M8580 Other specified disorders of bone density and structure, unspecified site: Secondary | ICD-10-CM

## 2015-03-02 DIAGNOSIS — C50419 Malignant neoplasm of upper-outer quadrant of unspecified female breast: Secondary | ICD-10-CM

## 2015-03-05 ENCOUNTER — Other Ambulatory Visit (HOSPITAL_BASED_OUTPATIENT_CLINIC_OR_DEPARTMENT_OTHER): Payer: Medicare Other

## 2015-03-05 ENCOUNTER — Telehealth: Payer: Self-pay | Admitting: Hematology and Oncology

## 2015-03-05 ENCOUNTER — Ambulatory Visit (HOSPITAL_BASED_OUTPATIENT_CLINIC_OR_DEPARTMENT_OTHER): Payer: Medicare Other | Admitting: Hematology and Oncology

## 2015-03-05 VITALS — BP 130/76 | HR 69 | Temp 97.9°F | Resp 18 | Ht 64.0 in | Wt 122.9 lb

## 2015-03-05 DIAGNOSIS — C50419 Malignant neoplasm of upper-outer quadrant of unspecified female breast: Secondary | ICD-10-CM

## 2015-03-05 DIAGNOSIS — Z170 Estrogen receptor positive status [ER+]: Secondary | ICD-10-CM

## 2015-03-05 DIAGNOSIS — C50411 Malignant neoplasm of upper-outer quadrant of right female breast: Secondary | ICD-10-CM | POA: Diagnosis not present

## 2015-03-05 LAB — CBC & DIFF AND RETIC
BASO%: 1.1 % (ref 0.0–2.0)
Basophils Absolute: 0 10*3/uL (ref 0.0–0.1)
EOS%: 2.4 % (ref 0.0–7.0)
Eosinophils Absolute: 0.1 10*3/uL (ref 0.0–0.5)
HCT: 43.1 % (ref 34.8–46.6)
HGB: 14.2 g/dL (ref 11.6–15.9)
IMMATURE RETIC FRACT: 3.3 % (ref 1.60–10.00)
LYMPH#: 1.4 10*3/uL (ref 0.9–3.3)
LYMPH%: 37.8 % (ref 14.0–49.7)
MCH: 31.8 pg (ref 25.1–34.0)
MCHC: 32.9 g/dL (ref 31.5–36.0)
MCV: 96.6 fL (ref 79.5–101.0)
MONO#: 0.4 10*3/uL (ref 0.1–0.9)
MONO%: 10.1 % (ref 0.0–14.0)
NEUT#: 1.8 10*3/uL (ref 1.5–6.5)
NEUT%: 48.6 % (ref 38.4–76.8)
Platelets: 118 10*3/uL — ABNORMAL LOW (ref 145–400)
RBC: 4.46 10*6/uL (ref 3.70–5.45)
RDW: 13.4 % (ref 11.2–14.5)
Retic %: 1.08 % (ref 0.70–2.10)
Retic Ct Abs: 48.17 10*3/uL (ref 33.70–90.70)
WBC: 3.8 10*3/uL — ABNORMAL LOW (ref 3.9–10.3)

## 2015-03-05 LAB — COMPREHENSIVE METABOLIC PANEL (CC13)
ALT: 14 U/L (ref 0–55)
ANION GAP: 8 meq/L (ref 3–11)
AST: 22 U/L (ref 5–34)
Albumin: 3.8 g/dL (ref 3.5–5.0)
Alkaline Phosphatase: 47 U/L (ref 40–150)
BILIRUBIN TOTAL: 0.53 mg/dL (ref 0.20–1.20)
BUN: 19 mg/dL (ref 7.0–26.0)
CALCIUM: 9.6 mg/dL (ref 8.4–10.4)
CO2: 27 mEq/L (ref 22–29)
Chloride: 107 mEq/L (ref 98–109)
Creatinine: 0.9 mg/dL (ref 0.6–1.1)
EGFR: 65 mL/min/{1.73_m2} — ABNORMAL LOW (ref >90)
Glucose: 76 mg/dl (ref 70–140)
Potassium: 4.5 mEq/L (ref 3.5–5.1)
Sodium: 142 mEq/L (ref 136–145)
TOTAL PROTEIN: 6.6 g/dL (ref 6.4–8.3)

## 2015-03-05 NOTE — Assessment & Plan Note (Signed)
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   Breast cancer surveillance: 1. Mammogram 02/23/2015 is normal breast density seen 2. Breast exam 03/05/2015 is normal  Return to clinic in 1 year for follow-up and breast exams

## 2015-03-05 NOTE — Telephone Encounter (Signed)
Called and left a message with 1year appointment

## 2015-03-05 NOTE — Progress Notes (Signed)
Patient Care Team: Jilda Panda, MD as PCP - General (Internal Medicine)  DIAGNOSIS: Cancer of upper-outer quadrant of female breast, Right T1aN0M0   Staging form: Breast, AJCC 7th Edition     Clinical: Stage IA (T1a, N0, cM0) - Unsigned     Staging comments: Staged in Breast Conference 01/04/12      Pathologic: No stage assigned - Unsigned   SUMMARY OF ONCOLOGIC HISTORY:   Cancer of upper-outer quadrant of female breast, Right T1aN0M0   01/04/2012 Initial Diagnosis Cancer of upper-outer quadrant of female breast, Right T1aN0M0   02/02/2012 Surgery Right breast lumpectomy with SLN biopsy, IDC low-grade 0.7 cm with intermediate grade DCIS, 2 SLN negative ER 65%, PR negative, HER-2 negative, Ki-67 12%   03/26/2012 - 04/26/2012 Radiation Therapy Radiation therapy to lumpectomy site by Dr. Gery Pray    Anti-estrogen oral therapy Patient declined antiestrogen therapy    CHIEF COMPLIANT: Follow-up of breast cancer    INTERVAL HISTORY: Sabrina Sullivan is a 74 year old lady with above-mentioned history of right breast cancer treated with lumpectomy and radiation and had declined antiestrogen therapy and is here for six-month follow-up. She reports occasional right breast pain but otherwise doing well. She is slowly gaining weight. She is still emotionally depressed because of her grandchildren.  REVIEW OF SYSTEMS:   Constitutional: Denies fevers, chills or abnormal weight loss Eyes: Denies blurriness of vision Ears, nose, mouth, throat, and face: Denies mucositis or sore throat Respiratory: Denies cough, dyspnea or wheezes Cardiovascular: Denies palpitation, chest discomfort or lower extremity swelling Gastrointestinal:  Denies nausea, heartburn or change in bowel habits Skin: Denies abnormal skin rashes Lymphatics: Denies new lymphadenopathy or easy bruising Neurological:Denies numbness, tingling or new weaknesses Behavioral/Psych: Mood is stable, no new changes  Breast:  denies any pain or lumps  or nodules in either breasts All other systems were reviewed with the patient and are negative.  I have reviewed the past medical history, past surgical history, social history and family history with the patient and they are unchanged from previous note.  ALLERGIES:  is allergic to hydrocodone; morphine; and morphine and related.  MEDICATIONS:  Current Outpatient Prescriptions  Medication Sig Dispense Refill  . ALPRAZolam (XANAX) 0.5 MG tablet Take 0.5 mg by mouth 2 (two) times daily.    . calcium citrate (CALCITRATE - DOSED IN MG ELEMENTAL CALCIUM) 950 MG tablet Take 1 tablet by mouth daily.    . cholecalciferol (VITAMIN D) 1000 UNITS tablet Take 2,000 Units by mouth daily.     Marland Kitchen ELMIRON 100 MG capsule     . fish oil-omega-3 fatty acids 1000 MG capsule Take 2 g by mouth daily.     Marland Kitchen gabapentin (NEURONTIN) 100 MG capsule Take 100 mg by mouth 2 (two) times daily.    . Magnesium 500 MG CAPS Take 1,000 mg by mouth daily.    Marland Kitchen omeprazole (PRILOSEC) 20 MG capsule Take 20 mg by mouth as needed (acid reflux).     . pravastatin (PRAVACHOL) 20 MG tablet Take 20 mg by mouth daily.    . Probiotic Product (PROBIOTIC FORMULA PO) Take 1 each by mouth daily.    . raloxifene (EVISTA) 60 MG tablet Take 1 tablet (60 mg total) by mouth daily. 90 tablet 3  . tiZANidine (ZANAFLEX) 4 MG tablet Take 1 tablet (4 mg total) by mouth at bedtime. 30 tablet 2  . traMADol (ULTRAM) 50 MG tablet Take 1 tablet (50 mg total) by mouth every 12 (twelve) hours as needed. 60 tablet  2  . vitamin B-12 (CYANOCOBALAMIN) 1000 MCG tablet Take 1,000 mcg by mouth daily.     No current facility-administered medications for this visit.    PHYSICAL EXAMINATION: ECOG PERFORMANCE STATUS: 1 - Symptomatic but completely ambulatory  Filed Vitals:   03/05/15 1113  BP: 130/76  Pulse: 69  Temp: 97.9 F (36.6 C)  Resp: 18   Filed Weights   03/05/15 1113  Weight: 122 lb 14.4 oz (55.747 kg)    GENERAL:alert, no distress and  comfortable SKIN: skin color, texture, turgor are normal, no rashes or significant lesions EYES: normal, Conjunctiva are pink and non-injected, sclera clear OROPHARYNX:no exudate, no erythema and lips, buccal mucosa, and tongue normal  NECK: supple, thyroid normal size, non-tender, without nodularity LYMPH:  no palpable lymphadenopathy in the cervical, axillary or inguinal LUNGS: clear to auscultation and percussion with normal breathing effort HEART: regular rate & rhythm and no murmurs and no lower extremity edema ABDOMEN:abdomen soft, non-tender and normal bowel sounds Musculoskeletal:no cyanosis of digits and no clubbing  NEURO: alert & oriented x 3 with fluent speech, no focal motor/sensory deficits BREAST: No palpable masses or nodules in either right or left breasts. No palpable axillary supraclavicular or infraclavicular adenopathy no breast tenderness or nipple discharge. (exam performed in the presence of a chaperone)  LABORATORY DATA:  I have reviewed the data as listed   Chemistry      Component Value Date/Time   NA 142 07/25/2014 1004   NA 140 03/25/2014 1855   K 4.6 07/25/2014 1004   K 4.6 03/25/2014 1855   CL 102 03/25/2014 1855   CL 106 10/24/2012 1255   CO2 28 07/25/2014 1004   CO2 25 03/25/2014 1855   BUN 21.9 07/25/2014 1004   BUN 15 03/25/2014 1855   CREATININE 0.9 07/25/2014 1004   CREATININE 1.19* 03/25/2014 1855      Component Value Date/Time   CALCIUM 10.1 07/25/2014 1004   CALCIUM 10.2 03/25/2014 1855   ALKPHOS 50 07/25/2014 1004   ALKPHOS 47 01/04/2012 1149   AST 35* 07/25/2014 1004   AST 20 01/04/2012 1149   ALT 32 07/25/2014 1004   ALT 13 01/04/2012 1149   BILITOT 0.60 07/25/2014 1004   BILITOT 0.4 01/04/2012 1149       Lab Results  Component Value Date   WBC 3.8* 03/05/2015   HGB 14.2 03/05/2015   HCT 43.1 03/05/2015   MCV 96.6 03/05/2015   PLT 118* 03/05/2015   NEUTROABS 1.8 03/05/2015     RADIOGRAPHIC STUDIES: I have personally  reviewed the radiology reports and agreed with their findings. 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   Breast cancer surveillance: 1. Mammogram 02/23/2015 is normal breast density C 2. Breast exam 03/05/2015 is normal 3. Bone density 02/23/2015 showed osteopenia T score -1.9  Patient had previously lost significant weight because of depression related to being separated from her grandchildren. Apparently her daughter's husband had transgender transformation at that time developed into a major family problems which led to separation from her grandchildren.   Return to clinic in 1 year for follow-up and breast exams    Orders Placed This Encounter  Procedures  .  CBC with Differential    Standing Status: Future     Number of Occurrences:      Standing Expiration Date: 03/04/2016  . Comprehensive metabolic panel (Cmet) - [facility]    Standing Status: Future     Number of Occurrences:      Standing Expiration Date: 03/04/2016   The patient has a good understanding of the overall plan. she agrees with it. She will call with any problems that may develop before her next visit here.   Rulon Eisenmenger, MD

## 2015-03-18 ENCOUNTER — Other Ambulatory Visit: Payer: Self-pay | Admitting: Gastroenterology

## 2015-03-18 DIAGNOSIS — R1310 Dysphagia, unspecified: Secondary | ICD-10-CM

## 2015-03-18 DIAGNOSIS — R1013 Epigastric pain: Secondary | ICD-10-CM

## 2015-03-20 ENCOUNTER — Ambulatory Visit
Admission: RE | Admit: 2015-03-20 | Discharge: 2015-03-20 | Disposition: A | Discharge status: Home / Self Care | Payer: Medicare Other | Source: Ambulatory Visit | Attending: Gastroenterology | Admitting: Gastroenterology

## 2015-03-20 ENCOUNTER — Other Ambulatory Visit: Payer: Medicare Other

## 2015-03-20 DIAGNOSIS — R1013 Epigastric pain: Secondary | ICD-10-CM

## 2015-03-20 DIAGNOSIS — R1310 Dysphagia, unspecified: Secondary | ICD-10-CM

## 2015-03-24 ENCOUNTER — Ambulatory Visit: Payer: Medicare Other | Admitting: Physical Medicine & Rehabilitation

## 2015-04-13 ENCOUNTER — Encounter: Payer: Self-pay | Admitting: Physical Medicine & Rehabilitation

## 2015-04-13 ENCOUNTER — Ambulatory Visit (HOSPITAL_BASED_OUTPATIENT_CLINIC_OR_DEPARTMENT_OTHER): Payer: Medicare Other | Admitting: Physical Medicine & Rehabilitation

## 2015-04-13 ENCOUNTER — Encounter: Payer: Medicare Other | Attending: Physical Medicine & Rehabilitation

## 2015-04-13 VITALS — BP 128/96 | HR 74 | Resp 14

## 2015-04-13 DIAGNOSIS — G5702 Lesion of sciatic nerve, left lower limb: Secondary | ICD-10-CM | POA: Diagnosis present

## 2015-04-13 NOTE — Patient Instructions (Signed)
Take tramadol as directed for trip

## 2015-04-13 NOTE — Progress Notes (Signed)
Left piriformis injection under ultrasound guidance  Indication left piriformis syndrome which has not responded to physical therapy or medication management  Informed consent was obtained after the patient reviewed and anatomy and potential risks as well as potential benefits. She  has given written consent Patient placed prone on exam table area pre-scan and with curvilinear 4 Hz transducer starting at the PSIS then identifying the PI IS and then orienting transducer along the long axis of the piriformis muscle. Area was marked and prepped with Betadine and draped in a sterile manner. 3 cc of lidocaine were infiltrate into the skin and subcutaneous tissue using a 25-gauge 1.5 inch needle. Then a 50 mm echo block needle was inserted under ultrasound guidance targeting the piriformis muscle. Needle tip entered piriformis muscle then a solution containing one ML of Celestone 6 mg +4 ML of 1% lidocaine was injected. Patient tolerated procedure well

## 2015-05-07 ENCOUNTER — Other Ambulatory Visit: Payer: Self-pay | Admitting: Physical Medicine & Rehabilitation

## 2015-05-14 ENCOUNTER — Ambulatory Visit: Payer: Medicare Other

## 2015-05-14 ENCOUNTER — Ambulatory Visit: Payer: Medicare Other | Admitting: Physical Medicine & Rehabilitation

## 2015-06-08 ENCOUNTER — Encounter: Payer: Self-pay | Admitting: Physical Medicine & Rehabilitation

## 2015-06-08 ENCOUNTER — Ambulatory Visit (HOSPITAL_BASED_OUTPATIENT_CLINIC_OR_DEPARTMENT_OTHER): Payer: Medicare Other | Admitting: Physical Medicine & Rehabilitation

## 2015-06-08 ENCOUNTER — Other Ambulatory Visit: Payer: Self-pay | Admitting: *Deleted

## 2015-06-08 ENCOUNTER — Encounter: Payer: Medicare Other | Attending: Physical Medicine & Rehabilitation

## 2015-06-08 VITALS — BP 111/77 | HR 76 | Resp 14

## 2015-06-08 DIAGNOSIS — M545 Low back pain: Secondary | ICD-10-CM | POA: Diagnosis not present

## 2015-06-08 DIAGNOSIS — G5702 Lesion of sciatic nerve, left lower limb: Secondary | ICD-10-CM

## 2015-06-08 DIAGNOSIS — G8929 Other chronic pain: Secondary | ICD-10-CM | POA: Diagnosis not present

## 2015-06-08 MED ORDER — GABAPENTIN 100 MG PO CAPS: 100.0000 mg | ORAL_CAPSULE | Freq: Three times a day (TID) | ORAL | Status: DC

## 2015-06-08 MED ORDER — TRAMADOL HCL 50 MG PO TABS: 50.0000 mg | ORAL_TABLET | Freq: Three times a day (TID) | ORAL | Status: DC

## 2015-06-08 MED ORDER — GABAPENTIN 100 MG PO CAPS: 100.0000 mg | ORAL_CAPSULE | Freq: Every day | ORAL | Status: DC

## 2015-06-08 NOTE — Progress Notes (Signed)
Subjective:    Patient ID: Sabrina Sullivan, female    DOB: 08/31/1941, 74 y.o.   MRN: [id number]  HPI Left buttocks pain flared up after long drive to [place], pain radiates to left buttocks.  Came back 3 weeks ago.   Troch bursa area is ok No falls or other trauma that area. No history of motor vehicle accident during her trip. Pain is in a similar location as beforeHowever worsenedTried pressure relief Stretching exercises Pain Inventory Average Pain 8 Pain Right Now 9 My pain is constant, sharp, burning, tingling and aching  In the last 24 hours, has pain interfered with the following? General activity 9 Relation with others 9 Enjoyment of life 9 What TIME of day is your pain at its worst? ALL Sleep (in general) Fair  Pain is worse with: walking, sitting, standing and some activites Pain improves with: rest and heat/ice Relief from Meds: 0  Mobility walk without assistance how many minutes can you walk? 2-3 ability to climb steps?  yes do you drive?  yes  Function retired  Neuro/Psych tingling  Prior Studies Any changes since last visit?  no  Physicians involved in your care Any changes since last visit?  no   Family History  Problem Relation Age of Onset  . Cancer Mother     uterine  . Cancer Maternal Uncle     lung  . Cancer Daughter     breast   History   Social History  . Marital Status: Divorced    Spouse Name: N/A  . Number of Children: N/A  . Years of Education: N/A   Occupational History  . Retired/Disability    Social History Main Topics  . Smoking status: Former Smoker -- 0.50 packs/day    Quit date: 01/22/2008  . Smokeless tobacco: Never Used  . Alcohol Use: Yes  . Drug Use: No  . 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 None   Social History Narrative   Past Surgical History  Procedure Laterality Date  . Cervical fusion    . Rotator cuff repair    . Replacement total knee      . Joint replacement    . Tonsillectomy    . Breast lumpectomy  02/02/2012    right  . Abdominal hysterectomy  1970    has ovaries  . Esophageal manometry N/A 04/07/2014    Procedure: ESOPHAGEAL MANOMETRY (EM);  Surgeon: Garlan Fair, MD;  Location: [facility];  Service: Endoscopy;  Laterality: N/A;  . Esophagogastroduodenoscopy N/A 02/23/2015    Procedure: ESOPHAGOGASTRODUODENOSCOPY (EGD);  Surgeon: Garlan Fair, MD;  Location: [facility];  Service: Endoscopy;  Laterality: N/A;  . Botox injection N/A 02/23/2015    Procedure: BOTOX INJECTION;  Surgeon: Garlan Fair, MD;  Location: [facility];  Service: Endoscopy;  Laterality: N/A;   Past Medical History  Diagnosis Date  . Rosacea   . High cholesterol   . Low back pain   . Left arm pain   . Neck pain   . Thoracic back pain   . Complication of anesthesia 2006    POST OP HALLUCINATIONS AND CONFUSIION /"MAYBE SOMETHING FOR PAIN "  . GERD (gastroesophageal reflux disease)   . 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 Ear disorder     TINNITIS  . Urine frequency     AND PT GETS UP AT NIGHT  . Dental crowns present   .  Cancer     breast- right  . History of radiation therapy 03/22/12-04/19/12    right breast   BP 111/77 mmHg  Pulse 76  Resp 14  SpO2 96%  Opioid Risk Score:   Fall Risk Score: Low Fall Risk (0-5 points)`1  Depression screen PHQ 2/9  No flowsheet data found.   Review of Systems  Constitutional: Negative.   HENT: Negative.   Eyes: Negative.   Respiratory: Negative.   Cardiovascular: Negative.   Gastrointestinal: Negative.   Endocrine: Negative.   Genitourinary: Negative.   Musculoskeletal: Positive for myalgias, back pain and arthralgias.  Skin: Negative.   Allergic/Immunologic: Negative.   Neurological:       Tingling, increased pain  Hematological: Negative.   Psychiatric/Behavioral: Negative.        Objective:   Physical Exam  Constitutional: She is oriented to person, place, and  time. She appears well-developed and well-nourished.  HENT:  Head: Normocephalic and atraumatic.  Musculoskeletal:  Decreased lumbar range of motion with flexion extension lateral bending and rotation. Tenderness palpation over the left piriformis area No tenderness over the right side No tenderness over the greater trochanteric area  Neurological: She is alert and oriented to person, place, and time. She has normal strength. No sensory deficit.  Psychiatric: She has a normal mood and affect.  Nursing note and vitals reviewed.         Assessment & Plan:  1. Piriformis syndrome with exacerbation after very long drive to [place]. She had her last injection performed 04/16/2015 under ultrasound guidance which gave good relief until the trip. May repeat next month. At this point I don't think any further imaging studies are needed if it does not improve we'll need to pursue these. Do not think she has any recurrence of trochanteric bursitis at the current time. Recommend continue stretching program Will increase tramadol to 50 g 3 times a day Continue gabapentin 100 g daily at bedtime, the patient is sensitive to this medication even at 200 mg at night she has hangover effect in the morning.

## 2015-06-08 NOTE — Patient Instructions (Signed)
Gabapentin restarted, take it 3 times per day  Next month will repeat ultrasound guided piriformis injection

## 2015-07-14 ENCOUNTER — Encounter: Payer: Medicare Other | Attending: Physical Medicine & Rehabilitation

## 2015-07-14 ENCOUNTER — Encounter: Payer: Self-pay | Admitting: Physical Medicine & Rehabilitation

## 2015-07-14 ENCOUNTER — Ambulatory Visit (HOSPITAL_BASED_OUTPATIENT_CLINIC_OR_DEPARTMENT_OTHER): Payer: Medicare Other | Admitting: Physical Medicine & Rehabilitation

## 2015-07-14 VITALS — BP 111/74 | HR 69

## 2015-07-14 DIAGNOSIS — G5702 Lesion of sciatic nerve, left lower limb: Secondary | ICD-10-CM | POA: Insufficient documentation

## 2015-07-14 NOTE — Progress Notes (Signed)
Left piriformis injection under ultrasound guidance  Indication left piriformis syndrome which has not responded to physical therapy or medication management  Informed consent was obtained after the patient reviewed and anatomy and potential risks as well as potential benefits. She  has given written consent Patient placed prone on exam table area pre-scan and with curvilinear 4 Hz transducer starting at the PSIS then identifying the PI IS and then orienting transducer along the long axis of the piriformis muscle. Area was marked and prepped with Betadine and draped in a sterile manner. 3 cc of lidocaine were infiltrate into the skin and subcutaneous tissue using a 25-gauge 1.5 inch needle. Then a 80 mm echo block needle was inserted under ultrasound guidance targeting the piriformis muscle. Needle tip entered piriformis muscle then a solution containing one ML of Celestone 6 mg +4 ML of 1% lidocaine was injected. Patient tolerated procedure well

## 2015-07-14 NOTE — Patient Instructions (Signed)
Piriformis injection under ultrasound guidance today. Combination of lidocaine plus a cortisone type medicine should reach maximum effect in 1-2 days

## 2015-08-06 ENCOUNTER — Other Ambulatory Visit: Payer: Self-pay | Admitting: Physical Medicine & Rehabilitation

## 2015-08-25 ENCOUNTER — Other Ambulatory Visit: Payer: Self-pay | Admitting: Physical Medicine & Rehabilitation

## 2015-08-25 ENCOUNTER — Ambulatory Visit: Payer: Medicare Other | Admitting: Physical Medicine & Rehabilitation

## 2015-09-11 ENCOUNTER — Telehealth: Payer: Self-pay | Admitting: Hematology and Oncology

## 2015-09-11 NOTE — Telephone Encounter (Signed)
s.w. pt and adivsedon 4.14 appt cx and moved to 4.21 due to me on pal....pt ok and aware

## 2015-09-28 ENCOUNTER — Other Ambulatory Visit: Payer: Self-pay | Admitting: Physical Medicine & Rehabilitation

## 2015-11-23 ENCOUNTER — Other Ambulatory Visit: Payer: Self-pay | Admitting: Physical Medicine & Rehabilitation

## 2015-12-24 DIAGNOSIS — I774 Celiac artery compression syndrome: Secondary | ICD-10-CM | POA: Diagnosis not present

## 2015-12-24 DIAGNOSIS — E7800 Pure hypercholesterolemia, unspecified: Secondary | ICD-10-CM | POA: Diagnosis not present

## 2015-12-24 DIAGNOSIS — I491 Atrial premature depolarization: Secondary | ICD-10-CM | POA: Diagnosis not present

## 2015-12-25 DIAGNOSIS — R631 Polydipsia: Secondary | ICD-10-CM | POA: Diagnosis not present

## 2015-12-25 DIAGNOSIS — R197 Diarrhea, unspecified: Secondary | ICD-10-CM | POA: Diagnosis not present

## 2016-01-06 DIAGNOSIS — R1310 Dysphagia, unspecified: Secondary | ICD-10-CM | POA: Diagnosis not present

## 2016-01-11 ENCOUNTER — Other Ambulatory Visit: Payer: Self-pay | Admitting: Physical Medicine & Rehabilitation

## 2016-01-12 DIAGNOSIS — N3010 Interstitial cystitis (chronic) without hematuria: Secondary | ICD-10-CM | POA: Diagnosis not present

## 2016-01-12 DIAGNOSIS — I700 Atherosclerosis of aorta: Secondary | ICD-10-CM | POA: Diagnosis not present

## 2016-01-12 DIAGNOSIS — Z853 Personal history of malignant neoplasm of breast: Secondary | ICD-10-CM | POA: Diagnosis not present

## 2016-01-12 DIAGNOSIS — Z79899 Other long term (current) drug therapy: Secondary | ICD-10-CM | POA: Diagnosis not present

## 2016-01-12 DIAGNOSIS — E785 Hyperlipidemia, unspecified: Secondary | ICD-10-CM | POA: Diagnosis not present

## 2016-01-12 NOTE — Telephone Encounter (Signed)
May call in 1 month supply of tramadol and then make appointment

## 2016-01-15 DIAGNOSIS — K449 Diaphragmatic hernia without obstruction or gangrene: Secondary | ICD-10-CM | POA: Diagnosis not present

## 2016-01-15 DIAGNOSIS — C50919 Malignant neoplasm of unspecified site of unspecified female breast: Secondary | ICD-10-CM | POA: Diagnosis not present

## 2016-01-15 DIAGNOSIS — N184 Chronic kidney disease, stage 4 (severe): Secondary | ICD-10-CM | POA: Diagnosis not present

## 2016-01-15 DIAGNOSIS — N179 Acute kidney failure, unspecified: Secondary | ICD-10-CM | POA: Diagnosis not present

## 2016-01-15 DIAGNOSIS — N12 Tubulo-interstitial nephritis, not specified as acute or chronic: Secondary | ICD-10-CM | POA: Diagnosis not present

## 2016-01-15 DIAGNOSIS — N390 Urinary tract infection, site not specified: Secondary | ICD-10-CM | POA: Diagnosis not present

## 2016-01-18 DIAGNOSIS — N184 Chronic kidney disease, stage 4 (severe): Secondary | ICD-10-CM | POA: Diagnosis not present

## 2016-01-19 ENCOUNTER — Other Ambulatory Visit: Payer: Self-pay | Admitting: Nephrology

## 2016-01-19 DIAGNOSIS — N12 Tubulo-interstitial nephritis, not specified as acute or chronic: Secondary | ICD-10-CM

## 2016-01-19 DIAGNOSIS — N184 Chronic kidney disease, stage 4 (severe): Principal | ICD-10-CM

## 2016-01-19 DIAGNOSIS — I129 Hypertensive chronic kidney disease with stage 1 through stage 4 chronic kidney disease, or unspecified chronic kidney disease: Secondary | ICD-10-CM

## 2016-01-21 DIAGNOSIS — N390 Urinary tract infection, site not specified: Secondary | ICD-10-CM | POA: Diagnosis not present

## 2016-01-28 ENCOUNTER — Ambulatory Visit
Admission: RE | Admit: 2016-01-28 | Discharge: 2016-01-28 | Disposition: A | Discharge status: Home / Self Care | Payer: Medicare Other | Source: Ambulatory Visit | Attending: Nephrology | Admitting: Nephrology

## 2016-01-28 DIAGNOSIS — I129 Hypertensive chronic kidney disease with stage 1 through stage 4 chronic kidney disease, or unspecified chronic kidney disease: Secondary | ICD-10-CM | POA: Diagnosis not present

## 2016-01-28 DIAGNOSIS — N12 Tubulo-interstitial nephritis, not specified as acute or chronic: Secondary | ICD-10-CM

## 2016-01-28 DIAGNOSIS — N184 Chronic kidney disease, stage 4 (severe): Principal | ICD-10-CM

## 2016-02-04 DIAGNOSIS — H6123 Impacted cerumen, bilateral: Secondary | ICD-10-CM | POA: Diagnosis not present

## 2016-02-09 ENCOUNTER — Other Ambulatory Visit: Payer: Self-pay | Admitting: General Surgery

## 2016-02-09 DIAGNOSIS — E559 Vitamin D deficiency, unspecified: Secondary | ICD-10-CM | POA: Diagnosis not present

## 2016-02-09 DIAGNOSIS — N184 Chronic kidney disease, stage 4 (severe): Secondary | ICD-10-CM | POA: Diagnosis not present

## 2016-02-09 DIAGNOSIS — N183 Chronic kidney disease, stage 3 (moderate): Secondary | ICD-10-CM | POA: Diagnosis not present

## 2016-02-09 DIAGNOSIS — E785 Hyperlipidemia, unspecified: Secondary | ICD-10-CM | POA: Diagnosis not present

## 2016-02-09 DIAGNOSIS — N3010 Interstitial cystitis (chronic) without hematuria: Secondary | ICD-10-CM | POA: Diagnosis not present

## 2016-02-09 DIAGNOSIS — K219 Gastro-esophageal reflux disease without esophagitis: Secondary | ICD-10-CM | POA: Diagnosis not present

## 2016-02-09 DIAGNOSIS — Z853 Personal history of malignant neoplasm of breast: Secondary | ICD-10-CM

## 2016-02-23 DIAGNOSIS — N12 Tubulo-interstitial nephritis, not specified as acute or chronic: Secondary | ICD-10-CM | POA: Diagnosis not present

## 2016-02-23 DIAGNOSIS — N179 Acute kidney failure, unspecified: Secondary | ICD-10-CM | POA: Diagnosis not present

## 2016-02-23 DIAGNOSIS — N184 Chronic kidney disease, stage 4 (severe): Secondary | ICD-10-CM | POA: Diagnosis not present

## 2016-02-23 DIAGNOSIS — M1990 Unspecified osteoarthritis, unspecified site: Secondary | ICD-10-CM | POA: Diagnosis not present

## 2016-02-23 DIAGNOSIS — C50919 Malignant neoplasm of unspecified site of unspecified female breast: Secondary | ICD-10-CM | POA: Diagnosis not present

## 2016-02-26 ENCOUNTER — Encounter: Payer: Medicare Other | Attending: Physical Medicine & Rehabilitation

## 2016-02-26 ENCOUNTER — Ambulatory Visit (HOSPITAL_BASED_OUTPATIENT_CLINIC_OR_DEPARTMENT_OTHER): Payer: Medicare Other | Admitting: Physical Medicine & Rehabilitation

## 2016-02-26 ENCOUNTER — Encounter: Payer: Self-pay | Admitting: Physical Medicine & Rehabilitation

## 2016-02-26 VITALS — BP 120/73 | HR 74

## 2016-02-26 DIAGNOSIS — H9319 Tinnitus, unspecified ear: Secondary | ICD-10-CM | POA: Insufficient documentation

## 2016-02-26 DIAGNOSIS — M545 Low back pain: Secondary | ICD-10-CM | POA: Insufficient documentation

## 2016-02-26 DIAGNOSIS — M7061 Trochanteric bursitis, right hip: Secondary | ICD-10-CM

## 2016-02-26 DIAGNOSIS — M791 Myalgia: Secondary | ICD-10-CM | POA: Diagnosis not present

## 2016-02-26 DIAGNOSIS — Z923 Personal history of irradiation: Secondary | ICD-10-CM | POA: Diagnosis not present

## 2016-02-26 DIAGNOSIS — M542 Cervicalgia: Secondary | ICD-10-CM | POA: Insufficient documentation

## 2016-02-26 DIAGNOSIS — M7062 Trochanteric bursitis, left hip: Secondary | ICD-10-CM | POA: Insufficient documentation

## 2016-02-26 DIAGNOSIS — Z87891 Personal history of nicotine dependence: Secondary | ICD-10-CM | POA: Insufficient documentation

## 2016-02-26 DIAGNOSIS — G5702 Lesion of sciatic nerve, left lower limb: Secondary | ICD-10-CM

## 2016-02-26 DIAGNOSIS — K219 Gastro-esophageal reflux disease without esophagitis: Secondary | ICD-10-CM | POA: Insufficient documentation

## 2016-02-26 DIAGNOSIS — M47816 Spondylosis without myelopathy or radiculopathy, lumbar region: Secondary | ICD-10-CM

## 2016-02-26 DIAGNOSIS — E7800 Pure hypercholesterolemia, unspecified: Secondary | ICD-10-CM | POA: Diagnosis not present

## 2016-02-26 DIAGNOSIS — Z853 Personal history of malignant neoplasm of breast: Secondary | ICD-10-CM | POA: Diagnosis not present

## 2016-02-26 DIAGNOSIS — M7918 Myalgia, other site: Secondary | ICD-10-CM

## 2016-02-26 DIAGNOSIS — L719 Rosacea, unspecified: Secondary | ICD-10-CM | POA: Diagnosis not present

## 2016-02-26 DIAGNOSIS — Z981 Arthrodesis status: Secondary | ICD-10-CM | POA: Diagnosis not present

## 2016-02-26 NOTE — Progress Notes (Signed)
Subjective:    Patient ID: Sabrina Sullivan, female    DOB: 11-May-1941, 75 y.o.   MRN: [id number]  HPI Had ecoli UTI and pyelonephritis, had renal failure.   Renal function starting to improve  Feels weak and legs hurt  Hx of left piriformis syndrome  Pain Inventory Average Pain 9 Pain Right Now 9 My pain is constant, sharp, burning and aching  In the last 24 hours, has pain interfered with the following? General activity 9 Relation with others 9 Enjoyment of life 10 What TIME of day is your pain at its worst? all Sleep (in general) Poor  Pain is worse with: walking, bending, sitting, standing and some activites Pain improves with: medication Relief from Meds: 5  Mobility ability to climb steps?  yes do you drive?  yes  Function retired  Neuro/Psych trouble walking  Prior Studies Any changes since last visit?  no  Physicians involved in your care Any changes since last visit?  no   Family History  Problem Relation Age of Onset  . Cancer Mother     uterine  . Cancer Maternal Uncle     lung  . Cancer Daughter     breast   Social History   Social History  . Marital Status: Divorced    Spouse Name: N/A  . Number of Children: N/A  . Years of Education: N/A   Occupational History  . Retired/Disability    Social History Main Topics  . Smoking status: Former Smoker -- 0.50 packs/day    Quit date: 01/22/2008  . Smokeless tobacco: Never Used  . Alcohol Use: Yes  . Drug Use: No  . 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 None   Social History Narrative   Past Surgical History  Procedure Laterality Date  . Cervical fusion    . Rotator cuff repair    . Replacement total knee    . Joint replacement    . Tonsillectomy    . Breast lumpectomy  02/02/2012    right  . Abdominal hysterectomy  1970    has ovaries  . Esophageal manometry N/A 04/07/2014    Procedure: ESOPHAGEAL MANOMETRY (EM);  Surgeon:  Garlan Fair, MD;  Location: [facility];  Service: Endoscopy;  Laterality: N/A;  . Esophagogastroduodenoscopy N/A 02/23/2015    Procedure: ESOPHAGOGASTRODUODENOSCOPY (EGD);  Surgeon: Garlan Fair, MD;  Location: [facility];  Service: Endoscopy;  Laterality: N/A;  . Botox injection N/A 02/23/2015    Procedure: BOTOX INJECTION;  Surgeon: Garlan Fair, MD;  Location: [facility];  Service: Endoscopy;  Laterality: N/A;   Past Medical History  Diagnosis Date  . Rosacea   . High cholesterol   . Low back pain   . Left arm pain   . Neck pain   . Thoracic back pain   . Complication of anesthesia 2006    POST OP HALLUCINATIONS AND CONFUSIION /"MAYBE SOMETHING FOR PAIN "  . GERD (gastroesophageal reflux disease)   . 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 Ear disorder     TINNITIS  . Urine frequency     AND PT GETS UP AT NIGHT  . Dental crowns present   . Cancer [facility])     breast- right  . History of radiation therapy 03/22/12-04/19/12    right breast   BP 120/73 mmHg  Pulse 74  SpO2 100%  Opioid Risk Score:  Fall Risk Score:  `1  Depression screen PHQ 2/9  Depression screen PHQ 2/9 07/14/2015  Decreased Interest 1  Down, Depressed, Hopeless 1  PHQ - 2 Score 2  Altered sleeping 0  Tired, decreased energy 0  Change in appetite 0  Feeling bad or failure about yourself  0  Trouble concentrating 0  Moving slowly or fidgety/restless 0  Suicidal thoughts 0  PHQ-9 Score 2  Difficult doing work/chores Not difficult at all    Review of Systems  Constitutional: Positive for unexpected weight change.  All other systems reviewed and are negative.      Objective:   Physical Exam  Constitutional: She is oriented to person, place, and time. She appears well-developed and well-nourished.  HENT:  Head: Normocephalic and atraumatic.  Eyes: Conjunctivae and EOM are normal. Pupils are equal, round, and reactive to light.  Neck: Normal range of motion.  Musculoskeletal:        Right hip: She exhibits bony tenderness.       Left hip: She exhibits bony tenderness.       Lumbar back: She exhibits normal range of motion, no tenderness and no deformity.  Neurological: She is alert and oriented to person, place, and time. She has normal strength. No sensory deficit. She exhibits normal muscle tone. Coordination normal.  Psychiatric: She has a normal mood and affect.  Nursing note and vitals reviewed.   No tenderness to palpation over the left piriformis She is tenderness palpation over bilateral greater trochanters of the hip.      Assessment & Plan:  1. Myofascial pain syndrome, exacerbated by overall weakness following acute renal failure. I do think she would benefit from outpatient physical therapy. She will check on her insurance benefits for this. This would be done locally at [facility]. 2. History of piriformis syndrome does not appear to be active at the current time 3. Bilateral trochanteric bursitis would recommend repeat injection under ultrasound guidance.  4. General debility after acute medical illness, have advised nepro supplements

## 2016-02-26 NOTE — Patient Instructions (Signed)
May try Nepro which is a version of ensure for kidney patient

## 2016-02-29 DIAGNOSIS — I7090 Unspecified atherosclerosis: Secondary | ICD-10-CM | POA: Diagnosis not present

## 2016-02-29 DIAGNOSIS — E785 Hyperlipidemia, unspecified: Secondary | ICD-10-CM | POA: Diagnosis not present

## 2016-02-29 DIAGNOSIS — I700 Atherosclerosis of aorta: Secondary | ICD-10-CM | POA: Diagnosis not present

## 2016-02-29 DIAGNOSIS — N289 Disorder of kidney and ureter, unspecified: Secondary | ICD-10-CM | POA: Diagnosis not present

## 2016-02-29 DIAGNOSIS — I774 Celiac artery compression syndrome: Secondary | ICD-10-CM | POA: Diagnosis not present

## 2016-03-04 ENCOUNTER — Other Ambulatory Visit: Payer: Medicare Other

## 2016-03-04 ENCOUNTER — Ambulatory Visit: Payer: Medicare Other | Admitting: Hematology and Oncology

## 2016-03-07 DIAGNOSIS — R1310 Dysphagia, unspecified: Secondary | ICD-10-CM | POA: Diagnosis not present

## 2016-03-10 ENCOUNTER — Other Ambulatory Visit: Payer: Self-pay

## 2016-03-10 DIAGNOSIS — C50411 Malignant neoplasm of upper-outer quadrant of right female breast: Secondary | ICD-10-CM

## 2016-03-11 ENCOUNTER — Other Ambulatory Visit: Payer: Medicare Other

## 2016-03-11 ENCOUNTER — Ambulatory Visit: Payer: Self-pay | Admitting: Hematology and Oncology

## 2016-03-11 NOTE — Assessment & Plan Note (Signed)
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   Breast cancer surveillance: 1. Mammogram 02/23/2015 is normal breast density C. Mammogram to be done on 03/15/2016 2. Breast exam 03/11/2016 is normal 3. Bone density 02/23/2015 showed osteopenia T score -1.9  Patient had previously lost significant weight because of depression related to being separated from her grandchildren. Apparently her daughter's husband had transgender transformation at that time developed into a major family problems which led to separation from her grandchildren.   Return to clinic in 1 year for follow-up and breast exams

## 2016-03-15 ENCOUNTER — Ambulatory Visit
Admission: RE | Admit: 2016-03-15 | Discharge: 2016-03-15 | Disposition: A | Discharge status: Home / Self Care | Payer: Medicare Other | Source: Ambulatory Visit | Attending: General Surgery | Admitting: General Surgery

## 2016-03-15 DIAGNOSIS — Z853 Personal history of malignant neoplasm of breast: Secondary | ICD-10-CM

## 2016-03-15 DIAGNOSIS — R922 Inconclusive mammogram: Secondary | ICD-10-CM | POA: Diagnosis not present

## 2016-03-17 ENCOUNTER — Encounter: Payer: Self-pay | Admitting: Physical Medicine & Rehabilitation

## 2016-03-17 ENCOUNTER — Ambulatory Visit (HOSPITAL_BASED_OUTPATIENT_CLINIC_OR_DEPARTMENT_OTHER): Payer: Medicare Other | Admitting: Physical Medicine & Rehabilitation

## 2016-03-17 VITALS — BP 124/80 | HR 84 | Resp 12

## 2016-03-17 DIAGNOSIS — M7062 Trochanteric bursitis, left hip: Secondary | ICD-10-CM | POA: Diagnosis not present

## 2016-03-17 DIAGNOSIS — Z923 Personal history of irradiation: Secondary | ICD-10-CM | POA: Diagnosis not present

## 2016-03-17 DIAGNOSIS — H9319 Tinnitus, unspecified ear: Secondary | ICD-10-CM | POA: Diagnosis not present

## 2016-03-17 DIAGNOSIS — M545 Low back pain: Secondary | ICD-10-CM | POA: Diagnosis not present

## 2016-03-17 DIAGNOSIS — G5702 Lesion of sciatic nerve, left lower limb: Secondary | ICD-10-CM | POA: Diagnosis not present

## 2016-03-17 DIAGNOSIS — Z853 Personal history of malignant neoplasm of breast: Secondary | ICD-10-CM | POA: Diagnosis not present

## 2016-03-17 DIAGNOSIS — J209 Acute bronchitis, unspecified: Secondary | ICD-10-CM | POA: Diagnosis not present

## 2016-03-17 DIAGNOSIS — L719 Rosacea, unspecified: Secondary | ICD-10-CM | POA: Diagnosis not present

## 2016-03-17 DIAGNOSIS — M7061 Trochanteric bursitis, right hip: Secondary | ICD-10-CM

## 2016-03-17 DIAGNOSIS — M791 Myalgia: Secondary | ICD-10-CM | POA: Diagnosis not present

## 2016-03-17 DIAGNOSIS — Z87891 Personal history of nicotine dependence: Secondary | ICD-10-CM | POA: Diagnosis not present

## 2016-03-17 DIAGNOSIS — E7800 Pure hypercholesterolemia, unspecified: Secondary | ICD-10-CM | POA: Diagnosis not present

## 2016-03-17 DIAGNOSIS — J0100 Acute maxillary sinusitis, unspecified: Secondary | ICD-10-CM | POA: Diagnosis not present

## 2016-03-17 DIAGNOSIS — K219 Gastro-esophageal reflux disease without esophagitis: Secondary | ICD-10-CM | POA: Diagnosis not present

## 2016-03-17 DIAGNOSIS — M47816 Spondylosis without myelopathy or radiculopathy, lumbar region: Secondary | ICD-10-CM | POA: Diagnosis not present

## 2016-03-17 DIAGNOSIS — M542 Cervicalgia: Secondary | ICD-10-CM | POA: Diagnosis not present

## 2016-03-17 DIAGNOSIS — Z981 Arthrodesis status: Secondary | ICD-10-CM | POA: Diagnosis not present

## 2016-03-17 NOTE — Patient Instructions (Signed)
Today we did bilateral trochanteric bursa injections with Celestone and lidocaine. We can repeat these every 3 months as needed if these are helpful for you.

## 2016-03-17 NOTE — Progress Notes (Signed)
Bilateral Trochanteric bursa injection With ultrasound guidance  Indication Trochanteric bursitis. Exam has tenderness over the greater trochanter of the hip. Pain has not responded to conservative care such as exercise therapy and oral medications. Pain interferes with sleep or with mobility Informed consent was obtained after describing risks and benefits of the procedure with the patient these include bleeding bruising and infection. Patient has signed written consent form. Patient placed in a lateral decubitus position with the Left hip superior. Point of maximal pain was Scanned, greater trochanter identified, marked and prepped with Betadine . One percent lidocaine was used to infiltrate skin and subcutaneous tissue under direct ultrasound visualization . Then 80 mm Echo block Needle Inserted under direct ultrasound visualization to bone contact with the greater trochanter,slightly withdrawn then 6mg  of betamethasone with 4 cc 1% lidocaine were injected.Then the patient wasWas placed in the left lateral decubitus position, right hip superior. The same procedure was performed usingSame equipment and injectate. Patient tolerated procedure well. Post procedure instructions given. Patient noted immediate pain relief postprocedure

## 2016-03-18 ENCOUNTER — Telehealth: Payer: Self-pay | Admitting: Hematology and Oncology

## 2016-03-18 NOTE — Telephone Encounter (Signed)
s.w. pt and r/s missed appt.....pt ok and aware °

## 2016-03-21 DIAGNOSIS — M545 Low back pain: Secondary | ICD-10-CM | POA: Diagnosis not present

## 2016-03-21 DIAGNOSIS — N390 Urinary tract infection, site not specified: Secondary | ICD-10-CM | POA: Diagnosis not present

## 2016-03-21 DIAGNOSIS — N3010 Interstitial cystitis (chronic) without hematuria: Secondary | ICD-10-CM | POA: Diagnosis not present

## 2016-03-21 DIAGNOSIS — E785 Hyperlipidemia, unspecified: Secondary | ICD-10-CM | POA: Diagnosis not present

## 2016-03-21 DIAGNOSIS — N12 Tubulo-interstitial nephritis, not specified as acute or chronic: Secondary | ICD-10-CM | POA: Diagnosis not present

## 2016-03-21 DIAGNOSIS — E559 Vitamin D deficiency, unspecified: Secondary | ICD-10-CM | POA: Diagnosis not present

## 2016-03-24 ENCOUNTER — Encounter: Payer: Medicare Other | Attending: Nephrology | Admitting: Skilled Nursing Facility1

## 2016-03-24 ENCOUNTER — Encounter: Payer: Self-pay | Admitting: Skilled Nursing Facility1

## 2016-03-24 VITALS — Ht 64.0 in | Wt 112.0 lb

## 2016-03-24 DIAGNOSIS — N183 Chronic kidney disease, stage 3 (moderate): Secondary | ICD-10-CM | POA: Diagnosis not present

## 2016-03-24 DIAGNOSIS — N184 Chronic kidney disease, stage 4 (severe): Secondary | ICD-10-CM

## 2016-03-24 NOTE — Patient Instructions (Signed)
-  Try to work in some light weights to your physical activity routine -1500 mg Sodium a day  -1000 mg of Phosphorus -100-110 grams of protein -You can do your half and half creamer -If it ends in -phate it has phosphorus in it

## 2016-03-24 NOTE — Progress Notes (Signed)
  Medical Nutrition Therapy:  Appt start time: 1500 end time:  1600.   Assessment:  Primary concerns today: referred for CKD stage 3. Pt states she lives in [place]. Pt states she needs guidance on protecting her kidneys from an infection. Pt staets she has been losing weight since the kidney infection and is down from 126 pounds. Pt states she is scared to eat so has not been eating. Pt is very anxious about eating and further damaging her kidneys.  Upon visual inspection pt has wasting: temple region, clavicle region, and in the dorsal hand.   Preferred Learning Style:   No preference indicated   Learning Readiness:   Change in progress  MEDICATIONS: See List   DIETARY INTAKE:  Usual eating pattern includes 0 meals and 3 snacks per day.  Everyday foods include none stated.  Avoided foods include many associated with kidney failure.    24-hr recall:  B ( AM): butterbread, egg white, and cheese  Snk ( AM):  L ( PM): rice and chicken and coslaw Snk ( PM):  D ( PM): salad Snk ( PM):  Beverages: lemonade, water, 7-up  Usual physical activity: walking  Estimated energy needs: 1600 calories 180 g carbohydrates 120 g protein 44 g fat  Progress Towards Goal(s):  In progress.   Nutritional Diagnosis:  NI-1.4 Inadequate energy intake As related to inadequate information offered at time of kidney infection.  As evidenced by  musculature wasting and uninentional wt loss.    Intervention:  Nutrition counseling for renal insufficiency diet. Dietitian educated the pt on the kidneys, kidney infection, nutrients of concern, and musculature/fat mass wasting and the importance of gaining wt. Goals: -Try to work in some light weights to your physical activity routine -1500 mg Sodium a day  -1000 mg of Phosphorus -100-110 grams of protein -You can do your half and half creamer -If it ends in -phate it has phosphorus in it  Teaching Method Utilized:  Visual Auditory Hands  on  Handouts given during visit include: -Multiple phosphorus foods/low sodium diet   Barriers to learning/adherence to lifestyle change: fear/anxiety  Demonstrated degree of understanding via:  Teach Back   Monitoring/Evaluation:  Dietary intake, exercise, and body weight prn.

## 2016-04-05 ENCOUNTER — Other Ambulatory Visit (HOSPITAL_BASED_OUTPATIENT_CLINIC_OR_DEPARTMENT_OTHER): Payer: Medicare Other

## 2016-04-05 ENCOUNTER — Ambulatory Visit (HOSPITAL_BASED_OUTPATIENT_CLINIC_OR_DEPARTMENT_OTHER): Payer: Medicare Other | Admitting: Hematology and Oncology

## 2016-04-05 ENCOUNTER — Telehealth: Payer: Self-pay | Admitting: Hematology and Oncology

## 2016-04-05 ENCOUNTER — Encounter: Payer: Self-pay | Admitting: Hematology and Oncology

## 2016-04-05 VITALS — BP 106/67 | HR 74 | Temp 97.4°F | Resp 16 | Wt 112.4 lb

## 2016-04-05 DIAGNOSIS — C50411 Malignant neoplasm of upper-outer quadrant of right female breast: Secondary | ICD-10-CM | POA: Diagnosis not present

## 2016-04-05 DIAGNOSIS — Z170 Estrogen receptor positive status [ER+]: Secondary | ICD-10-CM | POA: Diagnosis not present

## 2016-04-05 DIAGNOSIS — Z79811 Long term (current) use of aromatase inhibitors: Secondary | ICD-10-CM

## 2016-04-05 LAB — COMPREHENSIVE METABOLIC PANEL
ALT: 16 U/L (ref 0–55)
ANION GAP: 6 meq/L (ref 3–11)
AST: 20 U/L (ref 5–34)
Albumin: 3.8 g/dL (ref 3.5–5.0)
Alkaline Phosphatase: 54 U/L (ref 40–150)
BUN: 24.4 mg/dL (ref 7.0–26.0)
CHLORIDE: 109 meq/L (ref 98–109)
CO2: 27 meq/L (ref 22–29)
Calcium: 9.8 mg/dL (ref 8.4–10.4)
Creatinine: 1.2 mg/dL — ABNORMAL HIGH (ref 0.6–1.1)
EGFR: 43 mL/min/{1.73_m2} — AB (ref >90)
GLUCOSE: 93 mg/dL (ref 70–140)
POTASSIUM: 4.2 meq/L (ref 3.5–5.1)
SODIUM: 141 meq/L (ref 136–145)
TOTAL PROTEIN: 6.8 g/dL (ref 6.4–8.3)
Total Bilirubin: 0.36 mg/dL (ref 0.20–1.20)

## 2016-04-05 LAB — CBC WITH DIFFERENTIAL/PLATELET
BASO%: 1 % (ref 0.0–2.0)
Basophils Absolute: 0.1 10*3/uL (ref 0.0–0.1)
EOS ABS: 0.1 10*3/uL (ref 0.0–0.5)
EOS%: 1.6 % (ref 0.0–7.0)
HCT: 41.6 % (ref 34.8–46.6)
HGB: 13.4 g/dL (ref 11.6–15.9)
LYMPH%: 22.9 % (ref 14.0–49.7)
MCH: 30.2 pg (ref 25.1–34.0)
MCHC: 32.3 g/dL (ref 31.5–36.0)
MCV: 93.6 fL (ref 79.5–101.0)
MONO#: 0.5 10*3/uL (ref 0.1–0.9)
MONO%: 9.6 % (ref 0.0–14.0)
NEUT%: 64.9 % (ref 38.4–76.8)
NEUTROS ABS: 3.6 10*3/uL (ref 1.5–6.5)
PLATELETS: 133 10*3/uL — AB (ref 145–400)
RBC: 4.44 10*6/uL (ref 3.70–5.45)
RDW: 14.3 % (ref 11.2–14.5)
WBC: 5.6 10*3/uL (ref 3.9–10.3)
lymph#: 1.3 10*3/uL (ref 0.9–3.3)

## 2016-04-05 NOTE — Progress Notes (Signed)
Patient Care Team: Nicholos Johns, MD as PCP - General (Internal Medicine)  DIAGNOSIS: Primary cancer of upper outer quadrant of right female breast [facility])   Staging form: Breast, AJCC 7th Edition     Clinical: Stage IA (T1a, N0, cM0) - Unsigned       Staging comments: Staged in Breast Conference 01/04/12      Pathologic: No stage assigned - Unsigned   SUMMARY OF ONCOLOGIC HISTORY:   Primary cancer of upper outer quadrant of right female breast ([place])   01/04/2012 Initial Diagnosis Cancer of upper-outer quadrant of female breast, Right [id number]   02/02/2012 Surgery Right breast lumpectomy with SLN biopsy, IDC low-grade 0.7 cm with intermediate grade DCIS, 2 SLN negative ER 65%, PR negative, HER-2 negative, Ki-67 12%   03/26/2012 - 04/26/2012 Radiation Therapy Radiation therapy to lumpectomy site by Dr. Gery Pray    Anti-estrogen oral therapy Patient declined antiestrogen therapy    CHIEF COMPLIANT: recent kidney problems as well as continued weight loss  INTERVAL HISTORY: Sabrina Sullivan is a 75-year-old with above-mentioned history of right breast cancer who underwent lumpectomy radiation and is currently on Evista. She appears to be having multiple problems with continued weight loss. She also was hospitalized for diarrhea and also had an emergency room visit for bronchitis. Other than that she denies any lumps or nodules in the breasts.  REVIEW OF SYSTEMS:   Constitutional: Denies fevers, chills, complains of weight loss Eyes: Denies blurriness of vision Ears, nose, mouth, throat, and face: Denies mucositis or sore throat Respiratory: Denies cough, dyspnea or wheezes Cardiovascular: Denies palpitation, chest discomfort Gastrointestinal:  Denies nausea, heartburn or change in bowel habits Skin: Denies abnormal skin rashes Lymphatics: Denies new lymphadenopathy or easy bruising Neurological:Denies numbness, tingling or new weaknesses Behavioral/Psych: Mood is stable, no new changes    Extremities: No lower extremity edema Breast:  denies any pain or lumps or nodules in either breasts All other systems were reviewed with the patient and are negative.  I have reviewed the past medical history, past surgical history, social history and family history with the patient and they are unchanged from previous note.  ALLERGIES:  is allergic to hydrocodone; morphine; and morphine and related.  MEDICATIONS:  Current Outpatient Prescriptions  Medication Sig Dispense Refill  . ALPRAZolam (XANAX) 0.5 MG tablet Take 0.5 mg by mouth 2 (two) times daily.    Marland Kitchen doxycycline (MONODOX) 100 MG capsule Take 20 mg by mouth 2 (two) times daily.    Marland Kitchen ELMIRON 100 MG capsule     . gabapentin (NEURONTIN) 100 MG capsule TAKE 1 CAPSULE(100 MG) BY MOUTH THREE TIMES DAILY 90 capsule 0  . omeprazole (PRILOSEC) 20 MG capsule Take 20 mg by mouth as needed (acid reflux).     . pravastatin (PRAVACHOL) 20 MG tablet Take 20 mg by mouth daily.    . Probiotic Product (PROBIOTIC FORMULA PO) Take 1 each by mouth daily.    . raloxifene (EVISTA) 60 MG tablet Take 1 tablet (60 mg total) by mouth daily. 90 tablet 3  . tiZANidine (ZANAFLEX) 4 MG tablet TAKE 1 TABLET BY MOUTH DAILY AT BEDTIME 90 tablet 3  . traMADol (ULTRAM) 50 MG tablet TAKE 1 TABLET BY MOUTH THREE TIMES DAILY 90 tablet 0  . vitamin B-12 (CYANOCOBALAMIN) 1000 MCG tablet Take 1,000 mcg by mouth daily.     No current facility-administered medications for this visit.    PHYSICAL EXAMINATION: ECOG PERFORMANCE STATUS: 1 - Symptomatic but completely ambulatory  Filed  Vitals:   04/05/16 1357  BP: 106/67  Pulse: 74  Temp: 97.4 F (36.3 C)  Resp: 16   Filed Weights   04/05/16 1357  Weight: 112 lb 6.4 oz (50.984 kg)    GENERAL:alert, no distress and comfortable SKIN: skin color, texture, turgor are normal, no rashes or significant lesions EYES: normal, Conjunctiva are pink and non-injected, sclera clear OROPHARYNX:no exudate, no erythema and  lips, buccal mucosa, and tongue normal  NECK: supple, thyroid normal size, non-tender, without nodularity LYMPH:  no palpable lymphadenopathy in the cervical, axillary or inguinal LUNGS: clear to auscultation and percussion with normal breathing effort HEART: regular rate & rhythm and no murmurs and no lower extremity edema ABDOMEN:abdomen soft, non-tender and normal bowel sounds MUSCULOSKELETAL:no cyanosis of digits and no clubbing  NEURO: alert & oriented x 3 with fluent speech, no focal motor/sensory deficits EXTREMITIES: No lower extremity edema BREAST: No palpable masses or nodules in either right or left breasts. No palpable axillary supraclavicular or infraclavicular adenopathy no breast tenderness or nipple discharge. (exam performed in the presence of a chaperone)  LABORATORY DATA:  I have reviewed the data as listed   Chemistry      Component Value Date/Time   NA 142 03/05/2015 1052   NA 140 03/25/2014 1855   K 4.5 03/05/2015 1052   K 4.6 03/25/2014 1855   CL 102 03/25/2014 1855   CL 106 10/24/2012 1255   CO2 27 03/05/2015 1052   CO2 25 03/25/2014 1855   BUN 19.0 03/05/2015 1052   BUN 15 03/25/2014 1855   CREATININE 0.9 03/05/2015 1052   CREATININE 1.19* 03/25/2014 1855      Component Value Date/Time   CALCIUM 9.6 03/05/2015 1052   CALCIUM 10.2 03/25/2014 1855   ALKPHOS 47 03/05/2015 1052   ALKPHOS 47 01/04/2012 1149   AST 22 03/05/2015 1052   AST 20 01/04/2012 1149   ALT 14 03/05/2015 1052   ALT 13 01/04/2012 1149   BILITOT 0.53 03/05/2015 1052   BILITOT 0.4 01/04/2012 1149       Lab Results  Component Value Date   WBC 5.6 04/05/2016   HGB 13.4 04/05/2016   HCT 41.6 04/05/2016   MCV 93.6 04/05/2016   PLT 133* 04/05/2016   NEUTROABS 3.6 04/05/2016     ASSESSMENT & PLAN:  Primary cancer of upper outer quadrant of right female breast ([place]) 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   Breast cancer surveillance: 1. Mammogram 03/15/2016 is normal breast density C 2. Breast exam 04/05/2016 is normal 3. Bone density 02/23/2015 showed osteopenia T score -1.9  Patient had previously lost significant weight because of depression related to being separated from her grandchildren. Apparently her daughter's husband had transgender transformation at that time developed into a major family problems which led to separation from her grandchildren.   Return to clinic in 1 year for follow-up and breast exams   No orders of the defined types were placed in this encounter.   The patient has a good understanding of the overall plan. she agrees with it. she will call with any problems that may develop before the next visit here.   Rulon Eisenmenger, MD 04/05/2016

## 2016-04-05 NOTE — Assessment & Plan Note (Signed)
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   Breast cancer surveillance: 1. Mammogram 03/15/2016 is normal breast density C 2. Breast exam 04/05/2016 is normal 3. Bone density 02/23/2015 showed osteopenia T score -1.9  Patient had previously lost significant weight because of depression related to being separated from her grandchildren. Apparently her daughter's husband had transgender transformation at that time developed into a major family problems which led to separation from her grandchildren.   Return to clinic in 1 year for follow-up and breast exams

## 2016-04-05 NOTE — Telephone Encounter (Signed)
Gave patient avs report and appointments for May 2018.

## 2016-04-21 DIAGNOSIS — E559 Vitamin D deficiency, unspecified: Secondary | ICD-10-CM | POA: Diagnosis not present

## 2016-04-21 DIAGNOSIS — E785 Hyperlipidemia, unspecified: Secondary | ICD-10-CM | POA: Diagnosis not present

## 2016-04-21 DIAGNOSIS — Z79899 Other long term (current) drug therapy: Secondary | ICD-10-CM | POA: Diagnosis not present

## 2016-04-21 DIAGNOSIS — N289 Disorder of kidney and ureter, unspecified: Secondary | ICD-10-CM | POA: Diagnosis not present

## 2016-04-21 DIAGNOSIS — N12 Tubulo-interstitial nephritis, not specified as acute or chronic: Secondary | ICD-10-CM | POA: Diagnosis not present

## 2016-04-21 DIAGNOSIS — Z0000 Encounter for general adult medical examination without abnormal findings: Secondary | ICD-10-CM | POA: Diagnosis not present

## 2016-05-10 DIAGNOSIS — R1314 Dysphagia, pharyngoesophageal phase: Secondary | ICD-10-CM | POA: Diagnosis not present

## 2016-05-10 DIAGNOSIS — K219 Gastro-esophageal reflux disease without esophagitis: Secondary | ICD-10-CM | POA: Diagnosis not present

## 2016-05-10 DIAGNOSIS — Z7982 Long term (current) use of aspirin: Secondary | ICD-10-CM | POA: Diagnosis not present

## 2016-05-10 DIAGNOSIS — Z87891 Personal history of nicotine dependence: Secondary | ICD-10-CM | POA: Diagnosis not present

## 2016-05-26 DIAGNOSIS — N12 Tubulo-interstitial nephritis, not specified as acute or chronic: Secondary | ICD-10-CM | POA: Diagnosis not present

## 2016-06-02 DIAGNOSIS — Z96651 Presence of right artificial knee joint: Secondary | ICD-10-CM | POA: Diagnosis not present

## 2016-06-02 DIAGNOSIS — Z9011 Acquired absence of right breast and nipple: Secondary | ICD-10-CM | POA: Diagnosis not present

## 2016-06-02 DIAGNOSIS — Z885 Allergy status to narcotic agent status: Secondary | ICD-10-CM | POA: Diagnosis not present

## 2016-06-02 DIAGNOSIS — K219 Gastro-esophageal reflux disease without esophagitis: Secondary | ICD-10-CM | POA: Diagnosis not present

## 2016-06-02 DIAGNOSIS — M479 Spondylosis, unspecified: Secondary | ICD-10-CM | POA: Diagnosis not present

## 2016-06-02 DIAGNOSIS — Z79891 Long term (current) use of opiate analgesic: Secondary | ICD-10-CM | POA: Diagnosis not present

## 2016-06-02 DIAGNOSIS — Z87442 Personal history of urinary calculi: Secondary | ICD-10-CM | POA: Diagnosis not present

## 2016-06-02 DIAGNOSIS — K449 Diaphragmatic hernia without obstruction or gangrene: Secondary | ICD-10-CM | POA: Diagnosis not present

## 2016-06-02 DIAGNOSIS — Z7982 Long term (current) use of aspirin: Secondary | ICD-10-CM | POA: Diagnosis not present

## 2016-06-02 DIAGNOSIS — N3010 Interstitial cystitis (chronic) without hematuria: Secondary | ICD-10-CM | POA: Diagnosis not present

## 2016-06-02 DIAGNOSIS — R1310 Dysphagia, unspecified: Secondary | ICD-10-CM | POA: Diagnosis not present

## 2016-06-02 DIAGNOSIS — Z853 Personal history of malignant neoplasm of breast: Secondary | ICD-10-CM | POA: Diagnosis not present

## 2016-06-02 DIAGNOSIS — R1314 Dysphagia, pharyngoesophageal phase: Secondary | ICD-10-CM | POA: Diagnosis not present

## 2016-06-02 DIAGNOSIS — K222 Esophageal obstruction: Secondary | ICD-10-CM | POA: Diagnosis not present

## 2016-06-02 DIAGNOSIS — Z981 Arthrodesis status: Secondary | ICD-10-CM | POA: Diagnosis not present

## 2016-06-02 DIAGNOSIS — Z79899 Other long term (current) drug therapy: Secondary | ICD-10-CM | POA: Diagnosis not present

## 2016-06-02 DIAGNOSIS — K580 Irritable bowel syndrome with diarrhea: Secondary | ICD-10-CM | POA: Diagnosis not present

## 2016-06-02 DIAGNOSIS — Z87891 Personal history of nicotine dependence: Secondary | ICD-10-CM | POA: Diagnosis not present

## 2016-06-16 ENCOUNTER — Ambulatory Visit: Payer: Medicare Other | Admitting: Physical Medicine & Rehabilitation

## 2016-06-16 ENCOUNTER — Ambulatory Visit: Payer: Medicare Other

## 2016-06-21 DIAGNOSIS — N12 Tubulo-interstitial nephritis, not specified as acute or chronic: Secondary | ICD-10-CM | POA: Diagnosis not present

## 2016-06-27 DIAGNOSIS — K449 Diaphragmatic hernia without obstruction or gangrene: Secondary | ICD-10-CM | POA: Diagnosis not present

## 2016-06-27 DIAGNOSIS — K222 Esophageal obstruction: Secondary | ICD-10-CM | POA: Diagnosis not present

## 2016-06-27 DIAGNOSIS — R1319 Other dysphagia: Secondary | ICD-10-CM | POA: Diagnosis not present

## 2016-06-27 DIAGNOSIS — K224 Dyskinesia of esophagus: Secondary | ICD-10-CM | POA: Diagnosis not present

## 2016-07-04 ENCOUNTER — Ambulatory Visit: Payer: Medicare Other

## 2016-07-04 ENCOUNTER — Ambulatory Visit: Payer: Medicare Other | Admitting: Physical Medicine & Rehabilitation

## 2016-07-11 ENCOUNTER — Ambulatory Visit: Payer: Medicare Other | Admitting: Physical Medicine & Rehabilitation

## 2016-07-17 ENCOUNTER — Other Ambulatory Visit: Payer: Self-pay | Admitting: Physical Medicine & Rehabilitation

## 2016-07-26 DIAGNOSIS — M1990 Unspecified osteoarthritis, unspecified site: Secondary | ICD-10-CM | POA: Diagnosis not present

## 2016-07-26 DIAGNOSIS — N184 Chronic kidney disease, stage 4 (severe): Secondary | ICD-10-CM | POA: Diagnosis not present

## 2016-07-26 DIAGNOSIS — I774 Celiac artery compression syndrome: Secondary | ICD-10-CM | POA: Diagnosis not present

## 2016-07-26 DIAGNOSIS — C50919 Malignant neoplasm of unspecified site of unspecified female breast: Secondary | ICD-10-CM | POA: Diagnosis not present

## 2016-07-26 DIAGNOSIS — E785 Hyperlipidemia, unspecified: Secondary | ICD-10-CM | POA: Diagnosis not present

## 2016-07-26 DIAGNOSIS — N183 Chronic kidney disease, stage 3 (moderate): Secondary | ICD-10-CM | POA: Diagnosis not present

## 2016-08-08 DIAGNOSIS — H6123 Impacted cerumen, bilateral: Secondary | ICD-10-CM | POA: Diagnosis not present

## 2016-08-08 DIAGNOSIS — H9193 Unspecified hearing loss, bilateral: Secondary | ICD-10-CM | POA: Diagnosis not present

## 2016-08-08 DIAGNOSIS — H9313 Tinnitus, bilateral: Secondary | ICD-10-CM | POA: Diagnosis not present

## 2016-08-11 DIAGNOSIS — Z7982 Long term (current) use of aspirin: Secondary | ICD-10-CM | POA: Diagnosis not present

## 2016-08-11 DIAGNOSIS — Z87891 Personal history of nicotine dependence: Secondary | ICD-10-CM | POA: Diagnosis not present

## 2016-08-11 DIAGNOSIS — N189 Chronic kidney disease, unspecified: Secondary | ICD-10-CM | POA: Diagnosis not present

## 2016-08-11 DIAGNOSIS — K219 Gastro-esophageal reflux disease without esophagitis: Secondary | ICD-10-CM | POA: Diagnosis not present

## 2016-08-11 DIAGNOSIS — Z87442 Personal history of urinary calculi: Secondary | ICD-10-CM | POA: Diagnosis not present

## 2016-08-11 DIAGNOSIS — Z853 Personal history of malignant neoplasm of breast: Secondary | ICD-10-CM | POA: Diagnosis not present

## 2016-08-11 DIAGNOSIS — K449 Diaphragmatic hernia without obstruction or gangrene: Secondary | ICD-10-CM | POA: Diagnosis not present

## 2016-08-15 DIAGNOSIS — Z79899 Other long term (current) drug therapy: Secondary | ICD-10-CM | POA: Diagnosis not present

## 2016-08-15 DIAGNOSIS — E559 Vitamin D deficiency, unspecified: Secondary | ICD-10-CM | POA: Diagnosis not present

## 2016-08-15 DIAGNOSIS — Z23 Encounter for immunization: Secondary | ICD-10-CM | POA: Diagnosis not present

## 2016-08-15 DIAGNOSIS — N289 Disorder of kidney and ureter, unspecified: Secondary | ICD-10-CM | POA: Diagnosis not present

## 2016-08-22 DIAGNOSIS — Z01812 Encounter for preprocedural laboratory examination: Secondary | ICD-10-CM | POA: Diagnosis not present

## 2016-08-22 DIAGNOSIS — N189 Chronic kidney disease, unspecified: Secondary | ICD-10-CM | POA: Diagnosis not present

## 2016-08-29 DIAGNOSIS — I7090 Unspecified atherosclerosis: Secondary | ICD-10-CM | POA: Diagnosis not present

## 2016-08-29 DIAGNOSIS — K589 Irritable bowel syndrome without diarrhea: Secondary | ICD-10-CM | POA: Diagnosis not present

## 2016-08-29 DIAGNOSIS — I774 Celiac artery compression syndrome: Secondary | ICD-10-CM | POA: Diagnosis not present

## 2016-08-29 DIAGNOSIS — N289 Disorder of kidney and ureter, unspecified: Secondary | ICD-10-CM | POA: Diagnosis not present

## 2016-08-29 DIAGNOSIS — E785 Hyperlipidemia, unspecified: Secondary | ICD-10-CM | POA: Diagnosis not present

## 2016-08-30 DIAGNOSIS — I7090 Unspecified atherosclerosis: Secondary | ICD-10-CM | POA: Diagnosis not present

## 2016-08-30 DIAGNOSIS — I774 Celiac artery compression syndrome: Secondary | ICD-10-CM | POA: Diagnosis not present

## 2016-08-30 DIAGNOSIS — Z01810 Encounter for preprocedural cardiovascular examination: Secondary | ICD-10-CM | POA: Diagnosis not present

## 2016-08-31 DIAGNOSIS — K219 Gastro-esophageal reflux disease without esophagitis: Secondary | ICD-10-CM | POA: Diagnosis not present

## 2016-08-31 DIAGNOSIS — Z96659 Presence of unspecified artificial knee joint: Secondary | ICD-10-CM | POA: Diagnosis not present

## 2016-08-31 DIAGNOSIS — K580 Irritable bowel syndrome with diarrhea: Secondary | ICD-10-CM | POA: Diagnosis not present

## 2016-08-31 DIAGNOSIS — M47816 Spondylosis without myelopathy or radiculopathy, lumbar region: Secondary | ICD-10-CM | POA: Diagnosis not present

## 2016-08-31 DIAGNOSIS — Z79899 Other long term (current) drug therapy: Secondary | ICD-10-CM | POA: Diagnosis not present

## 2016-08-31 DIAGNOSIS — I959 Hypotension, unspecified: Secondary | ICD-10-CM | POA: Diagnosis not present

## 2016-08-31 DIAGNOSIS — Z853 Personal history of malignant neoplasm of breast: Secondary | ICD-10-CM | POA: Diagnosis not present

## 2016-08-31 DIAGNOSIS — Z431 Encounter for attention to gastrostomy: Secondary | ICD-10-CM | POA: Diagnosis not present

## 2016-08-31 DIAGNOSIS — N189 Chronic kidney disease, unspecified: Secondary | ICD-10-CM | POA: Diagnosis not present

## 2016-08-31 DIAGNOSIS — R918 Other nonspecific abnormal finding of lung field: Secondary | ICD-10-CM | POA: Diagnosis not present

## 2016-08-31 DIAGNOSIS — Z7902 Long term (current) use of antithrombotics/antiplatelets: Secondary | ICD-10-CM | POA: Diagnosis not present

## 2016-08-31 DIAGNOSIS — K449 Diaphragmatic hernia without obstruction or gangrene: Secondary | ICD-10-CM | POA: Diagnosis not present

## 2016-08-31 DIAGNOSIS — J449 Chronic obstructive pulmonary disease, unspecified: Secondary | ICD-10-CM | POA: Diagnosis not present

## 2016-08-31 DIAGNOSIS — Z7982 Long term (current) use of aspirin: Secondary | ICD-10-CM | POA: Diagnosis not present

## 2016-09-15 DIAGNOSIS — K449 Diaphragmatic hernia without obstruction or gangrene: Secondary | ICD-10-CM | POA: Diagnosis not present

## 2016-09-15 DIAGNOSIS — Z48815 Encounter for surgical aftercare following surgery on the digestive system: Secondary | ICD-10-CM | POA: Diagnosis not present

## 2016-10-18 ENCOUNTER — Other Ambulatory Visit: Payer: Self-pay | Admitting: Physical Medicine & Rehabilitation

## 2016-10-20 DIAGNOSIS — Z8719 Personal history of other diseases of the digestive system: Secondary | ICD-10-CM | POA: Diagnosis not present

## 2016-10-20 DIAGNOSIS — Z431 Encounter for attention to gastrostomy: Secondary | ICD-10-CM | POA: Diagnosis not present

## 2016-10-20 DIAGNOSIS — Z48815 Encounter for surgical aftercare following surgery on the digestive system: Secondary | ICD-10-CM | POA: Diagnosis not present

## 2016-11-04 DIAGNOSIS — N12 Tubulo-interstitial nephritis, not specified as acute or chronic: Secondary | ICD-10-CM | POA: Diagnosis not present

## 2016-11-04 DIAGNOSIS — N183 Chronic kidney disease, stage 3 (moderate): Secondary | ICD-10-CM | POA: Diagnosis not present

## 2016-11-04 DIAGNOSIS — M545 Low back pain: Secondary | ICD-10-CM | POA: Diagnosis not present

## 2016-11-04 DIAGNOSIS — N179 Acute kidney failure, unspecified: Secondary | ICD-10-CM | POA: Diagnosis not present

## 2016-11-04 DIAGNOSIS — N2581 Secondary hyperparathyroidism of renal origin: Secondary | ICD-10-CM | POA: Diagnosis not present

## 2016-11-18 DIAGNOSIS — J918 Pleural effusion in other conditions classified elsewhere: Secondary | ICD-10-CM | POA: Diagnosis not present

## 2016-11-18 DIAGNOSIS — R05 Cough: Secondary | ICD-10-CM | POA: Diagnosis not present

## 2016-11-29 DIAGNOSIS — J189 Pneumonia, unspecified organism: Secondary | ICD-10-CM | POA: Diagnosis not present

## 2016-11-29 DIAGNOSIS — R091 Pleurisy: Secondary | ICD-10-CM | POA: Diagnosis not present

## 2016-11-29 DIAGNOSIS — R05 Cough: Secondary | ICD-10-CM | POA: Diagnosis not present

## 2016-11-29 DIAGNOSIS — E559 Vitamin D deficiency, unspecified: Secondary | ICD-10-CM | POA: Diagnosis not present

## 2016-11-29 DIAGNOSIS — N289 Disorder of kidney and ureter, unspecified: Secondary | ICD-10-CM | POA: Diagnosis not present

## 2016-12-12 DIAGNOSIS — I7090 Unspecified atherosclerosis: Secondary | ICD-10-CM | POA: Diagnosis not present

## 2016-12-12 DIAGNOSIS — N289 Disorder of kidney and ureter, unspecified: Secondary | ICD-10-CM | POA: Diagnosis not present

## 2016-12-12 DIAGNOSIS — E785 Hyperlipidemia, unspecified: Secondary | ICD-10-CM | POA: Diagnosis not present

## 2016-12-21 DIAGNOSIS — Z139 Encounter for screening, unspecified: Secondary | ICD-10-CM | POA: Diagnosis not present

## 2016-12-21 DIAGNOSIS — E785 Hyperlipidemia, unspecified: Secondary | ICD-10-CM | POA: Diagnosis not present

## 2016-12-21 DIAGNOSIS — I700 Atherosclerosis of aorta: Secondary | ICD-10-CM | POA: Diagnosis not present

## 2016-12-21 DIAGNOSIS — E559 Vitamin D deficiency, unspecified: Secondary | ICD-10-CM | POA: Diagnosis not present

## 2016-12-21 DIAGNOSIS — N289 Disorder of kidney and ureter, unspecified: Secondary | ICD-10-CM | POA: Diagnosis not present

## 2017-01-26 ENCOUNTER — Ambulatory Visit (HOSPITAL_BASED_OUTPATIENT_CLINIC_OR_DEPARTMENT_OTHER): Payer: Medicare Other | Admitting: Physical Medicine & Rehabilitation

## 2017-01-26 ENCOUNTER — Encounter: Payer: Self-pay | Admitting: Physical Medicine & Rehabilitation

## 2017-01-26 ENCOUNTER — Encounter: Payer: Medicare Other | Attending: Physical Medicine & Rehabilitation

## 2017-01-26 VITALS — BP 110/77 | HR 64 | Resp 14

## 2017-01-26 DIAGNOSIS — G8929 Other chronic pain: Secondary | ICD-10-CM | POA: Insufficient documentation

## 2017-01-26 DIAGNOSIS — Z803 Family history of malignant neoplasm of breast: Secondary | ICD-10-CM | POA: Diagnosis not present

## 2017-01-26 DIAGNOSIS — M1990 Unspecified osteoarthritis, unspecified site: Secondary | ICD-10-CM | POA: Insufficient documentation

## 2017-01-26 DIAGNOSIS — Z801 Family history of malignant neoplasm of trachea, bronchus and lung: Secondary | ICD-10-CM | POA: Diagnosis not present

## 2017-01-26 DIAGNOSIS — M7062 Trochanteric bursitis, left hip: Secondary | ICD-10-CM

## 2017-01-26 DIAGNOSIS — M7061 Trochanteric bursitis, right hip: Secondary | ICD-10-CM | POA: Insufficient documentation

## 2017-01-26 DIAGNOSIS — Z90710 Acquired absence of both cervix and uterus: Secondary | ICD-10-CM | POA: Insufficient documentation

## 2017-01-26 DIAGNOSIS — M791 Myalgia: Secondary | ICD-10-CM | POA: Insufficient documentation

## 2017-01-26 DIAGNOSIS — Z87891 Personal history of nicotine dependence: Secondary | ICD-10-CM | POA: Diagnosis not present

## 2017-01-26 DIAGNOSIS — Z981 Arthrodesis status: Secondary | ICD-10-CM | POA: Insufficient documentation

## 2017-01-26 DIAGNOSIS — Z853 Personal history of malignant neoplasm of breast: Secondary | ICD-10-CM | POA: Diagnosis not present

## 2017-01-26 DIAGNOSIS — Z923 Personal history of irradiation: Secondary | ICD-10-CM | POA: Diagnosis not present

## 2017-01-26 DIAGNOSIS — E7800 Pure hypercholesterolemia, unspecified: Secondary | ICD-10-CM | POA: Diagnosis not present

## 2017-01-26 DIAGNOSIS — Z96659 Presence of unspecified artificial knee joint: Secondary | ICD-10-CM | POA: Insufficient documentation

## 2017-01-26 DIAGNOSIS — M7918 Myalgia, other site: Secondary | ICD-10-CM

## 2017-01-26 DIAGNOSIS — Z8049 Family history of malignant neoplasm of other genital organs: Secondary | ICD-10-CM | POA: Insufficient documentation

## 2017-01-26 NOTE — Progress Notes (Signed)
Subjective:    Patient ID: Sabrina Sullivan, female    DOB: [birth year], 76 y.o.   MRN: [id number]  HPI Hip and buttocks pain doing better Last visit 02/2016 [place] ~3 mo ago but no significant injury  No longer taking tramadol or tizanidine Gabapentin at hs only  Paraesophageal hernia repair, laparoscopic Oct 2017 Pain Inventory Average Pain 7 Pain Right Now 5 My pain is burning and aching  In the last 24 hours, has pain interfered with the following? General activity 5 Relation with others 5 Enjoyment of life 7 What TIME of day is your pain at its worst? daytime , evening Sleep (in general) Fair  Pain is worse with: bending, sitting and some activites Pain improves with: rest, heat/ice and injections Relief from Meds: n/a  Mobility walk without assistance how many minutes can you walk? ? ability to climb steps?  yes do you drive?  yes Do you have any goals in this area?  no  Function Do you have any goals in this area?  no  Neuro/Psych No problems in this area  Prior Studies Any changes since last visit?  no  Physicians involved in your care Any changes since last visit?  no   Family History  Problem Relation Age of Onset  . Cancer Mother     uterine  . Cancer Maternal Uncle     lung  . Cancer Daughter     breast   Social History   Social History  . Marital status: Divorced    Spouse name: N/A  . Number of children: N/A  . Years of education: N/A   Occupational History  . Retired/Disability    Social History Main Topics  . Smoking status: Former Smoker    Packs/day: 0.50    Quit date: 01/22/2008  . Smokeless tobacco: Never Used  . Alcohol use Yes  . Drug use: No  . 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 None   Social History Narrative  . None   Past Surgical History:  Procedure Laterality Date  . ABDOMINAL HYSTERECTOMY  1970   has ovaries  . BOTOX INJECTION N/A 02/23/2015   Procedure:  BOTOX INJECTION;  Surgeon: Garlan Fair, MD;  Location: [facility];  Service: Endoscopy;  Laterality: N/A;  . BREAST LUMPECTOMY  02/02/2012   right  . CERVICAL FUSION    . ESOPHAGEAL MANOMETRY N/A 04/07/2014   Procedure: ESOPHAGEAL MANOMETRY (EM);  Surgeon: Garlan Fair, MD;  Location: [facility];  Service: Endoscopy;  Laterality: N/A;  . ESOPHAGOGASTRODUODENOSCOPY N/A 02/23/2015   Procedure: ESOPHAGOGASTRODUODENOSCOPY (EGD);  Surgeon: Garlan Fair, MD;  Location: [facility];  Service: Endoscopy;  Laterality: N/A;  . JOINT REPLACEMENT    . REPLACEMENT TOTAL KNEE    . ROTATOR CUFF REPAIR    . TONSILLECTOMY     Past Medical History:  Diagnosis Date  . Arthritis 01/27/2012   OSTEOARTHRITIS  . Cancer [facility])    breast- right  . Complication of anesthesia 2006   POST OP HALLUCINATIONS AND CONFUSIION /"MAYBE SOMETHING FOR PAIN "  . Dental crowns present   . Ear disorder    TINNITIS  . GERD (gastroesophageal reflux disease)   . High cholesterol   . History of radiation therapy 03/22/12-04/19/12   right breast  . Left arm pain   . Low back pain   . Neck pain   . Rosacea   .  Thoracic back pain   . Urine frequency    AND PT GETS UP AT NIGHT   BP 110/77   Pulse 64   Resp 14   SpO2 99%   Opioid Risk Score:   Fall Risk Score:  `1  Depression screen PHQ 2/9  Depression screen [facility] 2/9 03/24/2016 02/26/2016 07/14/2015  Decreased Interest 0 2 1  Down, Depressed, Hopeless 0 1 1  PHQ - 2 Score 0 3 2  Altered sleeping - 1 0  Tired, decreased energy - 1 0  Change in appetite - 0 0  Feeling bad or failure about yourself  - 0 0  Trouble concentrating - 0 0  Moving slowly or fidgety/restless - 0 0  Suicidal thoughts - 0 0  PHQ-9 Score - 5 2  Difficult doing work/chores - - Not difficult at all  Some recent data might be hidden    Review of Systems  Constitutional: Positive for unexpected weight change.  HENT: Negative.   Eyes: Negative.   Respiratory: Negative.     Cardiovascular: Positive for leg swelling.  Gastrointestinal: Negative.   Endocrine: Negative.   Genitourinary: Negative.   Musculoskeletal: Positive for back pain.  Skin: Negative.   Allergic/Immunologic: Negative.   Neurological: Negative.   Hematological: Negative.   Psychiatric/Behavioral: Negative.   All other systems reviewed and are negative.      Objective:   Physical Exam  Constitutional: She is oriented to person, place, and time. She appears well-developed and well-nourished.  HENT:  Head: Normocephalic and atraumatic.  Eyes: EOM are normal. Pupils are equal, round, and reactive to light.  Neurological: She is alert and oriented to person, place, and time.  Psychiatric: She has a normal mood and affect.  Nursing note and vitals reviewed.  Normal. Lumbar spine range of motion. No tenderness over the lumbar paraspinals, no tenderness over the greater trochanters, no tenderness over the piriformis area. Negative straight leg raise. Strength is normal. Bilateral lower limbs. Mild left ankle swelling. No evidence of ecchymosis. No pain with range of motion.       Assessment & Plan:  1.  History of trochanteric bursitis.Currently asymptomatic, pt states she has changed the way she sits and lays in bed.  2.  History of Piriformis syndrome resolved after injections, Pt may follow up prn for injections if flare up of pain.

## 2017-01-30 ENCOUNTER — Ambulatory Visit: Payer: Medicare Other | Admitting: Dietician

## 2017-02-20 DIAGNOSIS — M25572 Pain in left ankle and joints of left foot: Secondary | ICD-10-CM | POA: Diagnosis not present

## 2017-02-20 DIAGNOSIS — R600 Localized edema: Secondary | ICD-10-CM | POA: Diagnosis not present

## 2017-02-22 DIAGNOSIS — M25572 Pain in left ankle and joints of left foot: Secondary | ICD-10-CM | POA: Diagnosis not present

## 2017-03-20 DIAGNOSIS — Z853 Personal history of malignant neoplasm of breast: Secondary | ICD-10-CM | POA: Diagnosis not present

## 2017-03-20 DIAGNOSIS — N289 Disorder of kidney and ureter, unspecified: Secondary | ICD-10-CM | POA: Diagnosis not present

## 2017-03-20 DIAGNOSIS — Z79899 Other long term (current) drug therapy: Secondary | ICD-10-CM | POA: Diagnosis not present

## 2017-03-20 DIAGNOSIS — M545 Low back pain: Secondary | ICD-10-CM | POA: Diagnosis not present

## 2017-03-20 DIAGNOSIS — R252 Cramp and spasm: Secondary | ICD-10-CM | POA: Diagnosis not present

## 2017-03-24 ENCOUNTER — Other Ambulatory Visit: Payer: Self-pay | Admitting: General Surgery

## 2017-03-24 DIAGNOSIS — Z853 Personal history of malignant neoplasm of breast: Secondary | ICD-10-CM

## 2017-04-04 ENCOUNTER — Ambulatory Visit
Admission: RE | Admit: 2017-04-04 | Discharge: 2017-04-04 | Disposition: A | Discharge status: Home / Self Care | Payer: Medicare Other | Source: Ambulatory Visit | Attending: General Surgery | Admitting: General Surgery

## 2017-04-04 ENCOUNTER — Other Ambulatory Visit: Payer: Self-pay | Admitting: General Surgery

## 2017-04-04 ENCOUNTER — Ambulatory Visit (HOSPITAL_BASED_OUTPATIENT_CLINIC_OR_DEPARTMENT_OTHER): Payer: Medicare Other | Admitting: Hematology and Oncology

## 2017-04-04 ENCOUNTER — Encounter: Payer: Self-pay | Admitting: Hematology and Oncology

## 2017-04-04 DIAGNOSIS — Z853 Personal history of malignant neoplasm of breast: Secondary | ICD-10-CM

## 2017-04-04 DIAGNOSIS — N6002 Solitary cyst of left breast: Secondary | ICD-10-CM | POA: Diagnosis not present

## 2017-04-04 DIAGNOSIS — R928 Other abnormal and inconclusive findings on diagnostic imaging of breast: Secondary | ICD-10-CM | POA: Diagnosis not present

## 2017-04-04 DIAGNOSIS — N6320 Unspecified lump in the left breast, unspecified quadrant: Secondary | ICD-10-CM

## 2017-04-04 DIAGNOSIS — C50411 Malignant neoplasm of upper-outer quadrant of right female breast: Secondary | ICD-10-CM

## 2017-04-04 HISTORY — DX: Malignant neoplasm of unspecified site of unspecified female breast: C50.919

## 2017-04-04 HISTORY — DX: Personal history of irradiation: Z92.3

## 2017-04-04 NOTE — Assessment & Plan Note (Signed)
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   Breast cancer surveillance: 1. Mammogram 04/04/2017 2. Breast exam 04/04/2017 is normal 3. Bone density 02/23/2015 showed osteopenia T score -1.9  Patient had previously lost significant weight because of depression related to being separated from her grandchildren. Apparently her daughter's husband had transgender transformation at that time developed into a major family problems which led to separation from her grandchildren.   Return to clinic in 1 year for follow-up and breast exams

## 2017-04-04 NOTE — Progress Notes (Signed)
Patient Care Team: Nicholos Johns, MD as PCP - General (Internal Medicine)  DIAGNOSIS:  Encounter Diagnosis  Name Primary?  . Primary cancer of upper outer quadrant of right female breast ([place])     SUMMARY OF ONCOLOGIC HISTORY:   Primary cancer of upper outer quadrant of right female breast ([place])   01/04/2012 Initial Diagnosis    Cancer of upper-outer quadrant of female breast, Right [id number]      02/02/2012 Surgery    Right breast lumpectomy with SLN biopsy, IDC low-grade 0.7 cm with intermediate grade DCIS, 2 SLN negative ER 65%, PR negative, HER-2 negative, Ki-67 12%      03/26/2012 - 04/26/2012 Radiation Therapy    Radiation therapy to lumpectomy site by Dr. Gery Pray       Anti-estrogen oral therapy    Patient declined antiestrogen therapy       CHIEF COMPLIANT: Surveillance of breast cancer  INTERVAL HISTORY: Sabrina Sullivan is a 76 year old with above-mentioned history of right breast cancer treated with lumpectomy and radiation. She declined antiestrogen therapy. She is currently on surveillance. She had a mammogram this morning and it was normal apart from a small space sebaceous cyst. She is mentally doing much better. Her primary care physician has done a remarkable job and improving her emotional state. She denies any lumps or nodules in the breasts.  REVIEW OF SYSTEMS:   Constitutional: Denies fevers, chills or abnormal weight loss Eyes: Denies blurriness of vision Ears, nose, mouth, throat, and face: Denies mucositis or sore throat Respiratory: Denies cough, dyspnea or wheezes Cardiovascular: Denies palpitation, chest discomfort Gastrointestinal:  Denies nausea, heartburn or change in bowel habits Skin: Denies abnormal skin rashes Lymphatics: Denies new lymphadenopathy or easy bruising Neurological:Denies numbness, tingling or new weaknesses Behavioral/Psych: Mood is stable, no new changes  Extremities: No lower extremity edema Breast:  denies any pain or lumps  or nodules in either breasts All other systems were reviewed with the patient and are negative.  I have reviewed the past medical history, past surgical history, social history and family history with the patient and they are unchanged from previous note.  ALLERGIES:  is allergic to hydrocodone; morphine; and morphine and related.  MEDICATIONS:  Current Outpatient Prescriptions  Medication Sig Dispense Refill  . buPROPion (WELLBUTRIN XL) 150 MG 24 hr tablet Take 150 mg by mouth daily.    Marland Kitchen ELMIRON 100 MG capsule     . gabapentin (NEURONTIN) 100 MG capsule TAKE 1 CAPSULE(100 MG) BY MOUTH THREE TIMES DAILY 90 capsule 0  . pravastatin (PRAVACHOL) 20 MG tablet Take 20 mg by mouth daily.    . Probiotic Product (PROBIOTIC FORMULA PO) Take 1 each by mouth daily.    . raloxifene (EVISTA) 60 MG tablet Take 1 tablet (60 mg total) by mouth daily. 90 tablet 3  . sertraline (ZOLOFT) 50 MG tablet Take 50 mg by mouth daily.    Marland Kitchen tiZANidine (ZANAFLEX) 4 MG tablet TAKE 1 TABLET BY MOUTH DAILY AT BEDTIME (Patient not taking: Reported on 01/26/2017) 90 tablet 0  . traMADol (ULTRAM) 50 MG tablet TAKE 1 TABLET BY MOUTH THREE TIMES DAILY (Patient not taking: Reported on 01/26/2017) 90 tablet 0  . vitamin B-12 (CYANOCOBALAMIN) 1000 MCG tablet Take 1,000 mcg by mouth daily.     No current facility-administered medications for this visit.     PHYSICAL EXAMINATION: ECOG PERFORMANCE STATUS: 1 - Symptomatic but completely ambulatory  Vitals:   04/04/17 1346  BP: 135/80  Pulse: 72  Resp:  18  Temp: 97.8 F (36.6 C)   Filed Weights   04/04/17 1346  Weight: 134 lb 1.6 oz (60.8 kg)    GENERAL:alert, no distress and comfortable SKIN: skin color, texture, turgor are normal, no rashes or significant lesions EYES: normal, Conjunctiva are pink and non-injected, sclera clear OROPHARYNX:no exudate, no erythema and lips, buccal mucosa, and tongue normal  NECK: supple, thyroid normal size, non-tender, without  nodularity LYMPH:  no palpable lymphadenopathy in the cervical, axillary or inguinal LUNGS: clear to auscultation and percussion with normal breathing effort HEART: regular rate & rhythm and no murmurs and no lower extremity edema ABDOMEN:abdomen soft, non-tender and normal bowel sounds MUSCULOSKELETAL:no cyanosis of digits and no clubbing  NEURO: alert & oriented x 3 with fluent speech, no focal motor/sensory deficits EXTREMITIES: No lower extremity edema  LABORATORY DATA:  I have reviewed the data as listed   Chemistry      Component Value Date/Time   NA 141 04/05/2016 1347   K 4.2 04/05/2016 1347   CL 102 03/25/2014 1855   CL 106 10/24/2012 1255   CO2 27 04/05/2016 1347   BUN 24.4 04/05/2016 1347   CREATININE 1.2 (H) 04/05/2016 1347      Component Value Date/Time   CALCIUM 9.8 04/05/2016 1347   ALKPHOS 54 04/05/2016 1347   AST 20 04/05/2016 1347   ALT 16 04/05/2016 1347   BILITOT 0.36 04/05/2016 1347       Lab Results  Component Value Date   WBC 5.6 04/05/2016   HGB 13.4 04/05/2016   HCT 41.6 04/05/2016   MCV 93.6 04/05/2016   PLT 133 (L) 04/05/2016   NEUTROABS 3.6 04/05/2016    ASSESSMENT & PLAN:  Primary cancer of upper outer quadrant of right female breast ([place]) 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   Breast cancer surveillance: 1. Mammogram 04/04/2017 2. Breast exam 04/04/2017 is normal 3. Bone density 02/23/2015 showed osteopenia T score -1.9  Depression has improved remarkably on Wellbutrin  Patient wishes to follow up with her primary care physician for annual checkups. She'll be seen by [place] on an as-needed basis.  I spent 15 minutes talking to the patient of which more than half was spent in counseling and coordination of care.  No orders of the defined types were placed in  this encounter.  The patient has a good understanding of the overall plan. she agrees with it. she will call with any problems that may develop before the next visit here.   Rulon Eisenmenger, MD 04/04/17

## 2017-04-05 DIAGNOSIS — M8589 Other specified disorders of bone density and structure, multiple sites: Secondary | ICD-10-CM | POA: Diagnosis not present

## 2017-04-05 DIAGNOSIS — M85852 Other specified disorders of bone density and structure, left thigh: Secondary | ICD-10-CM | POA: Diagnosis not present

## 2017-04-25 DIAGNOSIS — H6123 Impacted cerumen, bilateral: Secondary | ICD-10-CM | POA: Diagnosis not present

## 2017-05-05 DIAGNOSIS — H9193 Unspecified hearing loss, bilateral: Secondary | ICD-10-CM | POA: Diagnosis not present

## 2017-05-05 DIAGNOSIS — B349 Viral infection, unspecified: Secondary | ICD-10-CM | POA: Diagnosis not present

## 2017-05-09 DIAGNOSIS — H9313 Tinnitus, bilateral: Secondary | ICD-10-CM | POA: Diagnosis not present

## 2017-05-09 DIAGNOSIS — B349 Viral infection, unspecified: Secondary | ICD-10-CM | POA: Diagnosis not present

## 2017-05-09 DIAGNOSIS — H903 Sensorineural hearing loss, bilateral: Secondary | ICD-10-CM | POA: Diagnosis not present

## 2017-05-14 DIAGNOSIS — S20219A Contusion of unspecified front wall of thorax, initial encounter: Secondary | ICD-10-CM | POA: Diagnosis not present

## 2017-06-18 IMAGING — MG 2D DIGITAL DIAGNOSTIC BILATERAL MAMMOGRAM WITH CAD AND ADJUNCT T
8 of 14 series · 8 of 30 positions shown · non-contrast
Comparison: Previous exam(s).

CLINICAL DATA: Patient with history of right breast lumpectomy.

EXAM:
2D DIGITAL DIAGNOSTIC BILATERAL MAMMOGRAM WITH CAD AND ADJUNCT TOMO
ULTRASOUND LEFT BREAST

[R MLO (1 of 3)]
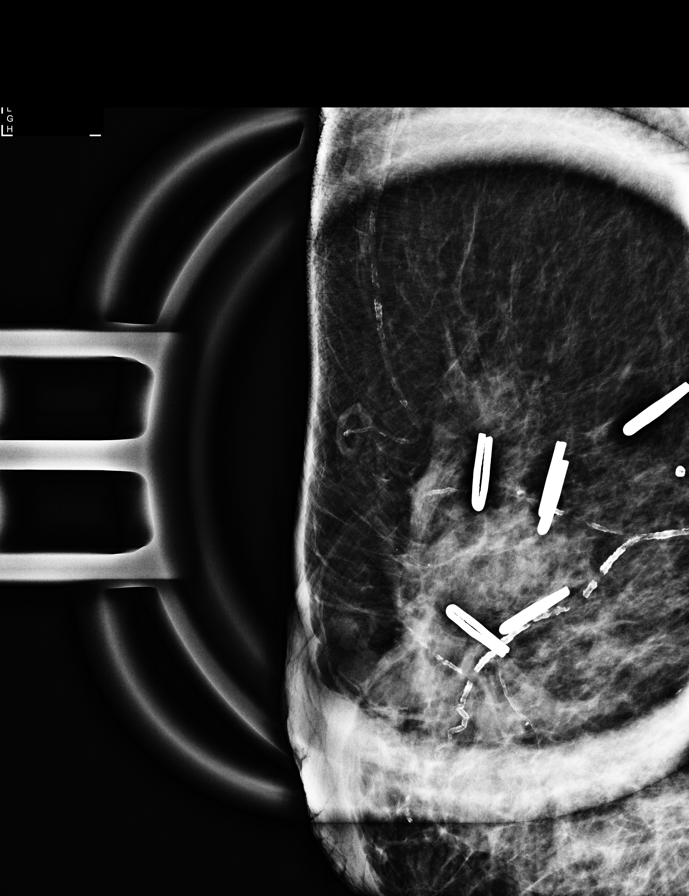

[R MLO (2 of 3)]
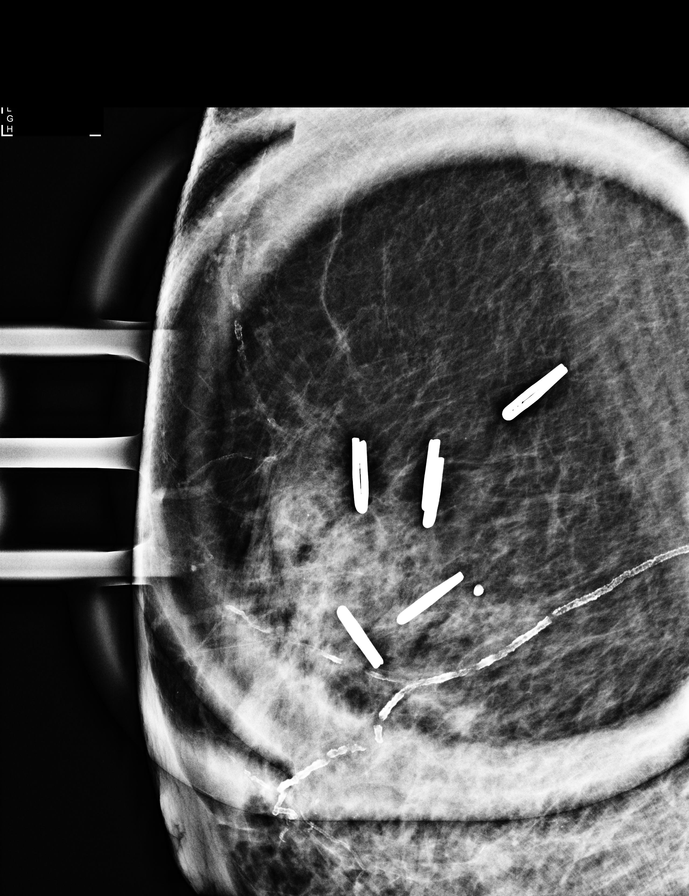

[R CC synth-2D]
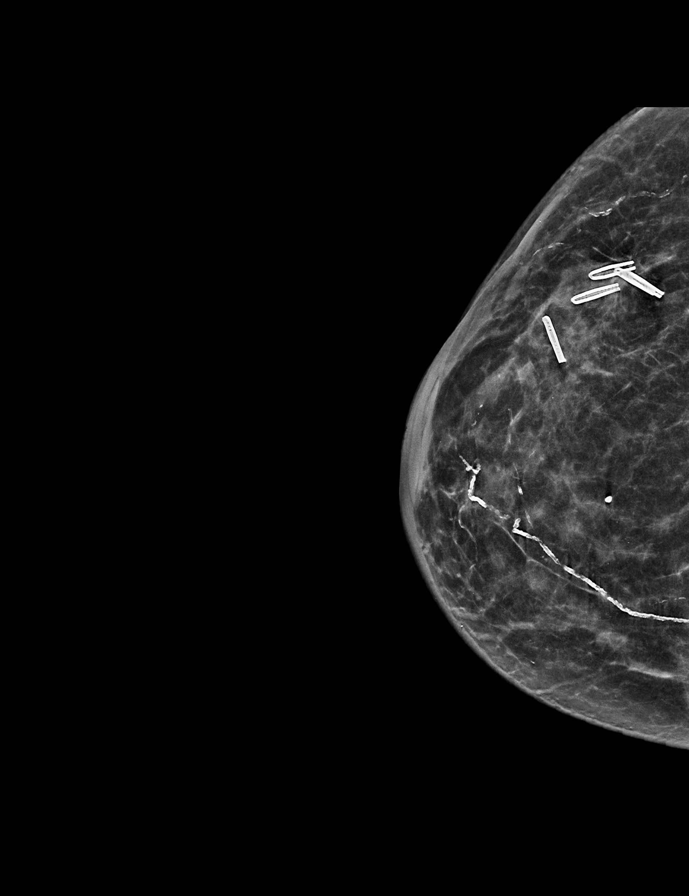

[L CC synth-2D]
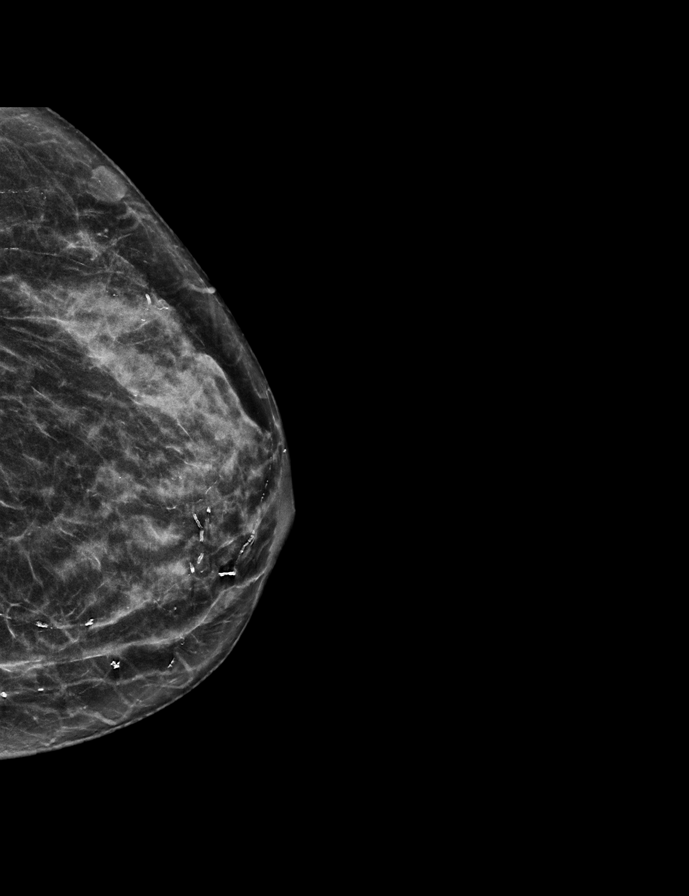

[L MLO]
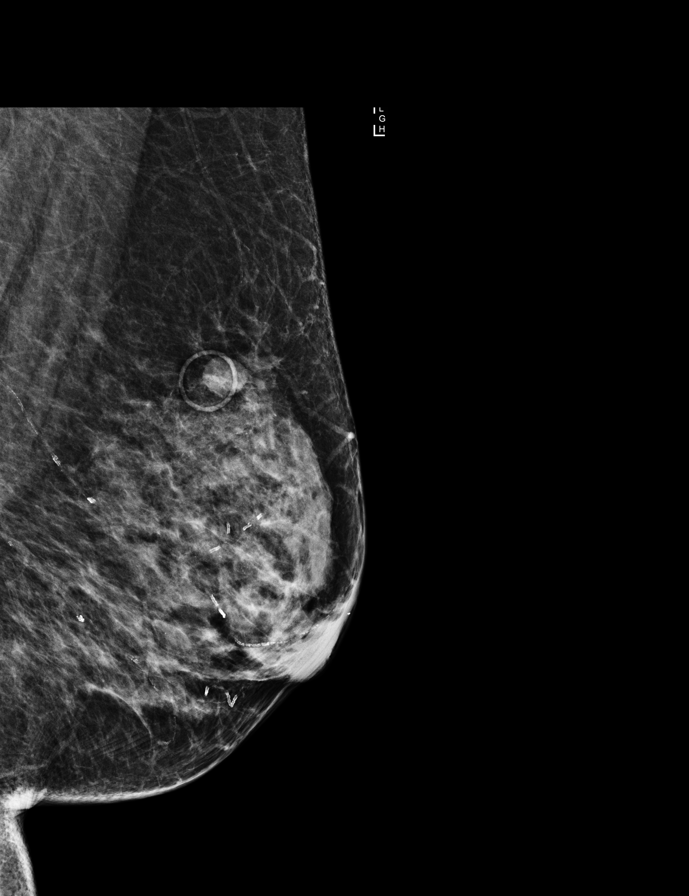

[R MLO (3 of 3)]
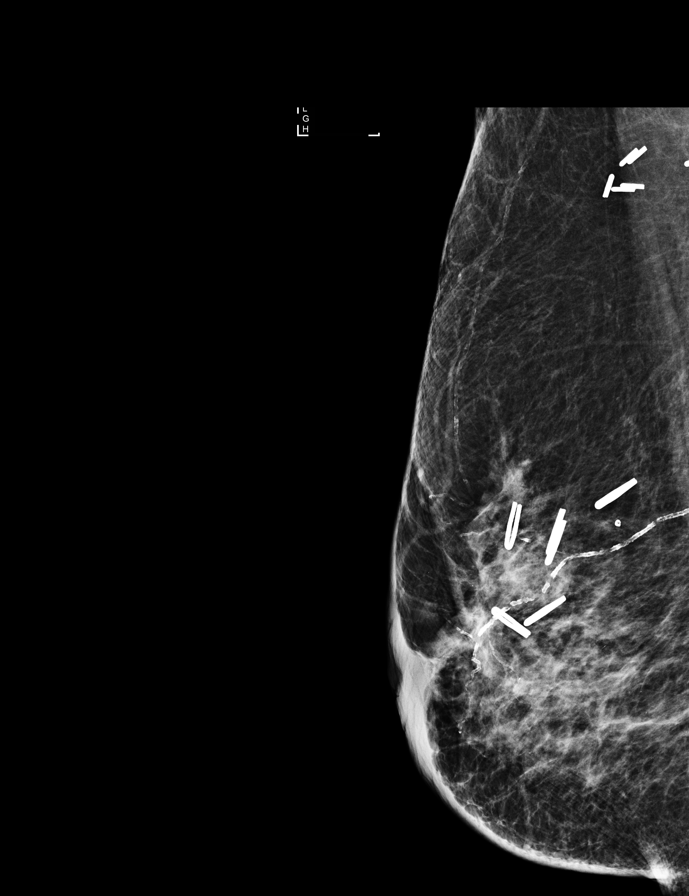

[R MLO synth-2D]
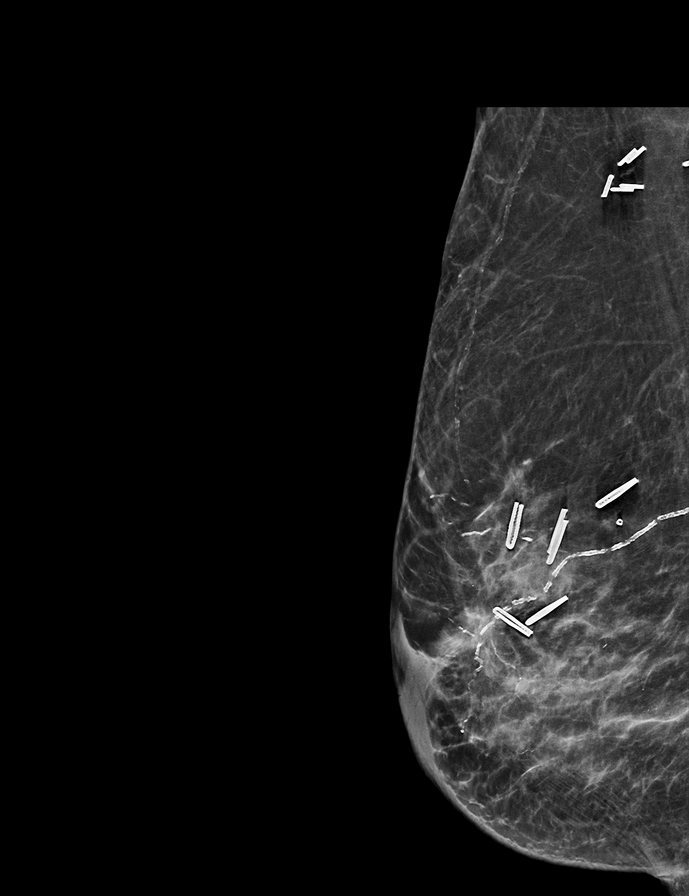

[R CC]
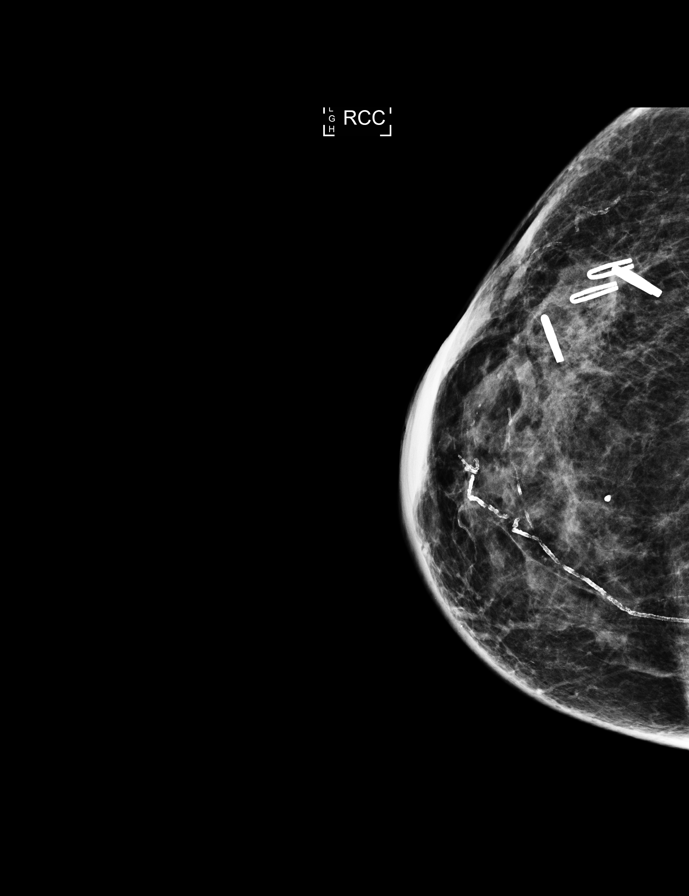

[8 of 30 positions shown; findings below may reference images not displayed]

ACR Breast Density Category c: The breast tissue is heterogeneously
dense, which may obscure small masses.
FINDINGS: Stable postlumpectomy changes right breast. There is an enlarging
mass within the upper-outer left breast. No additional concerning
masses, calcifications or nonsurgical distortion identified within
either breast.

Mammographic images were processed with CAD.

On physical exam, I palpate a small mobile subcutaneous mass within
the upper-outer left breast.

Targeted ultrasound is performed, showing a 1.1 x 0.7 x 1.0 cm oval
circumscribed near anechoic mass left breast 2 o'clock position 6 cm
from nipple, compatible with a large sebaceous cyst, corresponding
with mammographic abnormality.
IMPRESSION: Benign sebaceous cyst left breast.

No mammographic evidence for malignancy.

Stable lumpectomy changes right breast.

RECOMMENDATION:
Bilateral diagnostic mammography 1 year.

I have discussed the findings and recommendations with the patient.
Results were also provided in writing at the conclusion of the
visit. If applicable, a reminder letter will be sent to the patient
regarding the next appointment.

BI-RADS CATEGORY  2: Benign.

## 2017-07-10 DIAGNOSIS — N183 Chronic kidney disease, stage 3 (moderate): Secondary | ICD-10-CM | POA: Diagnosis not present

## 2017-07-10 DIAGNOSIS — N179 Acute kidney failure, unspecified: Secondary | ICD-10-CM | POA: Diagnosis not present

## 2017-07-10 DIAGNOSIS — E785 Hyperlipidemia, unspecified: Secondary | ICD-10-CM | POA: Diagnosis not present

## 2017-07-10 DIAGNOSIS — N12 Tubulo-interstitial nephritis, not specified as acute or chronic: Secondary | ICD-10-CM | POA: Diagnosis not present

## 2017-07-10 DIAGNOSIS — N2581 Secondary hyperparathyroidism of renal origin: Secondary | ICD-10-CM | POA: Diagnosis not present

## 2017-07-18 DIAGNOSIS — N3010 Interstitial cystitis (chronic) without hematuria: Secondary | ICD-10-CM | POA: Diagnosis not present

## 2017-07-20 DIAGNOSIS — J309 Allergic rhinitis, unspecified: Secondary | ICD-10-CM | POA: Diagnosis not present

## 2017-07-20 DIAGNOSIS — E559 Vitamin D deficiency, unspecified: Secondary | ICD-10-CM | POA: Diagnosis not present

## 2017-07-20 DIAGNOSIS — E785 Hyperlipidemia, unspecified: Secondary | ICD-10-CM | POA: Diagnosis not present

## 2017-07-20 DIAGNOSIS — Z79899 Other long term (current) drug therapy: Secondary | ICD-10-CM | POA: Diagnosis not present

## 2017-07-20 DIAGNOSIS — M545 Low back pain: Secondary | ICD-10-CM | POA: Diagnosis not present

## 2017-07-20 DIAGNOSIS — N289 Disorder of kidney and ureter, unspecified: Secondary | ICD-10-CM | POA: Diagnosis not present

## 2017-08-01 DIAGNOSIS — R1011 Right upper quadrant pain: Secondary | ICD-10-CM | POA: Diagnosis not present

## 2017-08-26 DIAGNOSIS — Z87442 Personal history of urinary calculi: Secondary | ICD-10-CM | POA: Diagnosis not present

## 2017-08-26 DIAGNOSIS — R102 Pelvic and perineal pain: Secondary | ICD-10-CM | POA: Diagnosis not present

## 2017-08-26 DIAGNOSIS — Z79899 Other long term (current) drug therapy: Secondary | ICD-10-CM | POA: Diagnosis not present

## 2017-08-26 DIAGNOSIS — Z7982 Long term (current) use of aspirin: Secondary | ICD-10-CM | POA: Diagnosis not present

## 2017-08-26 DIAGNOSIS — Z87891 Personal history of nicotine dependence: Secondary | ICD-10-CM | POA: Diagnosis not present

## 2017-08-26 DIAGNOSIS — R3915 Urgency of urination: Secondary | ICD-10-CM | POA: Diagnosis not present

## 2017-08-26 DIAGNOSIS — N816 Rectocele: Secondary | ICD-10-CM | POA: Diagnosis not present

## 2017-08-26 DIAGNOSIS — N8110 Cystocele, unspecified: Secondary | ICD-10-CM | POA: Diagnosis not present

## 2017-08-26 DIAGNOSIS — K219 Gastro-esophageal reflux disease without esophagitis: Secondary | ICD-10-CM | POA: Diagnosis not present

## 2017-08-26 DIAGNOSIS — N189 Chronic kidney disease, unspecified: Secondary | ICD-10-CM | POA: Diagnosis not present

## 2017-08-26 DIAGNOSIS — K449 Diaphragmatic hernia without obstruction or gangrene: Secondary | ICD-10-CM | POA: Diagnosis not present

## 2017-08-26 DIAGNOSIS — Z931 Gastrostomy status: Secondary | ICD-10-CM | POA: Diagnosis not present

## 2017-08-26 DIAGNOSIS — M7918 Myalgia, other site: Secondary | ICD-10-CM | POA: Diagnosis not present

## 2017-08-26 DIAGNOSIS — R350 Frequency of micturition: Secondary | ICD-10-CM | POA: Diagnosis not present

## 2017-08-26 DIAGNOSIS — Z885 Allergy status to narcotic agent status: Secondary | ICD-10-CM | POA: Diagnosis not present

## 2017-08-26 DIAGNOSIS — N3010 Interstitial cystitis (chronic) without hematuria: Secondary | ICD-10-CM | POA: Diagnosis not present

## 2017-10-04 DIAGNOSIS — N3010 Interstitial cystitis (chronic) without hematuria: Secondary | ICD-10-CM | POA: Diagnosis not present

## 2017-10-09 DIAGNOSIS — H903 Sensorineural hearing loss, bilateral: Secondary | ICD-10-CM | POA: Diagnosis not present

## 2017-10-18 DIAGNOSIS — Z23 Encounter for immunization: Secondary | ICD-10-CM | POA: Diagnosis not present

## 2017-10-18 DIAGNOSIS — H903 Sensorineural hearing loss, bilateral: Secondary | ICD-10-CM | POA: Diagnosis not present

## 2017-10-18 DIAGNOSIS — Z9181 History of falling: Secondary | ICD-10-CM | POA: Diagnosis not present

## 2017-10-18 DIAGNOSIS — Z0000 Encounter for general adult medical examination without abnormal findings: Secondary | ICD-10-CM | POA: Diagnosis not present

## 2017-10-18 DIAGNOSIS — Z1331 Encounter for screening for depression: Secondary | ICD-10-CM | POA: Diagnosis not present

## 2017-10-18 DIAGNOSIS — E785 Hyperlipidemia, unspecified: Secondary | ICD-10-CM | POA: Diagnosis not present

## 2017-11-01 DIAGNOSIS — Z23 Encounter for immunization: Secondary | ICD-10-CM | POA: Diagnosis not present

## 2017-11-01 DIAGNOSIS — E785 Hyperlipidemia, unspecified: Secondary | ICD-10-CM | POA: Diagnosis not present

## 2017-11-01 DIAGNOSIS — J309 Allergic rhinitis, unspecified: Secondary | ICD-10-CM | POA: Diagnosis not present

## 2017-11-01 DIAGNOSIS — R490 Dysphonia: Secondary | ICD-10-CM | POA: Diagnosis not present

## 2017-11-01 DIAGNOSIS — M545 Low back pain: Secondary | ICD-10-CM | POA: Diagnosis not present

## 2017-11-01 DIAGNOSIS — Z79899 Other long term (current) drug therapy: Secondary | ICD-10-CM | POA: Diagnosis not present

## 2017-11-01 DIAGNOSIS — Z1331 Encounter for screening for depression: Secondary | ICD-10-CM | POA: Diagnosis not present

## 2017-12-15 DIAGNOSIS — Z961 Presence of intraocular lens: Secondary | ICD-10-CM | POA: Diagnosis not present

## 2017-12-15 DIAGNOSIS — H52221 Regular astigmatism, right eye: Secondary | ICD-10-CM | POA: Diagnosis not present

## 2018-01-23 DIAGNOSIS — M25551 Pain in right hip: Secondary | ICD-10-CM | POA: Diagnosis not present

## 2018-01-23 DIAGNOSIS — M160 Bilateral primary osteoarthritis of hip: Secondary | ICD-10-CM | POA: Diagnosis not present

## 2018-01-23 DIAGNOSIS — S299XXA Unspecified injury of thorax, initial encounter: Secondary | ICD-10-CM | POA: Diagnosis not present

## 2018-01-23 DIAGNOSIS — R0781 Pleurodynia: Secondary | ICD-10-CM | POA: Diagnosis not present

## 2018-01-23 DIAGNOSIS — W19XXXA Unspecified fall, initial encounter: Secondary | ICD-10-CM | POA: Diagnosis not present

## 2018-01-23 DIAGNOSIS — N3010 Interstitial cystitis (chronic) without hematuria: Secondary | ICD-10-CM | POA: Diagnosis not present

## 2018-01-29 DIAGNOSIS — E785 Hyperlipidemia, unspecified: Secondary | ICD-10-CM | POA: Diagnosis not present

## 2018-01-29 DIAGNOSIS — M7072 Other bursitis of hip, left hip: Secondary | ICD-10-CM | POA: Diagnosis not present

## 2018-01-29 DIAGNOSIS — M1990 Unspecified osteoarthritis, unspecified site: Secondary | ICD-10-CM | POA: Diagnosis not present

## 2018-02-13 DIAGNOSIS — N2581 Secondary hyperparathyroidism of renal origin: Secondary | ICD-10-CM | POA: Diagnosis not present

## 2018-02-13 DIAGNOSIS — N183 Chronic kidney disease, stage 3 (moderate): Secondary | ICD-10-CM | POA: Diagnosis not present

## 2018-02-13 DIAGNOSIS — E785 Hyperlipidemia, unspecified: Secondary | ICD-10-CM | POA: Diagnosis not present

## 2018-02-13 DIAGNOSIS — I774 Celiac artery compression syndrome: Secondary | ICD-10-CM | POA: Diagnosis not present

## 2018-02-13 DIAGNOSIS — N12 Tubulo-interstitial nephritis, not specified as acute or chronic: Secondary | ICD-10-CM | POA: Diagnosis not present

## 2018-03-06 DIAGNOSIS — Z6824 Body mass index (BMI) 24.0-24.9, adult: Secondary | ICD-10-CM | POA: Diagnosis not present

## 2018-03-06 DIAGNOSIS — E785 Hyperlipidemia, unspecified: Secondary | ICD-10-CM | POA: Diagnosis not present

## 2018-03-06 DIAGNOSIS — N3010 Interstitial cystitis (chronic) without hematuria: Secondary | ICD-10-CM | POA: Diagnosis not present

## 2018-03-06 DIAGNOSIS — M1990 Unspecified osteoarthritis, unspecified site: Secondary | ICD-10-CM | POA: Diagnosis not present

## 2018-03-06 DIAGNOSIS — Z79899 Other long term (current) drug therapy: Secondary | ICD-10-CM | POA: Diagnosis not present

## 2018-03-06 DIAGNOSIS — E559 Vitamin D deficiency, unspecified: Secondary | ICD-10-CM | POA: Diagnosis not present

## 2018-04-09 DIAGNOSIS — J309 Allergic rhinitis, unspecified: Secondary | ICD-10-CM | POA: Diagnosis not present

## 2018-04-26 ENCOUNTER — Other Ambulatory Visit: Payer: Self-pay | Admitting: General Surgery

## 2018-04-26 DIAGNOSIS — Z853 Personal history of malignant neoplasm of breast: Secondary | ICD-10-CM

## 2018-04-30 ENCOUNTER — Ambulatory Visit: Payer: Medicare Other | Admitting: Physical Medicine & Rehabilitation

## 2018-05-01 ENCOUNTER — Other Ambulatory Visit: Payer: Self-pay | Admitting: General Surgery

## 2018-05-01 ENCOUNTER — Other Ambulatory Visit: Payer: Self-pay

## 2018-05-01 ENCOUNTER — Encounter: Payer: Self-pay | Admitting: Physical Medicine & Rehabilitation

## 2018-05-01 ENCOUNTER — Ambulatory Visit (HOSPITAL_BASED_OUTPATIENT_CLINIC_OR_DEPARTMENT_OTHER): Payer: Medicare Other | Admitting: Physical Medicine & Rehabilitation

## 2018-05-01 ENCOUNTER — Ambulatory Visit
Admission: RE | Admit: 2018-05-01 | Discharge: 2018-05-01 | Disposition: A | Discharge status: Home / Self Care | Payer: Medicare Other | Source: Ambulatory Visit | Attending: General Surgery | Admitting: General Surgery

## 2018-05-01 ENCOUNTER — Encounter: Payer: Medicare Other | Attending: Physical Medicine & Rehabilitation

## 2018-05-01 VITALS — BP 131/81 | HR 82 | Ht 64.0 in | Wt 139.0 lb

## 2018-05-01 DIAGNOSIS — G8929 Other chronic pain: Secondary | ICD-10-CM

## 2018-05-01 DIAGNOSIS — Z801 Family history of malignant neoplasm of trachea, bronchus and lung: Secondary | ICD-10-CM | POA: Diagnosis not present

## 2018-05-01 DIAGNOSIS — Z808 Family history of malignant neoplasm of other organs or systems: Secondary | ICD-10-CM | POA: Diagnosis not present

## 2018-05-01 DIAGNOSIS — Z90710 Acquired absence of both cervix and uterus: Secondary | ICD-10-CM | POA: Insufficient documentation

## 2018-05-01 DIAGNOSIS — Z87891 Personal history of nicotine dependence: Secondary | ICD-10-CM | POA: Insufficient documentation

## 2018-05-01 DIAGNOSIS — M25551 Pain in right hip: Secondary | ICD-10-CM | POA: Insufficient documentation

## 2018-05-01 DIAGNOSIS — Z853 Personal history of malignant neoplasm of breast: Secondary | ICD-10-CM

## 2018-05-01 DIAGNOSIS — M7061 Trochanteric bursitis, right hip: Secondary | ICD-10-CM | POA: Diagnosis not present

## 2018-05-01 DIAGNOSIS — M7062 Trochanteric bursitis, left hip: Secondary | ICD-10-CM | POA: Diagnosis not present

## 2018-05-01 DIAGNOSIS — Z96659 Presence of unspecified artificial knee joint: Secondary | ICD-10-CM | POA: Insufficient documentation

## 2018-05-01 DIAGNOSIS — Z9889 Other specified postprocedural states: Secondary | ICD-10-CM | POA: Insufficient documentation

## 2018-05-01 DIAGNOSIS — Z803 Family history of malignant neoplasm of breast: Secondary | ICD-10-CM | POA: Insufficient documentation

## 2018-05-01 DIAGNOSIS — M4322 Fusion of spine, cervical region: Secondary | ICD-10-CM | POA: Diagnosis not present

## 2018-05-01 DIAGNOSIS — Z1231 Encounter for screening mammogram for malignant neoplasm of breast: Secondary | ICD-10-CM | POA: Diagnosis not present

## 2018-05-01 NOTE — Progress Notes (Signed)
Subjective:    Patient ID: Sabrina Sullivan, female    DOB: [birth year], 77 y.o.   MRN: [id number]  HPI 77 year old female with chronic left greater than right sided hip pain.  She has had mainly lateral hip pain which has been only partially responsive to stretching of the tensor fascia lata as well as strengthening of the hip girdle musculature.  The patient has had good relief with trochanteric bursa injection and the last injection has been greater than 1 year ago. She has new or symptoms of left groin pain with ambulation.  She has had no falls or trauma to that area no prior surgeries to that area.  Her pain also occurs with prolonged standing or laying down or sitting but improves with some movement of the hip. No longer taking tizanidine or tramadol.  The tramadol makes her feel funny Pain Inventory Average Pain 6 Pain Right Now 6 My pain is sharp and aching  In the last 24 hours, has pain interfered with the following? General activity 6 Relation with others 0 Enjoyment of life 6 What TIME of day is your pain at its worst? all Sleep (in general) Fair  Pain is worse with: walking, standing and some activites Pain improves with: medication Relief from Meds: 6  Mobility ability to climb steps?  yes do you drive?  yes Do you have any goals in this area?  no  Function retired  Neuro/Psych No problems in this area  Prior Studies x-rays  Physicians involved in your care Primary care Nicholos Johns   Family History  Problem Relation Age of Onset  . Cancer Mother        uterine  . Cancer Maternal Uncle        lung  . Cancer Daughter        breast   Social History   Socioeconomic History  . Marital status: Divorced    Spouse name: Not on file  . Number of children: Not on file  . Years of education: Not on file  . Highest education level: Not on file  Occupational History  . Occupation: Retired/Disability  Social Needs  . Financial resource strain: Not on file    . Food insecurity:    Worry: Not on file    Inability: Not on file  . Transportation needs:    Medical: Not on file    Non-medical: Not on file  Tobacco Use  . Smoking status: Former Smoker    Packs/day: 0.50    Last attempt to quit: 01/22/2008    Years since quitting: 10.2  . Smokeless tobacco: Never Used  Substance and Sexual Activity  . Alcohol use: Yes  . Drug use: No  . 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 Physical activity:    Days per week: Not on file    Minutes per session: Not on file  . Stress: Not on file  Relationships  . Social connections:    Talks on phone: Not on file    Gets together: Not on file    Attends religious service: Not on file    Active member of club or organization: Not on file    Attends meetings of clubs or organizations: Not on file    Relationship status: Not on file  Other Topics Concern  . Not on file  Social History Narrative  . Not on file   Past Surgical History:  Procedure Laterality Date  .  ABDOMINAL HYSTERECTOMY  1970   has ovaries  . BOTOX INJECTION N/A 02/23/2015   Procedure: BOTOX INJECTION;  Surgeon: Garlan Fair, MD;  Location[facility];  Service: Endoscopy;  Laterality: N/A;  . BREAST LUMPECTOMY  02/02/2012   right  . CERVICAL FUSION    . ESOPHAGEAL MANOMETRY N/A 04/07/2014   Procedure: ESOPHAGEAL MANOMETRY (EM);  Surgeon: Garlan Fair, MD;  Location: [facility];  Service: Endoscopy;  Laterality: N/A;  . ESOPHAGOGASTRODUODENOSCOPY N/A 02/23/2015   Procedure: ESOPHAGOGASTRODUODENOSCOPY (EGD);  Surgeon: Garlan Fair, MD;  Location: [facility];  Service: Endoscopy;  Laterality: N/A;  . JOINT REPLACEMENT    . REPLACEMENT TOTAL KNEE    . ROTATOR CUFF REPAIR    . TONSILLECTOMY     Past Medical History:  Diagnosis Date  . Arthritis 01/27/2012   OSTEOARTHRITIS  . Breast cancer ([place])   . Cancer [facility])    breast- right  . Complication of anesthesia 2006   POST OP  HALLUCINATIONS AND CONFUSIION /"MAYBE SOMETHING FOR PAIN "  . Dental crowns present   . Ear disorder    TINNITIS  . GERD (gastroesophageal reflux disease)   . High cholesterol   . History of radiation therapy 03/22/12-04/19/12   right breast  . Left arm pain   . Low back pain   . Neck pain   . Personal history of radiation therapy   . Rosacea   . Thoracic back pain   . Urine frequency    AND PT GETS UP AT NIGHT   BP 131/81   Pulse 82   Ht 5' 4"  (1.626 m)   Wt 139 lb (63 kg)   SpO2 97%   BMI 23.86 kg/m   Opioid Risk Score:   Fall Risk Score:  `1  Depression screen PHQ 2/9  Depression screen [facility] 2/9 05/01/2018 03/24/2016 02/26/2016 07/14/2015  Decreased Interest 0 0 2 1  Down, Depressed, Hopeless 0 0 1 1  PHQ - 2 Score 0 0 3 2  Altered sleeping - - 1 0  Tired, decreased energy - - 1 0  Change in appetite - - 0 0  Feeling bad or failure about yourself  - - 0 0  Trouble concentrating - - 0 0  Moving slowly or fidgety/restless - - 0 0  Suicidal thoughts - - 0 0  PHQ-9 Score - - 5 2  Difficult doing work/chores - - - Not difficult at all  Some recent data might be hidden    Review of Systems  Constitutional: Negative.   HENT: Negative.   Eyes: Negative.   Respiratory: Negative.   Cardiovascular: Negative.   Gastrointestinal: Negative.   Endocrine: Negative.   Genitourinary: Negative.   Musculoskeletal: Negative.   Skin: Negative.   Allergic/Immunologic: Negative.   Neurological: Negative.   Hematological: Negative.   Psychiatric/Behavioral: Negative.   All other systems reviewed and are negative.      Objective:   Physical Exam  Constitutional: She is oriented to person, place, and time. She appears well-developed and well-nourished. No distress.  HENT:  Head: Normocephalic and atraumatic.  Eyes: Pupils are equal, round, and reactive to light. EOM are normal.  Neck: Normal range of motion. Neck supple.  Musculoskeletal:  Patient has normal internal and  external rotation at bilateral hips.  She has normal flexion and extension. She ambulates without evidence of antalgia. She has tenderness to palpation over the left lateral hip over the greater trochanter. Mild tenderness in the lumbar paraspinal muscles  and over the PSIS area she has moderate tenderness over the piriformis area/gluteus medius on the left side.   Neurological: She is alert and oriented to person, place, and time. Coordination and gait normal.  Negative straight leg raising Motor strength is 5/5 bilateral hip flexor knee extensor ankle dorsiflexor.  Skin: Skin is warm and dry. She is not diaphoretic.  Nursing note and vitals reviewed.         Assessment & Plan:  1.  Chronic left hip girdle pain multifactorial.  She has known history of trochanteric bursitis which I think is flared up once again on the left side.  She has difficulty sleeping on that side.  She has been performing her exercises.  I do think it is reasonable for her to undergo repeat injection.  Given her age I would not recommend chronic NSAIDs  LEFT Trochanteric bursa injection without ultrasound guidance  Indication Trochanteric bursitis. Exam has tenderness over the greater trochanter of the hip. Pain has not responded to conservative care such as exercise therapy and oral medications. Pain interferes with sleep or with mobility Informed consent was obtained after describing risks and benefits of the procedure with the patient these include bleeding bruising and infection. Patient has signed written consent form. Patient placed in a lateral decubitus position with the affected hip superior. Point of maximal pain was palpated marked and prepped with Betadine and entered with a needle to bone contact. Needle slightly withdrawn then 72m of betamethasone with 4 cc 1% lidocaine were injected. Patient tolerated procedure well. Post procedure instructions given.   In addition the patient has some new or groin  pain which sounds suspicious for hip OA.  She had an x-ray performed about 6 years ago which was showing only mild degenerative changes in both hips.  Her range of motion appears normal in the left hip.  She does have some pain in the lateral hip as well as the anterior hip with Faber's maneuver. Discussed repeating x-rays.  She would like to do this if a repeat hip injection is not helpful.

## 2018-05-01 NOTE — Patient Instructions (Addendum)
Please call if pain does not improve, will order Xray

## 2018-05-09 ENCOUNTER — Ambulatory Visit
Admission: RE | Admit: 2018-05-09 | Discharge: 2018-05-09 | Disposition: A | Discharge status: Home / Self Care | Payer: Medicare Other | Source: Ambulatory Visit | Attending: Physical Medicine & Rehabilitation | Admitting: Physical Medicine & Rehabilitation

## 2018-05-09 DIAGNOSIS — G8929 Other chronic pain: Secondary | ICD-10-CM

## 2018-05-09 DIAGNOSIS — M1612 Unilateral primary osteoarthritis, left hip: Secondary | ICD-10-CM | POA: Diagnosis not present

## 2018-05-09 DIAGNOSIS — M25551 Pain in right hip: Principal | ICD-10-CM

## 2018-05-14 ENCOUNTER — Telehealth: Payer: Self-pay

## 2018-05-14 NOTE — Telephone Encounter (Signed)
Patient calling for xray results  °

## 2018-05-16 NOTE — Telephone Encounter (Signed)
Pt is calling again asking for the results of hip xray. Can you please look at results in the absence of Dr. Letta Pate.

## 2018-05-16 NOTE — Telephone Encounter (Signed)
Return Ms. Dillie call, X-ray results reviewed.

## 2018-05-21 NOTE — Telephone Encounter (Signed)
I am not clear did the patient get a call back from [place]?

## 2018-05-22 NOTE — Telephone Encounter (Signed)
Yes she did!

## 2018-06-06 DIAGNOSIS — Z1339 Encounter for screening examination for other mental health and behavioral disorders: Secondary | ICD-10-CM | POA: Diagnosis not present

## 2018-06-06 DIAGNOSIS — E785 Hyperlipidemia, unspecified: Secondary | ICD-10-CM | POA: Diagnosis not present

## 2018-06-06 DIAGNOSIS — R490 Dysphonia: Secondary | ICD-10-CM | POA: Diagnosis not present

## 2018-06-06 DIAGNOSIS — N289 Disorder of kidney and ureter, unspecified: Secondary | ICD-10-CM | POA: Diagnosis not present

## 2018-06-06 DIAGNOSIS — M26621 Arthralgia of right temporomandibular joint: Secondary | ICD-10-CM | POA: Diagnosis not present

## 2018-06-25 DIAGNOSIS — H6123 Impacted cerumen, bilateral: Secondary | ICD-10-CM | POA: Diagnosis not present

## 2018-08-01 DIAGNOSIS — R229 Localized swelling, mass and lump, unspecified: Secondary | ICD-10-CM | POA: Diagnosis not present

## 2018-08-01 DIAGNOSIS — E559 Vitamin D deficiency, unspecified: Secondary | ICD-10-CM | POA: Diagnosis not present

## 2018-08-01 DIAGNOSIS — R490 Dysphonia: Secondary | ICD-10-CM | POA: Diagnosis not present

## 2018-08-01 DIAGNOSIS — Z79899 Other long term (current) drug therapy: Secondary | ICD-10-CM | POA: Diagnosis not present

## 2018-08-01 DIAGNOSIS — E785 Hyperlipidemia, unspecified: Secondary | ICD-10-CM | POA: Diagnosis not present

## 2018-08-01 DIAGNOSIS — J309 Allergic rhinitis, unspecified: Secondary | ICD-10-CM | POA: Diagnosis not present
